# Patient Record
Sex: Female | Born: 1965
Health system: Southern US, Community
[De-identification: ages and names within clinical notes are randomized; demographics above are authoritative.]

## PROBLEM LIST (undated history)

## (undated) DIAGNOSIS — K219 Gastro-esophageal reflux disease without esophagitis: Secondary | ICD-10-CM

## (undated) DIAGNOSIS — G5601 Carpal tunnel syndrome, right upper limb: Secondary | ICD-10-CM

## (undated) DIAGNOSIS — M1990 Unspecified osteoarthritis, unspecified site: Secondary | ICD-10-CM

## (undated) DIAGNOSIS — L409 Psoriasis, unspecified: Secondary | ICD-10-CM

## (undated) DIAGNOSIS — S82851A Displaced trimalleolar fracture of right lower leg, initial encounter for closed fracture: Secondary | ICD-10-CM

## (undated) HISTORY — PX: CHOLECYSTECTOMY: SHX55

## (undated) HISTORY — PX: BREAST SURGERY: SHX581

## (undated) HISTORY — PX: BLADDER REPAIR: SHX76

## (undated) HISTORY — PX: CARPAL TUNNEL RELEASE: SHX101

## (undated) HISTORY — PX: ABDOMINAL HYSTERECTOMY: SHX81

---

## 2011-03-20 ENCOUNTER — Encounter: Payer: Self-pay | Admitting: *Deleted

## 2011-03-20 ENCOUNTER — Emergency Department (HOSPITAL_BASED_OUTPATIENT_CLINIC_OR_DEPARTMENT_OTHER)
Admission: EM | Admit: 2011-03-20 | Discharge: 2011-03-20 | Disposition: A | Discharge status: Home / Self Care | Payer: 59 | Attending: Emergency Medicine | Admitting: Emergency Medicine

## 2011-03-20 DIAGNOSIS — R319 Hematuria, unspecified: Secondary | ICD-10-CM | POA: Insufficient documentation

## 2011-03-20 DIAGNOSIS — M549 Dorsalgia, unspecified: Secondary | ICD-10-CM

## 2011-03-20 LAB — URINALYSIS, ROUTINE W REFLEX MICROSCOPIC
Glucose, UA: NEGATIVE mg/dL
Ketones, ur: NEGATIVE mg/dL
Leukocytes, UA: NEGATIVE
Protein, ur: NEGATIVE mg/dL
pH: 6 (ref 5.0–8.0)

## 2011-03-20 LAB — URINE MICROSCOPIC-ADD ON

## 2011-03-20 MED ORDER — OXYCODONE-ACETAMINOPHEN 5-325 MG PO TABS: 1.0000 | ORAL_TABLET | ORAL | Status: AC | PRN

## 2011-03-20 NOTE — ED Provider Notes (Signed)
History     CSN: [id number] Arrival date & time: 03/20/2011  2:37 PM  Chief Complaint  Patient presents with  . Back Pain   Patient is a 45 y.o. female presenting with back pain. The history is provided by the patient.  Back Pain  This is a chronic problem. Episode onset: 5 months ago. The problem occurs constantly. The problem has been gradually worsening. The pain is associated with no known injury. The pain is present in the lumbar spine. The quality of the pain is described as aching and shooting. The pain radiates to the right thigh. The pain is moderate. The symptoms are aggravated by bending and certain positions. The pain is worse during the night. Stiffness is present in the morning. Pertinent negatives include no fever, no numbness, no leg pain, no paresthesias, no paresis, no tingling and no weakness. She has tried NSAIDs for the symptoms. The treatment provided no relief.   Pt reports she had a recent MRI ordered by a spine surgeon, was told her symptoms were "arthritis" and was recommended to do PT. Pt has not. Reports hematuria noted by her ob gyn. No urinary frequency or urgency. No fever or chills. Now bowel complaints. No numbness No weakness of legs. Able to ambulate.  History reviewed. No pertinent past medical history.  Past Surgical History  Procedure Date  . Abdominal hysterectomy   . Breast surgery   . Bladder repair     History reviewed. No pertinent family history.  History  Substance Use Topics  . Smoking status: Never Smoker   . Smokeless tobacco: Not on file  . Alcohol Use: No    OB History    Grav Para Term Preterm Abortions TAB SAB Ect Mult Living                  Review of Systems  Constitutional: Negative for fever.  Musculoskeletal: Positive for back pain.  Neurological: Negative for tingling, weakness, numbness and paresthesias.  All other systems reviewed and are negative.    Physical Exam  BP 149/89  Pulse 71  Temp(Src) 98.3 F (36.8  C) (Oral)  Resp 16  Wt 246 lb (111.585 kg)  SpO2 98%  Physical Exam  Nursing note and vitals reviewed. Constitutional: She is oriented to person, place, and time. She appears well-developed and well-nourished. No distress.  HENT:  Head: Normocephalic and atraumatic.  Eyes: EOM are normal.  Neck: Normal range of motion.  Cardiovascular: Normal rate, regular rhythm and normal heart sounds.   Pulmonary/Chest: Effort normal and breath sounds normal.  Abdominal: Soft. She exhibits no distension. There is no tenderness.  Musculoskeletal: Normal range of motion.  Neurological: She is alert and oriented to person, place, and time.       Normal strength in bilateral lower extremities with normal patellar and ankle reflexes   Skin: Skin is warm and dry.  Psychiatric: She has a normal mood and affect. Judgment normal.    ED Course  Procedures  MDM Normal lower extremity neurologic exam. No bowel or bladder complaints. No back pain red flags. Likely musculoskeletal back pain vs sciatica vs arthritis. Doubt spinal epidural abscess. Doubt cauda equina. Doubt abdominal aortic aneurysm. Will check urine given some hematuria. Doubt ureteral colic x 5 months. Likely home with spine surgery referral. Also follow up with urology given persistent hematuria   Results for orders placed during the hospital encounter of 03/20/11  URINALYSIS, ROUTINE W REFLEX MICROSCOPIC      Component Value  Range   Color, Urine YELLOW  YELLOW    Appearance CLEAR  CLEAR    Specific Gravity, Urine 1.022  1.005 - 1.030    pH 6.0  5.0 - 8.0    Glucose, UA NEGATIVE  NEGATIVE (mg/dL)   Hgb urine dipstick LARGE (*) NEGATIVE    Bilirubin Urine NEGATIVE  NEGATIVE    Ketones, ur NEGATIVE  NEGATIVE (mg/dL)   Protein, ur NEGATIVE  NEGATIVE (mg/dL)   Urobilinogen, UA 0.2  0.0 - 1.0 (mg/dL)   Nitrite NEGATIVE  NEGATIVE    Leukocytes, UA NEGATIVE  NEGATIVE   URINE MICROSCOPIC-ADD ON      Component Value Range   Squamous  Epithelial / LPF RARE  RARE    RBC / HPF 11-20  <3 (RBC/hpf)   Bacteria, UA RARE  RARE      Lyanne Co, MD 03/20/11 1555

## 2011-03-20 NOTE — ED Notes (Signed)
Pt sts "I keep having reoccurring bladder infections and I have to go all the time". Pt sts she has "gained about 9lbs in a couple of weeks and feels bloated today". Pt sts she had a MRI in April of back and told she has "arthritis". Pt sts she recently had an abdominal ultrasound and sts it was "normal".

## 2011-03-20 NOTE — ED Notes (Signed)
Pt c/o lower back pain x 5 months. MRI dx arthritis. Pt states lower back pain which radiates into right leg.

## 2012-09-17 DIAGNOSIS — Z0000 Encounter for general adult medical examination without abnormal findings: Secondary | ICD-10-CM | POA: Insufficient documentation

## 2016-09-30 ENCOUNTER — Encounter: Payer: Self-pay | Admitting: Gastroenterology

## 2016-10-02 DIAGNOSIS — Z8371 Family history of colonic polyps: Secondary | ICD-10-CM | POA: Insufficient documentation

## 2016-11-04 ENCOUNTER — Ambulatory Visit: Payer: Self-pay | Admitting: Gastroenterology

## 2017-03-26 DIAGNOSIS — K8020 Calculus of gallbladder without cholecystitis without obstruction: Secondary | ICD-10-CM | POA: Insufficient documentation

## 2017-03-31 DIAGNOSIS — Z9049 Acquired absence of other specified parts of digestive tract: Secondary | ICD-10-CM | POA: Insufficient documentation

## 2017-03-31 DIAGNOSIS — R109 Unspecified abdominal pain: Secondary | ICD-10-CM | POA: Insufficient documentation

## 2017-07-23 ENCOUNTER — Other Ambulatory Visit: Payer: Self-pay

## 2017-07-23 ENCOUNTER — Emergency Department (HOSPITAL_BASED_OUTPATIENT_CLINIC_OR_DEPARTMENT_OTHER)
Admission: EM | Admit: 2017-07-23 | Discharge: 2017-07-23 | Disposition: A | Discharge status: Home / Self Care | Payer: Self-pay | Attending: Emergency Medicine | Admitting: Emergency Medicine

## 2017-07-23 ENCOUNTER — Ambulatory Visit: Payer: Self-pay | Admitting: *Deleted

## 2017-07-23 ENCOUNTER — Encounter (HOSPITAL_BASED_OUTPATIENT_CLINIC_OR_DEPARTMENT_OTHER): Payer: Self-pay

## 2017-07-23 DIAGNOSIS — Z91040 Latex allergy status: Secondary | ICD-10-CM | POA: Insufficient documentation

## 2017-07-23 DIAGNOSIS — T7840XA Allergy, unspecified, initial encounter: Secondary | ICD-10-CM

## 2017-07-23 DIAGNOSIS — Z79899 Other long term (current) drug therapy: Secondary | ICD-10-CM | POA: Insufficient documentation

## 2017-07-23 HISTORY — DX: Psoriasis, unspecified: L40.9

## 2017-07-23 HISTORY — DX: Unspecified osteoarthritis, unspecified site: M19.90

## 2017-07-23 MED ORDER — PREDNISONE 20 MG PO TABS: 40.0000 mg | ORAL_TABLET | Freq: Every day | ORAL | 0 refills | Status: AC

## 2017-07-23 MED ORDER — PREDNISONE 10 MG PO TABS
60.0000 mg | ORAL_TABLET | Freq: Once | ORAL | Status: AC
  Administered 2017-07-23: 60 mg via ORAL
  Filled 2017-07-23: qty 1

## 2017-07-23 NOTE — Discharge Instructions (Signed)
Please read and follow all provided instructions.  Your diagnoses today include:  1. Allergic reaction, initial encounter     Tests performed today include: Vital signs. See below for your results today.   Medications prescribed:  Take as prescribed   Home care instructions:  Follow any educational materials contained in this packet.  Follow-up instructions: Please follow-up with your Dermatology provider for further evaluation of symptoms and treatment   Return instructions:  Please return to the Emergency Department if you do not get better, if you get worse, or new symptoms OR  - Fever (temperature greater than 101.4F)  - Bleeding that does not stop with holding pressure to the area    -Severe pain (please note that you may be more sore the day after your accident)  - Chest Pain  - Difficulty breathing  - Severe nausea or vomiting  - Inability to tolerate food and liquids  - Passing out  - Skin becoming red around your wounds  - Change in mental status (confusion or lethargy)  - New numbness or weakness    Please return if you have any other emergent concerns.  Additional Information:  Your vital signs today were: BP (!) 139/100 (BP Location: Right Arm)    Pulse 79    Temp 98.4 F (36.9 C) (Oral)    Resp 16    Ht 5\' 8"  (1.727 m)    Wt 107 kg (236 lb)    SpO2 100%    BMI 35.88 kg/m  If your blood pressure (BP) was elevated above 135/85 this visit, please have this repeated by your doctor within one month. ---------------

## 2017-07-23 NOTE — Telephone Encounter (Signed)
Pt called stating that she an allergic reaction to propofol in past and since then she has had scalp problems and her hair fell out; on 07/22/17 pt colored her hair (roots only) with a ppd free hair color; now she has blisters that ore oozing clear fluid on her scalp; she has been going to [facility] for 6 months but has had no relief; pt states that her "scalp is reddened and covered wit oozing blisters and that her hair looks wet because it is oozing so much"; per the patient it started last night; nurse triage initiated; per protocol pt instructed to see physician within 24 hours; she states that she would like to establish care with [facility]; due to the severity of her reaction, pt instructed to go to ED for medical evaluation; pt verbalizes understanding and will proceed to ED for evaluation; she also states that she will call back to set up appointment to establish care.    Reason for Disposition . [1] Looks infected (spreading redness, pus) AND [2] no fever  Protocols used: RASH OR REDNESS - LOCALIZED-A-AH

## 2017-07-23 NOTE — ED Provider Notes (Signed)
[facility] EMERGENCY DEPARTMENT Provider Note   CSN: [id number] Arrival date & time: 07/23/17  1605     History   Chief Complaint Chief Complaint  Patient presents with  . Allergic Reaction    HPI Dawn Mcguire is a 51 y.o. female.  HPI  51 y.o. female , presents to the Emergency Department today due to allergic reaction. Pt states reaction on scalp x 2 days. Notes hx same. Previously on steroids for help. Sees Dermatologist. States that she tried new shampoo when symptoms began. Notes swelling and oozing around posterior scalp. No redness. No fevers. No N/V. No trouble breathing or swelling to throat. Denies pain. Mainly itching. Benadryl PRN. Sees Dermatologist soon. No other symptoms noted   Past Medical History:  Diagnosis Date  . Arthritis   . Psoriasis     There are no active problems to display for this patient.   Past Surgical History:  Procedure Laterality Date  . ABDOMINAL HYSTERECTOMY    . BLADDER REPAIR    . BREAST SURGERY    . CHOLECYSTECTOMY      OB History    No data available       Home Medications    Prior to Admission medications   Medication Sig Start Date End Date Taking? Authorizing Provider  BIOTIN PO Take by mouth.   Yes [provider]  naproxen sodium (ALEVE) 220 MG tablet Take 220 mg by mouth.   Yes [provider]  Multiple Vitamin (MULTIVITAMIN) tablet Take 1 tablet by mouth daily.      [provider]  omeprazole (PRILOSEC OTC) 20 MG tablet Take 20 mg by mouth daily.      [provider]  vitamin B-12 (CYANOCOBALAMIN) 1000 MCG tablet Take 1,000 mcg by mouth daily.      [provider]    Family History No family history on file.  Social History Social History   Tobacco Use  . Smoking status: Never Smoker  . Smokeless tobacco: Never Used  Substance Use Topics  . Alcohol use: No  . Drug use: No     Allergies   Latex; Nitrofurantoin monohyd macro; Sulfa  antibiotics; Corticosteroids; and Penicillins   Review of Systems Review of Systems ROS reviewed and all are negative for acute change except as noted in the HPI.  Physical Exam Updated Vital Signs BP (!) 161/119 (BP Location: Left Arm)   Pulse 80   Temp 98.4 F (36.9 C) (Oral)   Resp 18   Ht 5\' 8"  (1.727 m)   Wt 107 kg (236 lb)   SpO2 99%   BMI 35.88 kg/m   Physical Exam  Constitutional: She is oriented to person, place, and time. She appears well-developed and well-nourished. No distress.  HENT:  Head: Normocephalic and atraumatic.  Right Ear: Tympanic membrane, external ear and ear canal normal.  Left Ear: Tympanic membrane, external ear and ear canal normal.  Nose: Nose normal.  Mouth/Throat: Uvula is midline, oropharynx is clear and moist and mucous membranes are normal. No trismus in the jaw. No oropharyngeal exudate, posterior oropharyngeal erythema or tonsillar abscesses.  Posterior scalp with mild swelling and urticarial rash. No signs of infection.   Eyes: EOM are normal. Pupils are equal, round, and reactive to light.  Neck: Normal range of motion. Neck supple. No tracheal deviation present.  Cardiovascular: Normal rate, regular rhythm, S1 normal, S2 normal, normal heart sounds, intact distal pulses and normal pulses.  Pulmonary/Chest: Effort normal and breath sounds normal.  No respiratory distress. She has no decreased breath sounds. She has no wheezes. She has no rhonchi. She has no rales.  Abdominal: Normal appearance and bowel sounds are normal. There is no tenderness.  Musculoskeletal: Normal range of motion.  Neurological: She is alert and oriented to person, place, and time.  Skin: Skin is warm and dry.  Psychiatric: She has a normal mood and affect. Her speech is normal and behavior is normal. Thought content normal.  Nursing note and vitals reviewed.    ED Treatments / Results  Labs (all labs ordered are listed, but only abnormal results are  displayed) Labs Reviewed - No data to display  EKG  EKG Interpretation None       Radiology No results found.  Procedures Procedures (including critical care time)  Medications Ordered in ED Medications - No data to display   Initial Impression / Assessment and Plan / ED Course  I have reviewed the triage vital signs and the nursing notes.  Pertinent labs & imaging results that were available during my care of the patient were reviewed by me and considered in my medical decision making (see chart for details).  Final Clinical Impressions(s) / ED Diagnoses     {I have reviewed the relevant previous healthcare records.  {I obtained HPI from historian.   ED Course:  Assessment: Pt is a 51 y.o. female presents to the Emergency Department today due to allergic reaction. Pt states reaction on scalp x 2 days. Notes hx same. Previously on steroids for help. Sees Dermatologist. States that she tried new shampoo when symptoms began. Notes swelling and oozing around posterior scalp. No redness. No fevers. No N/V. No trouble breathing or swelling to throat. Denies pain. Mainly itching. Benadryl PRN. Sees Dermatologist soon. On exam, pt in NAD. Nontoxic/nonseptic appearing. VSS. Afebrile. Lungs CTA. Heart RRR. urticarial rash on scalp. No sign of infection. Likely from new shampoo. Given prednisone in ED. Will trial steroids and follow up to Dermatologist. Plan is to DC home. At time of discharge, Patient is in no acute distress. Vital Signs are stable. Patient is able to ambulate. Patient able to tolerate PO.   Disposition/Plan:  DC Home Additional Verbal discharge instructions given and discussed with patient.  Pt Instructed to f/u with Derm in the next week for evaluation and treatment of symptoms. Return precautions given Pt acknowledges and agrees with plan  Supervising Physician Charlesetta Shanks, MD  Final diagnoses:  Allergic reaction, initial encounter    ED Discharge Orders     None       Shary Decamp, PA-C 07/23/17 1856    Charlesetta Shanks, MD 07/23/17 [phone]

## 2017-07-23 NOTE — ED Notes (Signed)
ED Provider at bedside. 

## 2017-07-23 NOTE — ED Triage Notes (Signed)
Pt sates she has been having allergic reaction to scalp since Feb 2018-outbreak to area x 2 days having coloring hair-NAD-steady gait

## 2017-07-31 DIAGNOSIS — N390 Urinary tract infection, site not specified: Secondary | ICD-10-CM | POA: Insufficient documentation

## 2017-07-31 DIAGNOSIS — R319 Hematuria, unspecified: Secondary | ICD-10-CM | POA: Diagnosis not present

## 2017-07-31 DIAGNOSIS — R300 Dysuria: Secondary | ICD-10-CM | POA: Insufficient documentation

## 2017-08-06 ENCOUNTER — Encounter: Payer: Self-pay | Admitting: Family Medicine

## 2017-08-06 ENCOUNTER — Ambulatory Visit: Payer: BLUE CROSS/BLUE SHIELD | Admitting: Family Medicine

## 2017-08-06 VITALS — BP 128/80 | HR 94 | Temp 99.0°F | Ht 68.0 in | Wt 258.0 lb

## 2017-08-06 DIAGNOSIS — M19042 Primary osteoarthritis, left hand: Secondary | ICD-10-CM

## 2017-08-06 DIAGNOSIS — K432 Incisional hernia without obstruction or gangrene: Secondary | ICD-10-CM | POA: Diagnosis not present

## 2017-08-06 DIAGNOSIS — M722 Plantar fascial fibromatosis: Secondary | ICD-10-CM | POA: Diagnosis not present

## 2017-08-06 DIAGNOSIS — M19041 Primary osteoarthritis, right hand: Secondary | ICD-10-CM

## 2017-08-06 DIAGNOSIS — L219 Seborrheic dermatitis, unspecified: Secondary | ICD-10-CM

## 2017-08-06 DIAGNOSIS — M5136 Other intervertebral disc degeneration, lumbar region: Secondary | ICD-10-CM | POA: Diagnosis not present

## 2017-08-06 DIAGNOSIS — Z0000 Encounter for general adult medical examination without abnormal findings: Secondary | ICD-10-CM

## 2017-08-06 NOTE — Progress Notes (Signed)
Subjective:  Patient ID: Dawn Mcguire, female    DOB: 1965-12-17  Age: 52 y.o. MRN: [id number]  CC: Establish Care   HPI Dawn Mcguire presents for establishment of care and for evaluation of multiple issues.  Dawn Mcguire tells of a scaling itching rash in her scalp since Dawn Mcguire was given propofol for a cholecystectomy 5 months ago.  Dawn Mcguire has tried multiple shampoos and creams for this without much relief.  Dawn Mcguire is allergic to sulfur.  Dawn Mcguire had a laparoscopic cholecystectomy as mentioned above and has had a bulge at the point of the incision sites just above her umbilicus since that surgery.  Dawn Mcguire also has a history of osteoarthritis involving her hands, degenerative disc disease in her lower back.  Dawn Mcguire also has dealt with plantar fasciitis in the past.  And orthopedist injected her plantar fascia in the distant past and this seemed to help a lot.  Dawn Mcguire works in a [employer] and stands all day.  Dawn Mcguire has been taking Aleve with relief of her symptoms.  History Dawn Mcguire has a past medical history of Arthritis and Psoriasis.   Dawn Mcguire has a past surgical history that includes Abdominal hysterectomy; Breast surgery; Bladder repair; and Cholecystectomy.   Her family history includes Arthritis in her brother, brother, brother, and mother; Cancer in her father; Diabetes in her father and son; Heart attack in her mother; High blood pressure in her mother.Dawn Mcguire reports that  has never smoked. Dawn Mcguire has never used smokeless tobacco. Dawn Mcguire reports that Dawn Mcguire does not drink alcohol or use drugs.  Outpatient Medications Prior to Visit  Medication Sig Dispense Refill  . BIOTIN PO Take by mouth.    . Multiple Vitamin (MULTIVITAMIN) tablet Take 1 tablet by mouth daily.      . naproxen sodium (ALEVE) 220 MG tablet Take 220 mg by mouth.    . vitamin B-12 (CYANOCOBALAMIN) 1000 MCG tablet Take 1,000 mcg by mouth daily.      Marland Kitchen omeprazole (PRILOSEC OTC) 20 MG tablet Take 20 mg by mouth daily.       No facility-administered medications  prior to visit.     ROS Review of Systems  Constitutional: Negative.   HENT: Negative.   Eyes: Negative.   Respiratory: Negative.   Cardiovascular: Negative.   Gastrointestinal: Negative.   Endocrine: Negative for polyphagia and polyuria.  Genitourinary: Negative.   Musculoskeletal: Positive for arthralgias, back pain and gait problem. Negative for myalgias.  Skin: Positive for rash. Negative for pallor and wound.  Allergic/Immunologic: Negative for immunocompromised state.  Neurological: Negative for weakness and headaches.  Hematological: Negative.  Does not bruise/bleed easily.  Psychiatric/Behavioral: Negative.     Objective:  BP 128/80 (BP Location: Left Arm, Patient Position: Sitting, Cuff Size: Large)   Pulse 94   Temp 99 F (37.2 C) (Oral)   Ht 5\' 8"  (1.727 m)   Wt 258 lb (117 kg)   SpO2 96%   BMI 39.23 kg/m   Physical Exam  Constitutional: Dawn Mcguire is oriented to person, place, and time. Dawn Mcguire appears well-developed and well-nourished. No distress.  HENT:  Head: Normocephalic and atraumatic.  Right Ear: External ear normal.  Left Ear: External ear normal.  Mouth/Throat: Oropharynx is clear and moist. No oropharyngeal exudate.  Eyes: Conjunctivae are normal. Pupils are equal, round, and reactive to light. Right eye exhibits no discharge. Left eye exhibits no discharge. No scleral icterus.  Neck: Neck supple. No JVD present. No tracheal deviation present. No thyromegaly present.  Cardiovascular: Normal rate, regular rhythm and  normal heart sounds.  Pulmonary/Chest: Effort normal and breath sounds normal. No stridor.  Abdominal: Soft. Bowel sounds are normal.  There are multiple well-healed surgical incisions incisions that are consistent with a past medical history of laparoscopic cholecystectomy.  There is a 2-3 cm incision just above the umbilicus.  I was not certain whether or not that I was actually feeling of bulge around this incision with Valsalva.    Lymphadenopathy:    Dawn Mcguire has no cervical adenopathy.  Neurological: Dawn Mcguire is alert and oriented to person, place, and time.  Skin: Skin is warm and dry. Dawn Mcguire is not diaphoretic.  There is a global fine scaling rash covering the patient's entire scalp.  Psychiatric: Dawn Mcguire has a normal mood and affect.      Assessment & Plan:   Dawn Mcguire was seen today for establish care.  Diagnoses and all orders for this visit:  Seborrheic eczema of scalp -     Ambulatory referral to Dermatology  Incisional hernia, without obstruction or gangrene -     Ambulatory referral to General Surgery  Primary osteoarthritis of both hands  Plantar fasciitis -     Ambulatory referral to [place] maintenance -     CBC; Future -     Comprehensive metabolic panel; Future -     Hepatitis C antibody; Future -     HIV antibody; Future -     Lipid panel; Future -     TSH; Future -     Urinalysis, Routine w reflex microscopic; Future  Degenerative disc disease, lumbar -     Ambulatory referral to Sports Medicine   I have discontinued Dawn Mcguire's omeprazole. I am also having her maintain her multivitamin, vitamin B-12, BIOTIN PO, and naproxen sodium.  No orders of the defined types were placed in this encounter.  I recommended that the patient try [facility] shampoo for her seborrheic dermatitis.  Dawn Mcguire will continue the Aleve as needed.  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  We also had a frank discussion about her weight and its effect on her lower back and feet.  Dawn Mcguire will return for the above order ordered fasting lab work.  Follow-up: No Follow-up on file.  Libby Maw, MD

## 2017-08-07 ENCOUNTER — Encounter: Payer: Self-pay | Admitting: Family Medicine

## 2017-08-13 ENCOUNTER — Ambulatory Visit: Payer: BLUE CROSS/BLUE SHIELD | Admitting: Family Medicine

## 2017-08-13 ENCOUNTER — Other Ambulatory Visit: Payer: BLUE CROSS/BLUE SHIELD

## 2017-08-18 ENCOUNTER — Ambulatory Visit: Payer: 59 | Admitting: Nurse Practitioner

## 2017-08-18 ENCOUNTER — Other Ambulatory Visit: Payer: Self-pay | Admitting: Surgery

## 2017-08-18 DIAGNOSIS — K432 Incisional hernia without obstruction or gangrene: Secondary | ICD-10-CM | POA: Diagnosis not present

## 2017-08-26 ENCOUNTER — Ambulatory Visit
Admission: RE | Admit: 2017-08-26 | Discharge: 2017-08-26 | Disposition: A | Discharge status: Home / Self Care | Payer: BLUE CROSS/BLUE SHIELD | Source: Ambulatory Visit | Attending: Surgery | Admitting: Surgery

## 2017-08-26 ENCOUNTER — Other Ambulatory Visit: Payer: Self-pay | Admitting: Surgery

## 2017-08-26 DIAGNOSIS — K432 Incisional hernia without obstruction or gangrene: Secondary | ICD-10-CM

## 2017-08-26 DIAGNOSIS — K439 Ventral hernia without obstruction or gangrene: Secondary | ICD-10-CM | POA: Diagnosis not present

## 2017-08-26 MED ORDER — IOPAMIDOL (ISOVUE-300) INJECTION 61%
125.0000 mL | Freq: Once | INTRAVENOUS | Status: AC | PRN
  Administered 2017-08-26: 125 mL via INTRAVENOUS

## 2017-09-01 ENCOUNTER — Ambulatory Visit: Payer: Self-pay | Admitting: Surgery

## 2017-09-01 NOTE — H&P (Signed)
History of Present Illness Dawn Mcguire. Dawn Bribiesca MD; 08/18/2017 1:49 PM) The patient is a 52 year old female who presents with an incisional hernia. Referred by Dr. Abelino Mcguire for ventral incisional hernia  This is a 52 year old female who is status post laparoscopic cholecystectomy on 03/26/17. The surgery was performed at [facility] by Dr. Margurite Mcguire. Upon reviewing his operative note, there did not seem to be any unusual findings at the time of surgery. Pathology confirmed chronic calculus cholecystitis with a single gallstone measuring 2.7 cm. The patient was kept overnight and was discharged on postoperative day #1. Apparently, the patient had significant postoperative pain management issues. She was readmitted to the hospital on 03/31/17 which was postoperative day #5 with low-grade fever and leukocytosis. CT scan showed a fluid collection in the gallbladder fossa. This did not seem to be definitive for a bile leak but the patient was admitted to the hospital and placed on IV antibiotics. Her white count decreased. Her abdominal pain improved. She was discharged home on 04/02/17. Her abdominal pain has since resolved. However since surgery, she has developed a palpable bulge in her abdomen above her umbilicus. This has become larger and is causing some discomfort. The patient does not want to go back to see her original surgeon and she felt that they did not address her complaints when she pointed out the palpable bulge above her umbilicus. She had superficial wound infections with several sutures that had to be removed from her laparoscopic incisions. These have all healed. She denies any obstructive complaints. She has not had any imaging of this area. She did have a reaction that she believes was an allergic reaction to propofol.  The patient states that she does not want to have any more of her care in the [facility] regional system.  We obtained a CT scan that showed only  a single periumbilical ventral hernia. CLINICAL DATA:  Pain and tenderness, incisional hernia  EXAM: CT ABDOMEN AND PELVIS WITH CONTRAST  TECHNIQUE: Multidetector CT imaging of the abdomen and pelvis was performed using the standard protocol following bolus administration of intravenous contrast.  CONTRAST:  126mL ISOVUE-300 IOPAMIDOL (ISOVUE-300) INJECTION 61%  COMPARISON:  None.  FINDINGS: Lower chest: The lung bases are clear.  No effusion is seen.  Hepatobiliary: The liver enhances with no focal abnormality and no ductal dilatation is seen. Surgical clips are present from prior cholecystectomy.  Pancreas: Pancreas is normal in size and the pancreatic duct is not dilated.  Spleen: The spleen is unremarkable.  Adrenals/Urinary Tract: The adrenal glands appear normal. The kidneys enhance with no calculus or mass and on delayed images, the pelvocaliceal systems appear normal. The ureters are normal in caliber. The urinary bladder is not well distended but no abnormality is seen.  Stomach/Bowel: The stomach is moderately distended with oral contrast. No abnormality is noted. No small bowel distention is seen. Multiple rectosigmoid colon diverticula are noted. No diverticulitis is seen. Diverticula also are scattered throughout the more proximal colon. The terminal ileum and the appendix are unremarkable.  Vascular/Lymphatic: The abdominal aorta is normal in caliber. Only small retroperitoneal lymph nodes are present.  Reproductive: The uterus has previously been resected. No adnexal lesion is seen. No fluid is noted within the pelvis.  Other: There is a midline ventral hernia just above the umbilicus containing fat and a portion of the mid transverse colon without obstruction.  Musculoskeletal: The lumbar vertebrae are normal alignment with normal intervertebral disc spaces. Mild degenerative changes  noted at L4-5. The SI joints are well  corticated.  IMPRESSION: 1. Midline ventral hernia just above the umbilicus containing fat and a nondistended portion of the mid transverse colon. 2. Colonic diverticula primarily in the rectosigmoid colon.   Electronically Signed   By: Dawn Mcguire M.D.   On: 08/26/2017 13:48    Past Surgical History Dawn Mcguire, [place]; 08/18/2017 9:21 AM) Gallbladder Surgery - Laparoscopic  Gallbladder Surgery - Open  Hemorrhoidectomy  Hysterectomy (not due to cancer) - Partial  Mammoplasty; Reduction  Bilateral.  Diagnostic Studies History Dawn Mcguire, [place]; 08/18/2017 9:21 AM) Colonoscopy  within last year Mammogram  within last year Pap Smear  1-5 years ago  Allergies Dawn Mcguire, RMA; 08/18/2017 9:24 AM) Dawn Mcguire Drugs  Penicillins  OxyCODONE HCl (Abuse Deter) *ANALGESICS - OPIOID*  Propofol *GENERAL ANESTHETICS*  Macrobid *URINARY ANTI-INFECTIVES*  Cortisone *CORTICOSTEROIDS*  Allergies Reconciled   Medication History Dawn Mcguire, RMA; 08/18/2017 9:27 AM) TraMADol HCl (50MG  Tablet, Oral) Active. Cefdinir (300MG  Capsule, Oral) Active. MetroNIDAZOLE (500MG  Tablet, Oral) Active. Biotin (1MG  Capsule, Oral) Active. Vitamin B-12 (1000MCG Tablet, Oral) Active. Multi-Vitamin (Oral) Active. Naproxen (250MG  Tablet, Oral) Active. Medications Reconciled  Social History Dawn Mcguire, [place]; 08/18/2017 9:21 AM) Alcohol use  Occasional alcohol use. Caffeine use  Carbonated beverages, Tea. Illicit drug use  Remotely quit drug use. Tobacco use  Never smoker.  Family History Dawn Mcguire, [place]; 08/18/2017 9:21 AM) Arthritis  Mother. Cerebrovascular Accident  Father. Colon Cancer  Father. Colon Polyps  Brother. Diabetes Mellitus  Son. Rectal Cancer  Father.  Pregnancy / Birth History Dawn Mcguire, [place]; 08/18/2017 9:21 AM) Age at menarche  61 years. Age of menopause  23-50 Gravida  1 Irregular periods  Length (months) of breastfeeding   7-12 Maternal age  88-30 Para  1  Other Problems Dawn Mcguire, [place]; 08/18/2017 9:21 AM) Arthritis  Back Pain  Bladder Problems  Cholelithiasis  Gastroesophageal Reflux Disease  Kidney Stone  Umbilical Hernia Repair     Review of Systems Dawn Mcguire RMA; 08/18/2017 9:21 AM) General Present- Appetite Loss and Weight Gain. Not Present- Chills, Fatigue, Fever, Night Sweats and Weight Loss. Skin Present- Dryness and Rash. Not Present- Change in Wart/Mole, Hives, Jaundice, New Lesions, Non-Healing Wounds and Ulcer. HEENT Present- Seasonal Allergies and Wears glasses/contact lenses. Not Present- Earache, Hearing Loss, Hoarseness, Nose Bleed, Oral Ulcers, Ringing in the Ears, Sinus Pain, Sore Throat, Visual Disturbances and Yellow Eyes. Breast Not Present- Breast Mass, Breast Pain, Nipple Discharge and Skin Changes. Cardiovascular Not Present- Chest Pain, Difficulty Breathing Lying Down, Leg Cramps, Palpitations, Rapid Heart Rate, Shortness of Breath and Swelling of Extremities. Gastrointestinal Present- Abdominal Pain, Bloating, Change in Bowel Habits, Gets full quickly at meals and Hemorrhoids. Not Present- Bloody Stool, Chronic diarrhea, Constipation, Difficulty Swallowing, Excessive gas, Indigestion, Nausea, Rectal Pain and Vomiting. Female Genitourinary Present- Frequency and Urgency. Not Present- Nocturia, Painful Urination and Pelvic Pain. Musculoskeletal Present- Back Pain, Joint Pain and Swelling of Extremities. Not Present- Joint Stiffness, Muscle Pain and Muscle Weakness. Neurological Not Present- Decreased Memory, Fainting, Headaches, Numbness, Seizures, Tingling, Tremor, Trouble walking and Weakness. Psychiatric Not Present- Anxiety, Bipolar, Change in Sleep Pattern, Depression, Fearful and Frequent crying. Endocrine Present- Hair Changes. Not Present- Cold Intolerance, Excessive Hunger, Heat Intolerance, Hot flashes and New Diabetes. Hematology Not Present- Blood  Thinners, Easy Bruising, Excessive bleeding, Gland problems, HIV and Persistent Infections.  Vitals Mardene Celeste King RMA; 08/18/2017 9:23 AM) 08/18/2017 9:22 AM Weight: 259.4 lb Height: 68in Body Surface Area: 2.28 m Body Mass Index:  39.44 kg/m  Temp.: 98.45F  Pulse: 96 (Regular)  BP: 140/78 (Sitting, Left Arm, Standard)       Physical Exam Rodman Key K. Lanis Storlie MD; 08/18/2017 1:49 PM) The physical exam findings are as follows: Note:WDWN in NAD Eyes: Pupils equal, round; sclera anicteric HENT: Oral mucosa moist; good dentition Neck: No masses palpated, no thyromegaly Lungs: CTA bilaterally; normal respiratory effort CV: Regular rate and rhythm; no murmurs; extremities well-perfused with no edema Abd: +bowel sounds, soft, non-tender, no palpable organomegaly; well-healed laparoscopic incisions with no signs of infection Above the umbilical incision, there is a five cm palpable, visible mass; mildly tender; reducible when supine Skin: Warm, dry; no sign of jaundice Psychiatric - alert and oriented x 4; calm mood and affect    Assessment & Plan Rodman Key K. Ople Girgis MD; 08/18/2017 1:50 PM) VENTRAL INCISIONAL HERNIA WITHOUT OBSTRUCTION OR GANGRENE (K43.2) Current Plans  Open ventral hernia repair with mesh.  The surgical procedure has been discussed with the patient.  Potential risks, benefits, alternative treatments, and expected outcomes have been explained.  All of the patient's questions at this time have been answered.  The likelihood of reaching the patient's treatment goal is good.  The patient understand the proposed surgical procedure and wishes to proceed.   Dawn Mcguire. Georgette Dover, MD, [facility]  General/ Trauma Surgery  09/01/2017 11:39 AM

## 2017-09-09 DIAGNOSIS — M2550 Pain in unspecified joint: Secondary | ICD-10-CM | POA: Diagnosis not present

## 2017-09-09 DIAGNOSIS — L409 Psoriasis, unspecified: Secondary | ICD-10-CM | POA: Diagnosis not present

## 2017-09-09 DIAGNOSIS — L659 Nonscarring hair loss, unspecified: Secondary | ICD-10-CM | POA: Diagnosis not present

## 2017-09-09 DIAGNOSIS — D485 Neoplasm of uncertain behavior of skin: Secondary | ICD-10-CM | POA: Diagnosis not present

## 2017-09-09 DIAGNOSIS — L820 Inflamed seborrheic keratosis: Secondary | ICD-10-CM | POA: Diagnosis not present

## 2017-10-03 NOTE — Pre-Procedure Instructions (Signed)
Dawn Mcguire  10/03/2017      [place], [place] - [address] [place] [address] Phone: [phone] Fax: [phone]    Your procedure is scheduled on Thursday March 21.  Report to [facility] Admitting at 5:30 A.M.  Call this number if you have problems the morning of surgery:  [phone]   Remember:  Do not eat food or drink liquids after midnight.  **DRINK Ensure Pre-surgery drink prior to leaving home the morning of surgery.**   Take these medicines the morning of surgery with A SIP OF WATER: omeprazole (prilosec), Claritin (loratadine) if needed  7 days prior to surgery STOP taking any Aspirin(unless otherwise instructed by your surgeon), Aleve, Naproxen, Ibuprofen, Motrin, Advil, Goody's, BC's, all herbal medications, fish oil, and all vitamins    Do not wear jewelry, make-up or nail polish.  Do not wear lotions, powders, or perfumes, or deodorant.  Do not shave 48 hours prior to surgery.  Men may shave face and neck.  Do not bring valuables to the hospital.  [facility] is not responsible for any belongings or valuables.  Contacts, dentures or bridgework may not be worn into surgery.  Leave your suitcase in the car.  After surgery it may be brought to your room.  For patients admitted to the hospital, discharge time will be determined by your treatment team.  Patients discharged the day of surgery will not be allowed to drive home.   Special instructions:    [place]- Preparing For Surgery  Before surgery, you can play an important role. Because skin is not sterile, your skin needs to be as free of germs as possible. You can reduce the number of germs on your skin by washing with CHG (chlorahexidine gluconate) Soap before surgery.  CHG is an antiseptic cleaner which kills germs and bonds with the skin to continue killing germs even after washing.  Please do not use if you have an allergy to CHG or  antibacterial soaps. If your skin becomes reddened/irritated stop using the CHG.  Do not shave (including legs and underarms) for at least 48 hours prior to first CHG shower. It is OK to shave your face.  Please follow these instructions carefully.   1. Shower the NIGHT BEFORE SURGERY and the MORNING OF SURGERY with CHG.   2. If you chose to wash your hair, wash your hair first as usual with your normal shampoo.  3. After you shampoo, rinse your hair and body thoroughly to remove the shampoo.  4. Use CHG as you would any other liquid soap. You can apply CHG directly to the skin and wash gently with a scrungie or a clean washcloth.   5. Apply the CHG Soap to your body ONLY FROM THE NECK DOWN.  Do not use on open wounds or open sores. Avoid contact with your eyes, ears, mouth and genitals (private parts). Wash Face and genitals (private parts)  with your normal soap.  6. Wash thoroughly, paying special attention to the area where your surgery will be performed.  7. Thoroughly rinse your body with warm water from the neck down.  8. DO NOT shower/wash with your normal soap after using and rinsing off the CHG Soap.  9. Pat yourself dry with a CLEAN TOWEL.  10. Wear CLEAN PAJAMAS to bed the night before surgery, wear comfortable clothes the morning of surgery  11. Place CLEAN SHEETS on your bed the  night of your first shower and DO NOT SLEEP WITH PETS.    Day of Surgery: Do not apply any deodorants/lotions. Please wear clean clothes to the hospital/surgery center.      Please read over the following fact sheets that you were given. Coughing and Deep Breathing and Surgical Site Infection Prevention

## 2017-10-06 ENCOUNTER — Other Ambulatory Visit: Payer: Self-pay

## 2017-10-06 ENCOUNTER — Encounter (HOSPITAL_COMMUNITY)
Admission: RE | Admit: 2017-10-06 | Discharge: 2017-10-06 | Disposition: A | Discharge status: Home / Self Care | Payer: BLUE CROSS/BLUE SHIELD | Source: Ambulatory Visit | Attending: Surgery | Admitting: Surgery

## 2017-10-06 ENCOUNTER — Encounter (HOSPITAL_COMMUNITY): Payer: Self-pay

## 2017-10-06 DIAGNOSIS — K432 Incisional hernia without obstruction or gangrene: Secondary | ICD-10-CM | POA: Insufficient documentation

## 2017-10-06 DIAGNOSIS — Z01818 Encounter for other preprocedural examination: Secondary | ICD-10-CM | POA: Insufficient documentation

## 2017-10-06 HISTORY — DX: Gastro-esophageal reflux disease without esophagitis: K21.9

## 2017-10-06 LAB — BASIC METABOLIC PANEL
Anion gap: 10 (ref 5–15)
BUN: 9 mg/dL (ref 6–20)
CALCIUM: 8.8 mg/dL — AB (ref 8.9–10.3)
CO2: 23 mmol/L (ref 22–32)
CREATININE: 0.72 mg/dL (ref 0.44–1.00)
Chloride: 107 mmol/L (ref 101–111)
Glucose, Bld: 93 mg/dL (ref 65–99)
Potassium: 3.9 mmol/L (ref 3.5–5.1)
SODIUM: 140 mmol/L (ref 135–145)

## 2017-10-06 LAB — CBC
HCT: 45 % (ref 36.0–46.0)
Hemoglobin: 14.5 g/dL (ref 12.0–15.0)
MCH: 29.7 pg (ref 26.0–34.0)
MCHC: 32.2 g/dL (ref 30.0–36.0)
MCV: 92.2 fL (ref 78.0–100.0)
PLATELETS: 248 10*3/uL (ref 150–400)
RBC: 4.88 MIL/uL (ref 3.87–5.11)
RDW: 13.1 % (ref 11.5–15.5)
WBC: 8.8 10*3/uL (ref 4.0–10.5)

## 2017-10-06 NOTE — Pre-Procedure Instructions (Signed)
Dawn Mcguire  10/06/2017      [place], [place] - [address] [place] [address] Phone: [phone] Fax: [phone]    Your procedure is scheduled on Thursday March 21.   Report to [facility] Admitting at 5:30 A.M.             (posted surgery time 7:30a - 9a)   Call this number if you have problems the morning of surgery:  [phone]   Remember:   Do not eat food or drink liquids after midnight, Wednesday.  **DRINK Ensure Pre-surgery drink prior to leaving home the morning of surgery.**   Take these medicines the morning of surgery with A SIP OF WATER: omeprazole (prilosec), Claritin (loratadine) if needed  7 days prior to surgery STOP taking any Aspirin(unless otherwise instructed by your surgeon), Aleve, Naproxen, Ibuprofen, Motrin, Advil, Goody's, BC's, all herbal medications, fish oil, and all vitamins    Do not wear jewelry, make-up or nail polish.  Do not wear lotions, powders, or perfumes, or deodorant.  Do not shave 48 hours prior to surgery.  Do not bring valuables to the hospital.  [facility] is not responsible for any belongings or valuables.  Contacts, dentures or bridgework may not be worn into surgery.  Leave your suitcase in the car.  After surgery it may be brought to your room.  For patients admitted to the hospital, discharge time will be determined by your treatment team.  Patients discharged the day of surgery will not be allowed to drive home.   Special instructions:    [place]- Preparing For Surgery  Before surgery, you can play an important role. Because skin is not sterile, your skin needs to be as free of germs as possible. You can reduce the number of germs on your skin by washing with CHG (chlorahexidine gluconate) Soap before surgery.  CHG is an antiseptic cleaner which kills germs and bonds with the skin to continue killing germs even after washing.  Please do not  use if you have an allergy to CHG or antibacterial soaps. If your skin becomes reddened/irritated stop using the CHG.  Do not shave (including legs and underarms) for at least 48 hours prior to first CHG shower. It is OK to shave your face.  Please follow these instructions carefully.   1. Shower the NIGHT BEFORE SURGERY and the MORNING OF SURGERY with CHG.   2. If you chose to wash your hair, wash your hair first as usual with your normal shampoo.  3. After you shampoo, rinse your hair and body thoroughly to remove the shampoo.  4. Use CHG as you would any other liquid soap. You can apply CHG directly to the skin and wash gently with a scrungie or a clean washcloth.   5. Apply the CHG Soap to your body ONLY FROM THE NECK DOWN.  Do not use on open wounds or open sores. Avoid contact with your eyes, ears, mouth and genitals (private parts). Wash Face and genitals (private parts)  with your normal soap.  6. Wash thoroughly, paying special attention to the area where your surgery will be performed.  7. Thoroughly rinse your body with warm water from the neck down.  8. DO NOT shower/wash with your normal soap after using and rinsing off the CHG Soap.  9. Pat yourself dry with a CLEAN TOWEL.  10. Wear CLEAN PAJAMAS to bed the night before surgery, wear  comfortable clothes the morning of surgery  11. Place CLEAN SHEETS on your bed the night of your first shower and DO NOT SLEEP WITH PETS.    Day of Surgery: Do not apply any deodorants/lotions. Please wear clean clothes to the hospital/surgery center.      Please read over the following fact sheets that you were given. Coughing and Deep Breathing and Surgical Site Infection Prevention

## 2017-10-06 NOTE — Progress Notes (Addendum)
PCP is Dr. Ethelene Hal  [place] 07/2017 She states that prior to her gallbladder surgery, she was told she had "slight" murmur.  No further w/u or studies.  Denies CP, SOB, Palpitations. No studies have been done.  Has not been sent to a cardio.

## 2017-10-15 ENCOUNTER — Encounter (HOSPITAL_COMMUNITY): Payer: Self-pay | Admitting: Anesthesiology

## 2017-10-15 NOTE — Anesthesia Preprocedure Evaluation (Addendum)
Anesthesia Evaluation  Patient identified by MRN, date of birth, ID band Patient awake    Reviewed: Allergy & Precautions, NPO status , Patient's Chart, lab work & pertinent test results  Airway Mallampati: II       Dental no notable dental hx. (+) Teeth Intact   Pulmonary neg pulmonary ROS,    Pulmonary exam normal breath sounds clear to auscultation       Cardiovascular negative cardio ROS Normal cardiovascular exam Rhythm:Regular Rate:Normal     Neuro/Psych negative psych ROS   GI/Hepatic GERD  Medicated,  Endo/Other  Morbid obesity  Renal/GU      Musculoskeletal   Abdominal (+) + obese,   Peds  Hematology negative hematology ROS (+)   Anesthesia Other Findings   Reproductive/Obstetrics                            Anesthesia Physical Anesthesia Plan  ASA: III  Anesthesia Plan: General   Post-op Pain Management:    Induction: Intravenous  PONV Risk Score and Plan: 4 or greater and Ondansetron, Dexamethasone, Scopolamine patch - Pre-op and Midazolam  Airway Management Planned: Oral ETT  Additional Equipment:   Intra-op Plan:   Post-operative Plan: Extubation in OR  Informed Consent: I have reviewed the patients History and Physical, chart, labs and discussed the procedure including the risks, benefits and alternatives for the proposed anesthesia with the patient or authorized representative who has indicated his/her understanding and acceptance.   Dental advisory given  Plan Discussed with: CRNA and Surgeon  Anesthesia Plan Comments:        Anesthesia Quick Evaluation

## 2017-10-16 ENCOUNTER — Ambulatory Visit (HOSPITAL_COMMUNITY): Payer: BLUE CROSS/BLUE SHIELD | Admitting: Certified Registered Nurse Anesthetist

## 2017-10-16 ENCOUNTER — Encounter (HOSPITAL_COMMUNITY): Payer: Self-pay | Admitting: *Deleted

## 2017-10-16 ENCOUNTER — Encounter (HOSPITAL_COMMUNITY)
Admission: RE | Disposition: A | Discharge status: Home / Self Care | Payer: Self-pay | Source: Ambulatory Visit | Attending: Surgery

## 2017-10-16 ENCOUNTER — Ambulatory Visit (HOSPITAL_COMMUNITY)
Admission: RE | Admit: 2017-10-16 | Discharge: 2017-10-16 | Disposition: A | Discharge status: Home / Self Care | Payer: BLUE CROSS/BLUE SHIELD | Source: Ambulatory Visit | Attending: Surgery | Admitting: Surgery

## 2017-10-16 DIAGNOSIS — Z79899 Other long term (current) drug therapy: Secondary | ICD-10-CM | POA: Diagnosis not present

## 2017-10-16 DIAGNOSIS — Z881 Allergy status to other antibiotic agents status: Secondary | ICD-10-CM | POA: Insufficient documentation

## 2017-10-16 DIAGNOSIS — Z882 Allergy status to sulfonamides status: Secondary | ICD-10-CM | POA: Diagnosis not present

## 2017-10-16 DIAGNOSIS — Z91040 Latex allergy status: Secondary | ICD-10-CM | POA: Insufficient documentation

## 2017-10-16 DIAGNOSIS — K5730 Diverticulosis of large intestine without perforation or abscess without bleeding: Secondary | ICD-10-CM | POA: Insufficient documentation

## 2017-10-16 DIAGNOSIS — Z886 Allergy status to analgesic agent status: Secondary | ICD-10-CM | POA: Insufficient documentation

## 2017-10-16 DIAGNOSIS — Z6838 Body mass index (BMI) 38.0-38.9, adult: Secondary | ICD-10-CM | POA: Insufficient documentation

## 2017-10-16 DIAGNOSIS — Z880 Allergy status to penicillin: Secondary | ICD-10-CM | POA: Insufficient documentation

## 2017-10-16 DIAGNOSIS — K432 Incisional hernia without obstruction or gangrene: Secondary | ICD-10-CM | POA: Diagnosis not present

## 2017-10-16 DIAGNOSIS — Z885 Allergy status to narcotic agent status: Secondary | ICD-10-CM | POA: Insufficient documentation

## 2017-10-16 DIAGNOSIS — K219 Gastro-esophageal reflux disease without esophagitis: Secondary | ICD-10-CM | POA: Insufficient documentation

## 2017-10-16 DIAGNOSIS — K439 Ventral hernia without obstruction or gangrene: Secondary | ICD-10-CM | POA: Diagnosis not present

## 2017-10-16 HISTORY — PX: INSERTION OF MESH: SHX5868

## 2017-10-16 HISTORY — PX: VENTRAL HERNIA REPAIR: SHX424

## 2017-10-16 SURGERY — REPAIR, HERNIA, VENTRAL
Anesthesia: General | Site: Abdomen

## 2017-10-16 MED ORDER — HYDROMORPHONE HCL 1 MG/ML IJ SOLN
INTRAMUSCULAR | Status: AC
  Filled 2017-10-16: qty 1

## 2017-10-16 MED ORDER — SCOPOLAMINE 1 MG/3DAYS TD PT72
MEDICATED_PATCH | TRANSDERMAL | Status: DC | PRN
  Administered 2017-10-16: 1 via TRANSDERMAL

## 2017-10-16 MED ORDER — ROCURONIUM BROMIDE 100 MG/10ML IV SOLN
INTRAVENOUS | Status: DC | PRN
  Administered 2017-10-16: 40 mg via INTRAVENOUS
  Administered 2017-10-16: 10 mg via INTRAVENOUS

## 2017-10-16 MED ORDER — CHLORHEXIDINE GLUCONATE CLOTH 2 % EX PADS: 6.0000 | MEDICATED_PAD | Freq: Once | CUTANEOUS | Status: DC

## 2017-10-16 MED ORDER — OXYCODONE HCL 5 MG PO TABS
5.0000 mg | ORAL_TABLET | Freq: Once | ORAL | Status: AC
  Administered 2017-10-16: 5 mg via ORAL

## 2017-10-16 MED ORDER — ACETAMINOPHEN 10 MG/ML IV SOLN
INTRAVENOUS | Status: AC
  Filled 2017-10-16: qty 100

## 2017-10-16 MED ORDER — PROPOFOL 10 MG/ML IV BOLUS
INTRAVENOUS | Status: DC | PRN
  Administered 2017-10-16: 100 mg via INTRAVENOUS

## 2017-10-16 MED ORDER — OXYCODONE HCL 5 MG PO TABS
ORAL_TABLET | ORAL | Status: AC
  Filled 2017-10-16: qty 1

## 2017-10-16 MED ORDER — OXYCODONE HCL 5 MG PO TABS: 5.0000 mg | ORAL_TABLET | Freq: Four times a day (QID) | ORAL | 0 refills | Status: DC | PRN

## 2017-10-16 MED ORDER — CEFAZOLIN SODIUM-DEXTROSE 2-4 GM/100ML-% IV SOLN
2.0000 g | INTRAVENOUS | Status: AC
  Administered 2017-10-16: 2 g via INTRAVENOUS
  Filled 2017-10-16: qty 100

## 2017-10-16 MED ORDER — LIDOCAINE HCL (CARDIAC) 20 MG/ML IV SOLN
INTRAVENOUS | Status: DC | PRN
  Administered 2017-10-16: 100 mg via INTRAVENOUS

## 2017-10-16 MED ORDER — DEXAMETHASONE SODIUM PHOSPHATE 10 MG/ML IJ SOLN
INTRAMUSCULAR | Status: DC | PRN
  Administered 2017-10-16: 5 mg via INTRAVENOUS

## 2017-10-16 MED ORDER — PROPOFOL 10 MG/ML IV BOLUS
INTRAVENOUS | Status: AC
Start: 2017-10-16 — End: ?
  Filled 2017-10-16: qty 20

## 2017-10-16 MED ORDER — SUGAMMADEX SODIUM 200 MG/2ML IV SOLN
INTRAVENOUS | Status: DC | PRN
  Administered 2017-10-16: 200 mg via INTRAVENOUS

## 2017-10-16 MED ORDER — MEPERIDINE HCL 50 MG/ML IJ SOLN: 6.2500 mg | INTRAMUSCULAR | Status: DC | PRN

## 2017-10-16 MED ORDER — 0.9 % SODIUM CHLORIDE (POUR BTL) OPTIME
TOPICAL | Status: DC | PRN
  Administered 2017-10-16: 1000 mL

## 2017-10-16 MED ORDER — ACETAMINOPHEN 10 MG/ML IV SOLN
1000.0000 mg | Freq: Once | INTRAVENOUS | Status: DC | PRN
  Administered 2017-10-16: 1000 mg via INTRAVENOUS

## 2017-10-16 MED ORDER — FENTANYL CITRATE (PF) 250 MCG/5ML IJ SOLN
INTRAMUSCULAR | Status: AC
  Filled 2017-10-16: qty 5

## 2017-10-16 MED ORDER — BUPIVACAINE-EPINEPHRINE (PF) 0.25% -1:200000 IJ SOLN
INTRAMUSCULAR | Status: AC
  Filled 2017-10-16: qty 120

## 2017-10-16 MED ORDER — FENTANYL CITRATE (PF) 250 MCG/5ML IJ SOLN
INTRAMUSCULAR | Status: DC | PRN
  Administered 2017-10-16: 50 ug via INTRAVENOUS
  Administered 2017-10-16: 150 ug via INTRAVENOUS
  Administered 2017-10-16 (×4): 50 ug via INTRAVENOUS

## 2017-10-16 MED ORDER — HYDROMORPHONE HCL 1 MG/ML IJ SOLN
0.2500 mg | INTRAMUSCULAR | Status: DC | PRN
  Administered 2017-10-16 (×2): 0.5 mg via INTRAVENOUS
  Administered 2017-10-16: 1 mg via INTRAVENOUS

## 2017-10-16 MED ORDER — MIDAZOLAM HCL 5 MG/5ML IJ SOLN
INTRAMUSCULAR | Status: DC | PRN
  Administered 2017-10-16: 2 mg via INTRAVENOUS

## 2017-10-16 MED ORDER — PROMETHAZINE HCL 12.5 MG PO TABS: 12.5000 mg | ORAL_TABLET | Freq: Four times a day (QID) | ORAL | 0 refills | Status: DC | PRN

## 2017-10-16 MED ORDER — ETOMIDATE 2 MG/ML IV SOLN
INTRAVENOUS | Status: DC | PRN
  Administered 2017-10-16: 10 mg via INTRAVENOUS

## 2017-10-16 MED ORDER — LACTATED RINGERS IV SOLN
INTRAVENOUS | Status: DC | PRN
  Administered 2017-10-16: 07:00:00 via INTRAVENOUS

## 2017-10-16 MED ORDER — MIDAZOLAM HCL 2 MG/2ML IJ SOLN
INTRAMUSCULAR | Status: AC
  Filled 2017-10-16: qty 2

## 2017-10-16 MED ORDER — BUPIVACAINE-EPINEPHRINE 0.25% -1:200000 IJ SOLN
INTRAMUSCULAR | Status: DC | PRN
  Administered 2017-10-16: 20 mL

## 2017-10-16 MED ORDER — PROMETHAZINE HCL 25 MG/ML IJ SOLN: 6.2500 mg | INTRAMUSCULAR | Status: DC | PRN

## 2017-10-16 MED ORDER — GABAPENTIN 300 MG PO CAPS
300.0000 mg | ORAL_CAPSULE | ORAL | Status: AC
  Administered 2017-10-16: 300 mg via ORAL
  Filled 2017-10-16: qty 1

## 2017-10-16 MED ORDER — ONDANSETRON HCL 4 MG/2ML IJ SOLN
INTRAMUSCULAR | Status: DC | PRN
  Administered 2017-10-16: 4 mg via INTRAVENOUS

## 2017-10-16 MED ORDER — ACETAMINOPHEN 500 MG PO TABS
1000.0000 mg | ORAL_TABLET | ORAL | Status: AC
  Administered 2017-10-16: 1000 mg via ORAL
  Filled 2017-10-16: qty 2

## 2017-10-16 SURGICAL SUPPLY — 35 items
BENZOIN TINCTURE PRP APPL 2/3 (GAUZE/BANDAGES/DRESSINGS) ×2 IMPLANT
BLADE CLIPPER SURG (BLADE) IMPLANT
CANISTER SUCT 3000ML PPV (MISCELLANEOUS) ×2 IMPLANT
CHLORAPREP W/TINT 26ML (MISCELLANEOUS) IMPLANT
COVER SURGICAL LIGHT HANDLE (MISCELLANEOUS) ×2 IMPLANT
DRAPE LAPAROSCOPIC ABDOMINAL (DRAPES) ×2 IMPLANT
DRSG TEGADERM 4X4.75 (GAUZE/BANDAGES/DRESSINGS) ×2 IMPLANT
ELECT BLADE 4.0 EZ CLEAN MEGAD (MISCELLANEOUS) ×2
ELECT CAUTERY BLADE 6.4 (BLADE) ×2 IMPLANT
ELECT REM PT RETURN 9FT ADLT (ELECTROSURGICAL) ×2
ELECTRODE BLDE 4.0 EZ CLN MEGD (MISCELLANEOUS) ×1 IMPLANT
ELECTRODE REM PT RTRN 9FT ADLT (ELECTROSURGICAL) ×1 IMPLANT
GAUZE SPONGE 4X4 12PLY STRL (GAUZE/BANDAGES/DRESSINGS) ×2 IMPLANT
GLOVE BIO SURGEON STRL SZ7 (GLOVE) ×2 IMPLANT
GLOVE BIOGEL PI IND STRL 7.5 (GLOVE) ×1 IMPLANT
GLOVE BIOGEL PI INDICATOR 7.5 (GLOVE) ×1
GOWN STRL REUS W/ TWL LRG LVL3 (GOWN DISPOSABLE) ×2 IMPLANT
GOWN STRL REUS W/TWL LRG LVL3 (GOWN DISPOSABLE) ×2
KIT BASIN OR (CUSTOM PROCEDURE TRAY) ×2 IMPLANT
KIT ROOM TURNOVER OR (KITS) ×2 IMPLANT
MESH VENTRALEX ST 8CM LRG (Mesh General) ×2 IMPLANT
NEEDLE HYPO 25GX1X1/2 BEV (NEEDLE) ×2 IMPLANT
NS IRRIG 1000ML POUR BTL (IV SOLUTION) ×2 IMPLANT
PACK GENERAL/GYN (CUSTOM PROCEDURE TRAY) ×2 IMPLANT
PAD ARMBOARD 7.5X6 YLW CONV (MISCELLANEOUS) ×4 IMPLANT
STRIP CLOSURE SKIN 1/2X4 (GAUZE/BANDAGES/DRESSINGS) ×2 IMPLANT
SUT MNCRL AB 4-0 PS2 18 (SUTURE) ×2 IMPLANT
SUT NOVA NAB GS-21 0 18 T12 DT (SUTURE) ×4 IMPLANT
SUT NOVA NAB GS-21 1 T12 (SUTURE) ×2 IMPLANT
SUT VIC AB 3-0 SH 27 (SUTURE) ×1
SUT VIC AB 3-0 SH 27XBRD (SUTURE) ×1 IMPLANT
SYR CONTROL 10ML LL (SYRINGE) ×2 IMPLANT
TOWEL OR 17X24 6PK STRL BLUE (TOWEL DISPOSABLE) ×2 IMPLANT
TOWEL OR 17X26 10 PK STRL BLUE (TOWEL DISPOSABLE) ×2 IMPLANT
TRAY FOLEY W/METER SILVER 14FR (SET/KITS/TRAYS/PACK) IMPLANT

## 2017-10-16 NOTE — Anesthesia Postprocedure Evaluation (Signed)
Anesthesia Post Note  Patient: Dawn Mcguire  Procedure(s) Performed: OPEN VENTRAL HERNIA REPAIR WITH MESH (N/A Abdomen) INSERTION OF MESH (N/A Abdomen)     Patient location during evaluation: PACU Anesthesia Type: General Level of consciousness: awake Pain management: pain level controlled Vital Signs Assessment: post-procedure vital signs reviewed and stable Respiratory status: spontaneous breathing Cardiovascular status: stable Postop Assessment: no apparent nausea or vomiting Anesthetic complications: no    Last Vitals:  Vitals:   10/16/17 0958 10/16/17 1015  BP:  122/74  Pulse: (!) 50 (!) 50  Resp: (!) 9 (!) 8  Temp:    SpO2: 98% 99%    Last Pain:  Vitals:   10/16/17 1015  TempSrc:   PainSc: Asleep   Pain Goal:                 Ceara Wrightson JR,JOHN Avory Mimbs

## 2017-10-16 NOTE — Anesthesia Procedure Notes (Signed)
Procedure Name: Intubation Date/Time: 10/16/2017 8:00 AM Performed by: White, Amedeo Plenty, CRNA Pre-anesthesia Checklist: Patient identified, Emergency Drugs available, Suction available and Patient being monitored Patient Re-evaluated:Patient Re-evaluated prior to induction Oxygen Delivery Method: Circle System Utilized Preoxygenation: Pre-oxygenation with 100% oxygen Induction Type: IV induction Ventilation: Mask ventilation without difficulty Laryngoscope Size: Mac and 4 Grade View: Grade I Tube type: Oral Tube size: 7.0 mm Number of attempts: 1 Airway Equipment and Method: Stylet and Oral airway Placement Confirmation: ETT inserted through vocal cords under direct vision,  positive ETCO2 and breath sounds checked- equal and bilateral Secured at: 22 cm Tube secured with: Tape Dental Injury: Teeth and Oropharynx as per pre-operative assessment

## 2017-10-16 NOTE — Op Note (Signed)
Ventral Hernia Repair Procedure Note  Indications: Symptomatic ventral hernia above umbilicus from previous laparoscopic surgery.  Pre-operative Diagnosis: Ventral hernia  Post-operative Diagnosis: Ventral hernia - 5 cm defect  Surgeon: Maia Petties   Assistants: none  Anesthesia: General endotracheal anesthesia  ASA Class: 2  Procedure Details  The patient was seen in the Holding Room. The risks, benefits, complications, treatment options, and expected outcomes were discussed with the patient. The possibilities of reaction to medication, pulmonary aspiration, perforation of viscus, bleeding, recurrent infection, the need for additional procedures, failure to diagnose a condition, and creating a complication requiring transfusion or operation were discussed with the patient. The patient concurred with the proposed plan, giving informed consent.  The site of surgery properly noted/marked. The patient was taken to the operating room, identified as Dawn Mcguire and the procedure verified as ventral hernia repair. A Time Out was held and the above information confirmed.  The patient was placed supine.  After establishing general anesthesia, the abdomen was prepped with Chloraprep and draped in sterile fashion.  We made a vertical incision over the palpable hernia above the umbilicus. Dissection was carried down to the hernia sac located above the fascia and mobilized from surrounding structures.  Intact fascia was identified circumferentially around the defect.  The large hernia sac was excised.  We used an 8 cm Ventralex mesh and secured this to the fascia with interrupted 0 Novofil sutures.  The fascial defect was reapproximated with interrupted figure-of-8 1 Novofil sutures.  The subcutaneous tissues were irrigated.  Hemostasis was confirmed.  The skin incision was closed in layers with a 3-0 Vicryl subcutaneous suture and 4-0 monocryl subcuticular closure.  Steri-Strips were applied at the  end of the operation.    Instrument, sponge, and needle counts were correct prior to closure and at the conclusion of the case.   Findings: 4.5 cm defect  Estimated Blood Loss:  Minimal         Drains: none                      Complications:  None; patient tolerated the procedure well.         Disposition: PACU - hemodynamically stable.         Condition: stable  Imogene Burn. Georgette Dover, MD, [facility] Surgery  General/ Trauma Surgery  10/16/2017 9:17 AM

## 2017-10-16 NOTE — Transfer of Care (Signed)
Immediate Anesthesia Transfer of Care Note  Patient: Dawn Mcguire  Procedure(s) Performed: OPEN VENTRAL HERNIA REPAIR WITH MESH (N/A Abdomen) INSERTION OF MESH (N/A Abdomen)  Patient Location: PACU  Anesthesia Type:General  Level of Consciousness: awake, alert , oriented and patient cooperative  Airway & Oxygen Therapy: Patient Spontanous Breathing and Patient connected to nasal cannula oxygen  Post-op Assessment: Report given to RN and Post -op Vital signs reviewed and stable  Post vital signs: Reviewed and stable  Last Vitals:  Vitals Value Taken Time  BP 145/88 10/16/2017  9:21 AM  Temp    Pulse 57 10/16/2017  9:23 AM  Resp 6 10/16/2017  9:23 AM  SpO2 100 % 10/16/2017  9:23 AM  Vitals shown include unvalidated device data.  Last Pain:  Vitals:   10/16/17 0622  TempSrc: Oral  PainSc:          Complications: No apparent anesthesia complications

## 2017-10-16 NOTE — Discharge Instructions (Signed)
[facility], [place] [phone]   POST OP INSTRUCTIONS  Always review your discharge instruction sheet given to you by the facility where your surgery was performed.  IF YOU HAVE DISABILITY OR FAMILY LEAVE FORMS, YOU MUST BRING THEM TO THE OFFICE FOR PROCESSING.  PLEASE DO NOT GIVE THEM TO YOUR DOCTOR.  1. A prescription for pain medication may be given to you upon discharge.  Take your pain medication as prescribed, if needed.  If narcotic pain medicine is not needed, then you may take acetaminophen (Tylenol) or ibuprofen (Advil) as needed. 2. Take your usually prescribed medications unless otherwise directed. 3. If you need a refill on your pain medication, please contact your pharmacy. They will contact our office to request authorization.  Prescriptions will not be filled after 5pm or on week-ends. 4. You should follow a light diet the first few days after arrival home, such as soup and crackers, pudding, etc.unless your doctor has advised otherwise. A high-fiber, low fat diet can be resumed as tolerated.   Be sure to include lots of fluids daily. Most patients will experience some swelling and bruising on the chest and neck area.  Ice packs will help.  Swelling and bruising can take several days to resolve 5. Most patients will experience some swelling and bruising in the area of the incision. Ice pack will help. Swelling and bruising can take several days to resolve..  6. It is common to experience some constipation if taking pain medication after surgery.  Increasing fluid intake and taking a stool softener will usually help or prevent this problem from occurring.  A mild laxative (Milk of Magnesia or Miralax) should be taken according to package directions if there are no bowel movements after 48 hours. 7.  Remove the outer dressing in 48 hours.  You may have steri-strips (small skin tapes) in place directly over the incision.  These strips should be left on the skin for  7-10 days.  You may get these wet in the shower 8. ACTIVITIES:  You may resume regular (light) daily activities beginning the next day--such as daily self-care, walking, climbing stairs--gradually increasing activities as tolerated.  You may have sexual intercourse when it is comfortable.  Refrain from any heavy lifting or straining until approved by your doctor. a. You may drive when you no longer are taking prescription pain medication, you can comfortably wear a seatbelt, and you can safely maneuver your car and apply brakes b. Return to Work: ___________________________________ 48. You should see your doctor in the office for a follow-up appointment approximately two weeks after your surgery.  Make sure that you call for this appointment within a day or two after you arrive home to insure a convenient appointment time. OTHER INSTRUCTIONS:  _____________________________________________________________ _____________________________________________________________  WHEN TO CALL YOUR DOCTOR: 1. Fever over 101.0 2. Inability to urinate 3. Nausea and/or vomiting 4. Extreme swelling or bruising 5. Continued bleeding from incision. 6. Increased pain, redness, or drainage from the incision. 7. Difficulty swallowing or breathing 8. Muscle cramping or spasms. 9. Numbness or tingling in hands or feet or around lips.  The clinic staff is available to answer your questions during regular business hours.  Please dont hesitate to call and ask to speak to one of the nurses if you have concerns.  For further questions, please visit [email or web address]

## 2017-10-16 NOTE — H&P (Signed)
History of Present Illness  The patient is a 52 year old female who presents with an incisional hernia.  Referred by Dr. Abelino Derrick for ventral incisional hernia  This is a 52 year old female who is status post laparoscopic cholecystectomy on 03/26/17. The surgery was performed at [facility] by Dr. Margurite Auerbach. Upon reviewing his operative note, there did not seem to be any unusual findings at the time of surgery. Pathology confirmed chronic calculus cholecystitis with a single gallstone measuring 2.7 cm. The patient was kept overnight and was discharged on postoperative day #1. Apparently, the patient had significant postoperative pain management issues. She was readmitted to the hospital on 03/31/17 which was postoperative day #5 with low-grade fever and leukocytosis. CT scan showed a fluid collection in the gallbladder fossa. This did not seem to be definitive for a bile leak but the patient was admitted to the hospital and placed on IV antibiotics. Her white count decreased. Her abdominal pain improved. She was discharged home on 04/02/17. Her abdominal pain has since resolved. However since surgery, she has developed a palpable bulge in her abdomen above her umbilicus. This has become larger and is causing some discomfort. The patient does not want to go back to see her original surgeon and she felt that they did not address her complaints when she pointed out the palpable bulge above her umbilicus. She had superficial wound infections with several sutures that had to be removed from her laparoscopic incisions. These have all healed. She denies any obstructive complaints. She has not had any imaging of this area. She did have a reaction that she believes was an allergic reaction to propofol.  The patient states that she does not want to have any more of her care in the [facility] regional system.  We obtained a CT scan that showed only a single periumbilical ventral  hernia. CLINICAL DATA: Pain and tenderness, incisional hernia  EXAM: CT ABDOMEN AND PELVIS WITH CONTRAST  TECHNIQUE: Multidetector CT imaging of the abdomen and pelvis was performed using the standard protocol following bolus administration of intravenous contrast.  CONTRAST: 183mL ISOVUE-300 IOPAMIDOL (ISOVUE-300) INJECTION 61%  COMPARISON: None.  FINDINGS: Lower chest: The lung bases are clear. No effusion is seen.  Hepatobiliary: The liver enhances with no focal abnormality and no ductal dilatation is seen. Surgical clips are present from prior cholecystectomy.  Pancreas: Pancreas is normal in size and the pancreatic duct is not dilated.  Spleen: The spleen is unremarkable.  Adrenals/Urinary Tract: The adrenal glands appear normal. The kidneys enhance with no calculus or mass and on delayed images, the pelvocaliceal systems appear normal. The ureters are normal in caliber. The urinary bladder is not well distended but no abnormality is seen.  Stomach/Bowel: The stomach is moderately distended with oral contrast. No abnormality is noted. No small bowel distention is seen. Multiple rectosigmoid colon diverticula are noted. No diverticulitis is seen. Diverticula also are scattered throughout the more proximal colon. The terminal ileum and the appendix are unremarkable.  Vascular/Lymphatic: The abdominal aorta is normal in caliber. Only small retroperitoneal lymph nodes are present.  Reproductive: The uterus has previously been resected. No adnexal lesion is seen. No fluid is noted within the pelvis.  Other: There is a midline ventral hernia just above the umbilicus containing fat and a portion of the mid transverse colon without obstruction.  Musculoskeletal: The lumbar vertebrae are normal alignment with normal intervertebral disc spaces. Mild degenerative changes noted at L4-5. The SI joints are well corticated.  IMPRESSION: 1. Midline  ventral hernia just above the umbilicus containing fat and a nondistended portion of the mid transverse colon. 2. Colonic diverticula primarily in the rectosigmoid colon.   Electronically Signed By: Ivar Drape M.D. On: 08/26/2017 13:48    Past Surgical History  Gallbladder Surgery - Laparoscopic  Gallbladder Surgery - Open  Hemorrhoidectomy  Hysterectomy (not due to cancer) - Partial  Mammoplasty; Reduction  Bilateral.  Diagnostic Studies History  Colonoscopy  within last year Mammogram  within last year Pap Smear  1-5 years ago  Allergies  Sulfa Drugs  Penicillins  OxyCODONE HCl (Abuse Deter) *ANALGESICS - OPIOID*  Propofol *GENERAL ANESTHETICS*  Macrobid *URINARY ANTI-INFECTIVES*  Cortisone *CORTICOSTEROIDS*  Allergies Reconciled   Medication History  TraMADol HCl (50MG  Tablet, Oral) Active. Cefdinir (300MG  Capsule, Oral) Active. MetroNIDAZOLE (500MG  Tablet, Oral) Active. Biotin (1MG  Capsule, Oral) Active. Vitamin B-12 (1000MCG Tablet, Oral) Active. Multi-Vitamin (Oral) Active. Naproxen (250MG  Tablet, Oral) Active. Medications Reconciled  Social History  Alcohol use  Occasional alcohol use. Caffeine use  Carbonated beverages, Tea. Illicit drug use  Remotely quit drug use. Tobacco use  Never smoker.  Family History  Arthritis  Mother. Cerebrovascular Accident  Father. Colon Cancer  Father. Colon Polyps  Brother. Diabetes Mellitus  Son. Rectal Cancer  Father.  Pregnancy / Birth History  Age at menarche  29 years. Age of menopause  36-50 Gravida  1 Irregular periods  Length (months) of breastfeeding  7-12 Maternal age  74-30 Para  1  Other Problems Arthritis  Back Pain  Bladder Problems  Cholelithiasis  Gastroesophageal Reflux Disease  Kidney Stone  Umbilical Hernia Repair     Review of Systems  General Present- Appetite Loss and Weight Gain. Not Present- Chills, Fatigue,  Fever, Night Sweats and Weight Loss. Skin Present- Dryness and Rash. Not Present- Change in Wart/Mole, Hives, Jaundice, New Lesions, Non-Healing Wounds and Ulcer. HEENT Present- Seasonal Allergies and Wears glasses/contact lenses. Not Present- Earache, Hearing Loss, Hoarseness, Nose Bleed, Oral Ulcers, Ringing in the Ears, Sinus Pain, Sore Throat, Visual Disturbances and Yellow Eyes. Breast Not Present- Breast Mass, Breast Pain, Nipple Discharge and Skin Changes. Cardiovascular Not Present- Chest Pain, Difficulty Breathing Lying Down, Leg Cramps, Palpitations, Rapid Heart Rate, Shortness of Breath and Swelling of Extremities. Gastrointestinal Present- Abdominal Pain, Bloating, Change in Bowel Habits, Gets full quickly at meals and Hemorrhoids. Not Present- Bloody Stool, Chronic diarrhea, Constipation, Difficulty Swallowing, Excessive gas, Indigestion, Nausea, Rectal Pain and Vomiting. Female Genitourinary Present- Frequency and Urgency. Not Present- Nocturia, Painful Urination and Pelvic Pain. Musculoskeletal Present- Back Pain, Joint Pain and Swelling of Extremities. Not Present- Joint Stiffness, Muscle Pain and Muscle Weakness. Neurological Not Present- Decreased Memory, Fainting, Headaches, Numbness, Seizures, Tingling, Tremor, Trouble walking and Weakness. Psychiatric Not Present- Anxiety, Bipolar, Change in Sleep Pattern, Depression, Fearful and Frequent crying. Endocrine Present- Hair Changes. Not Present- Cold Intolerance, Excessive Hunger, Heat Intolerance, Hot flashes and New Diabetes. Hematology Not Present- Blood Thinners, Easy Bruising, Excessive bleeding, Gland problems, HIV and Persistent Infections.  Vitals  Weight: 259.4 lb Height: 68in Body Surface Area: 2.28 m Body Mass Index: 39.44 kg/m  Temp.: 98.75F  Pulse: 96 (Regular)  BP: 140/78 (Sitting, Left Arm, Standard)       Physical Exam  The physical exam findings are as follows: Note:WDWN in NAD Eyes:  Pupils equal, round; sclera anicteric HENT: Oral mucosa moist; good dentition Neck: No masses palpated, no thyromegaly Lungs: CTA bilaterally; normal respiratory effort CV: Regular rate and rhythm; no murmurs; extremities well-perfused with  no edema Abd: +bowel sounds, soft, non-tender, no palpable organomegaly; well-healed laparoscopic incisions with no signs of infection Above the umbilical incision, there is a five cm palpable, visible mass; mildly tender; reducible when supine Skin: Warm, dry; no sign of jaundice Psychiatric - alert and oriented x 4; calm mood and affect    Assessment & Plan  VENTRAL INCISIONAL HERNIA WITHOUT OBSTRUCTION OR GANGRENE (K43.2) Current Plans  Open ventral hernia repair with mesh.  The surgical procedure has been discussed with the patient.  Potential risks, benefits, alternative treatments, and expected outcomes have been explained.  All of the patient's questions at this time have been answered.  The likelihood of reaching the patient's treatment goal is good.  The patient understand the proposed surgical procedure and wishes to proceed.   Imogene Burn. Georgette Dover, MD, [facility]  General/ Trauma Surgery  10/16/2017 7:13 AM

## 2017-10-17 ENCOUNTER — Encounter (HOSPITAL_COMMUNITY): Payer: Self-pay | Admitting: Surgery

## 2017-11-23 ENCOUNTER — Telehealth: Payer: Self-pay | Admitting: Surgery

## 2017-11-23 NOTE — Telephone Encounter (Signed)
Dawn Mcguire had a ventral hernia repair by Dr. Georgette Dover on 10/16/2017.  She was doing well until she developed increasing redness around her incision.  She is not running a fever.  The wound had been doing well until the last few days.  I told her to put warm soaks on the wound and check with our office tomorrow AM.  Alphonsa Overall, MD, [facility] Pager: [phone] Office phone:  [phone]

## 2017-11-24 DIAGNOSIS — L409 Psoriasis, unspecified: Secondary | ICD-10-CM | POA: Diagnosis not present

## 2017-11-24 DIAGNOSIS — L659 Nonscarring hair loss, unspecified: Secondary | ICD-10-CM | POA: Diagnosis not present

## 2017-11-25 ENCOUNTER — Other Ambulatory Visit: Payer: Self-pay | Admitting: Student

## 2017-11-25 ENCOUNTER — Ambulatory Visit
Admission: RE | Admit: 2017-11-25 | Discharge: 2017-11-25 | Disposition: A | Discharge status: Home / Self Care | Payer: BLUE CROSS/BLUE SHIELD | Source: Ambulatory Visit | Attending: Student | Admitting: Student

## 2017-11-25 DIAGNOSIS — K432 Incisional hernia without obstruction or gangrene: Secondary | ICD-10-CM

## 2017-11-25 DIAGNOSIS — N200 Calculus of kidney: Secondary | ICD-10-CM | POA: Diagnosis not present

## 2017-11-25 MED ORDER — IOPAMIDOL (ISOVUE-300) INJECTION 61%
100.0000 mL | Freq: Once | INTRAVENOUS | Status: AC | PRN
  Administered 2017-11-25: 100 mL via INTRAVENOUS

## 2017-11-26 ENCOUNTER — Other Ambulatory Visit (HOSPITAL_COMMUNITY): Payer: Self-pay | Admitting: Surgery

## 2017-11-26 DIAGNOSIS — K432 Incisional hernia without obstruction or gangrene: Secondary | ICD-10-CM

## 2017-11-27 ENCOUNTER — Other Ambulatory Visit: Payer: Self-pay | Admitting: Radiology

## 2017-11-28 ENCOUNTER — Encounter (HOSPITAL_COMMUNITY): Payer: Self-pay

## 2017-11-28 ENCOUNTER — Ambulatory Visit (HOSPITAL_COMMUNITY)
Admission: RE | Admit: 2017-11-28 | Discharge: 2017-11-28 | Disposition: A | Discharge status: Home / Self Care | Payer: BLUE CROSS/BLUE SHIELD | Source: Ambulatory Visit | Attending: Surgery | Admitting: Surgery

## 2017-11-28 ENCOUNTER — Other Ambulatory Visit (HOSPITAL_COMMUNITY): Payer: Self-pay | Admitting: Surgery

## 2017-11-28 DIAGNOSIS — Z881 Allergy status to other antibiotic agents status: Secondary | ICD-10-CM | POA: Diagnosis not present

## 2017-11-28 DIAGNOSIS — Y836 Removal of other organ (partial) (total) as the cause of abnormal reaction of the patient, or of later complication, without mention of misadventure at the time of the procedure: Secondary | ICD-10-CM | POA: Insufficient documentation

## 2017-11-28 DIAGNOSIS — Z8261 Family history of arthritis: Secondary | ICD-10-CM | POA: Insufficient documentation

## 2017-11-28 DIAGNOSIS — Z9049 Acquired absence of other specified parts of digestive tract: Secondary | ICD-10-CM | POA: Diagnosis not present

## 2017-11-28 DIAGNOSIS — Z882 Allergy status to sulfonamides status: Secondary | ICD-10-CM | POA: Insufficient documentation

## 2017-11-28 DIAGNOSIS — Z79899 Other long term (current) drug therapy: Secondary | ICD-10-CM | POA: Diagnosis not present

## 2017-11-28 DIAGNOSIS — Z833 Family history of diabetes mellitus: Secondary | ICD-10-CM | POA: Diagnosis not present

## 2017-11-28 DIAGNOSIS — R110 Nausea: Secondary | ICD-10-CM | POA: Insufficient documentation

## 2017-11-28 DIAGNOSIS — T8141XA Infection following a procedure, superficial incisional surgical site, initial encounter: Secondary | ICD-10-CM | POA: Diagnosis not present

## 2017-11-28 DIAGNOSIS — Z885 Allergy status to narcotic agent status: Secondary | ICD-10-CM | POA: Insufficient documentation

## 2017-11-28 DIAGNOSIS — Z888 Allergy status to other drugs, medicaments and biological substances status: Secondary | ICD-10-CM | POA: Diagnosis not present

## 2017-11-28 DIAGNOSIS — Z90710 Acquired absence of both cervix and uterus: Secondary | ICD-10-CM | POA: Diagnosis not present

## 2017-11-28 DIAGNOSIS — Z880 Allergy status to penicillin: Secondary | ICD-10-CM | POA: Diagnosis not present

## 2017-11-28 DIAGNOSIS — Z8249 Family history of ischemic heart disease and other diseases of the circulatory system: Secondary | ICD-10-CM | POA: Insufficient documentation

## 2017-11-28 DIAGNOSIS — K219 Gastro-esophageal reflux disease without esophagitis: Secondary | ICD-10-CM | POA: Insufficient documentation

## 2017-11-28 DIAGNOSIS — L7682 Other postprocedural complications of skin and subcutaneous tissue: Secondary | ICD-10-CM | POA: Diagnosis not present

## 2017-11-28 DIAGNOSIS — L409 Psoriasis, unspecified: Secondary | ICD-10-CM | POA: Insufficient documentation

## 2017-11-28 DIAGNOSIS — K432 Incisional hernia without obstruction or gangrene: Secondary | ICD-10-CM

## 2017-11-28 LAB — BASIC METABOLIC PANEL
Anion gap: 11 (ref 5–15)
BUN: 14 mg/dL (ref 6–20)
CHLORIDE: 106 mmol/L (ref 101–111)
CO2: 23 mmol/L (ref 22–32)
CREATININE: 0.72 mg/dL (ref 0.44–1.00)
Calcium: 9.2 mg/dL (ref 8.9–10.3)
GFR calc non Af Amer: >60 mL/min (ref >60)
Glucose, Bld: 90 mg/dL (ref 65–99)
POTASSIUM: 3.8 mmol/L (ref 3.5–5.1)
Sodium: 140 mmol/L (ref 135–145)

## 2017-11-28 LAB — CBC WITH DIFFERENTIAL/PLATELET
Basophils Absolute: 0 K/uL (ref 0.0–0.1)
Basophils Relative: 0 %
Eosinophils Absolute: 0.1 K/uL (ref 0.0–0.7)
Eosinophils Relative: 1 %
HCT: 44.3 % (ref 36.0–46.0)
Hemoglobin: 14.6 g/dL (ref 12.0–15.0)
Lymphocytes Relative: 27 %
Lymphs Abs: 2.4 K/uL (ref 0.7–4.0)
MCH: 30 pg (ref 26.0–34.0)
MCHC: 33 g/dL (ref 30.0–36.0)
MCV: 91.2 fL (ref 78.0–100.0)
Monocytes Absolute: 0.5 K/uL (ref 0.1–1.0)
Monocytes Relative: 6 %
Neutro Abs: 5.8 K/uL (ref 1.7–7.7)
Neutrophils Relative %: 66 %
Platelets: 315 K/uL (ref 150–400)
RBC: 4.86 MIL/uL (ref 3.87–5.11)
RDW: 12.8 % (ref 11.5–15.5)
WBC: 8.8 K/uL (ref 4.0–10.5)

## 2017-11-28 LAB — PROTIME-INR
INR: 0.88
Prothrombin Time: 11.8 s (ref 11.4–15.2)

## 2017-11-28 MED ORDER — LIDOCAINE-EPINEPHRINE (PF) 2 %-1:200000 IJ SOLN
INTRAMUSCULAR | Status: AC
  Filled 2017-11-28: qty 20

## 2017-11-28 MED ORDER — MIDAZOLAM HCL 2 MG/2ML IJ SOLN
INTRAMUSCULAR | Status: AC | PRN
  Administered 2017-11-28: 0.5 mg via INTRAVENOUS
  Administered 2017-11-28: 1 mg via INTRAVENOUS
  Administered 2017-11-28: 0.5 mg via INTRAVENOUS

## 2017-11-28 MED ORDER — SODIUM CHLORIDE 0.9 % IV SOLN
INTRAVENOUS | Status: DC
  Administered 2017-11-28: 11:00:00 via INTRAVENOUS

## 2017-11-28 MED ORDER — FENTANYL CITRATE (PF) 100 MCG/2ML IJ SOLN
INTRAMUSCULAR | Status: AC | PRN
  Administered 2017-11-28: 25 ug via INTRAVENOUS
  Administered 2017-11-28: 50 ug via INTRAVENOUS
  Administered 2017-11-28: 25 ug via INTRAVENOUS

## 2017-11-28 MED ORDER — MIDAZOLAM HCL 2 MG/2ML IJ SOLN
INTRAMUSCULAR | Status: AC
  Filled 2017-11-28: qty 4

## 2017-11-28 MED ORDER — FENTANYL CITRATE (PF) 100 MCG/2ML IJ SOLN
INTRAMUSCULAR | Status: AC
  Filled 2017-11-28: qty 4

## 2017-11-28 NOTE — Consult Note (Signed)
Chief Complaint: Patient was seen in consultation today for image guided aspiration/possible drainage of abdominal wall collection  Referring Physician(s): Tsuei,Matthew  Supervising Physician: Sandi Mariscal  Patient Status: [facility] - Out-pt  History of Present Illness: Dawn Mcguire is a 52 y.o. female with history of laparoscopic cholecystectomy in 2018 as well as open ventral hernia repair with mesh on 10/16/2017.  She presents now with several day history of mid abdominal fullness/pressure as well as nausea.  Recent CT scan reveals subcutaneous postop fluid collection at site of prior hernia repair with thin marginal enhancement.  Request now received for image guided aspiration and possible drainage of the fluid collection.  Past Medical History:  Diagnosis Date  . Arthritis   . Carpal tunnel syndrome of left wrist   . GERD (gastroesophageal reflux disease)   . Psoriasis     Past Surgical History:  Procedure Laterality Date  . ABDOMINAL HYSTERECTOMY    . BLADDER REPAIR    . BREAST SURGERY    . CARPAL TUNNEL RELEASE     on the right wrist  . CHOLECYSTECTOMY    . INSERTION OF MESH N/A 10/16/2017   Procedure: INSERTION OF MESH;  Surgeon: Donnie Mesa, MD;  Location: [facility];  Service: General;  Laterality: N/A;  . VENTRAL HERNIA REPAIR N/A 10/16/2017   Procedure: OPEN VENTRAL HERNIA REPAIR WITH MESH;  Surgeon: Donnie Mesa, MD;  Location: [facility];  Service: General;  Laterality: N/A;    Allergies: Latex; Sulfa antibiotics; Sulfasalazine; Corticosteroids; Hydrocodone; Nitrofurantoin monohyd macro; Penicillins; Prednisone; Propofol; and Tramadol  Medications: Prior to Admission medications   Medication Sig Start Date End Date Taking? Authorizing Provider  naproxen sodium (ALEVE) 220 MG tablet Take 440 mg by mouth 2 (two) times daily.    Yes [provider]  omeprazole (PRILOSEC) 20 MG capsule Take 20 mg by mouth daily.   Yes [provider]  OVER THE  COUNTER MEDICATION Take 1 tablet by mouth daily. Viviscal Hair Growth Tablets (VITAMIN C/NIACIN/BIOTIN/CALCIUM/IRON/ZINC/AMINOMAR MARINE COMPLEX/SHARK CARTILAGE/OYSTER EXTRACT/HORSETAIL EXTRACT/MILLET SEED EXTRACT)   Yes [provider]  oxyCODONE (OXY IR/ROXICODONE) 5 MG immediate release tablet Take 1 tablet (5 mg total) by mouth every 6 (six) hours as needed for severe pain. 10/16/17  Yes Donnie Mesa, MD  Ascorbic Acid (VITAMIN C) 1000 MG tablet Take 1,000 mg by mouth daily.    [provider]  Cholecalciferol (VITAMIN D) 2000 units CAPS Take 2,000 Units by mouth daily.    [provider]  Cyanocobalamin (VITAMIN B-12) 2500 MCG SUBL Take 2,500 mcg by mouth daily.    [provider]  loratadine (CLARITIN) 10 MG tablet Take 10 mg by mouth daily as needed for allergies.    [provider]  promethazine (PHENERGAN) 12.5 MG tablet Take 1 tablet (12.5 mg total) by mouth every 6 (six) hours as needed for nausea or vomiting. 10/16/17   Donnie Mesa, MD     Family History  Problem Relation Age of Onset  . Arthritis Mother   . Heart attack Mother   . High blood pressure Mother   . Cancer Father   . Diabetes Father   . Arthritis Brother   . Arthritis Brother   . Arthritis Brother   . Diabetes Son     Social History   Socioeconomic History  . Marital status: Married    Spouse name: Not on file  . Number of children: Not on file  . Years of education: Not on file  . Highest education  level: Not on file  Occupational History  . Not on file  Social Needs  . Financial resource strain: Not on file  . Food insecurity:    Worry: Not on file    Inability: Not on file  . Transportation needs:    Medical: Not on file    Non-medical: Not on file  Tobacco Use  . Smoking status: Never Smoker  . Smokeless tobacco: Never Used  Substance and Sexual Activity  . Alcohol use: No  . Drug use: No  . Sexual activity: Not on file  Lifestyle  . Physical  activity:    Days per week: Not on file    Minutes per session: Not on file  . Stress: Not on file  Relationships  . Social connections:    Talks on phone: Not on file    Gets together: Not on file    Attends religious service: Not on file    Active member of club or organization: Not on file    Attends meetings of clubs or organizations: Not on file    Relationship status: Not on file  Other Topics Concern  . Not on file  Social History Narrative  . Not on file      Review of Systems see above; denies fever, headache, chest pain, dyspnea, cough, back pain, vomiting or abnormal bleeding.  Vital Signs: BP (!) 138/92 (BP Location: Right Arm)   Pulse 82   Temp 97.6 F (36.4 C) (Oral)   Resp 16   SpO2 100%   Physical Exam awake, alert.  Chest clear to auscultation bilaterally.  Heart with regular rate and rhythm.  Abdomen obese, soft, positive bowel sounds, periumbilical wound site with some mild erythema and tenderness to palpation.  No significant lower extremity edema. Imaging: Ct Abdomen Pelvis W Contrast  Result Date: 11/26/2017 CLINICAL DATA:  Swelling and fluid for 1 week. Prior ventral hernia repair, postoperative for 5 weeks. EXAM: CT ABDOMEN AND PELVIS WITH CONTRAST TECHNIQUE: Multidetector CT imaging of the abdomen and pelvis was performed using the standard protocol following bolus administration of intravenous contrast. CONTRAST:  174mL ISOVUE-300 IOPAMIDOL (ISOVUE-300) INJECTION 61% COMPARISON:  08/26/2017 FINDINGS: Lower chest: Unremarkable Hepatobiliary: Stable 7 mm hypodense lesion in the lateral segment left hepatic lobe on image 27/2. Cholecystectomy. Pancreas: Unremarkable Spleen: Unremarkable Adrenals/Urinary Tract: Adrenal glands normal. 1-2 mm left kidney lower pole nonobstructive renal calculus on image 41/2. Otherwise unremarkable. Stomach/Bowel: 1.9 cm periampullary duodenal diverticulum, without inflammatory findings. Sigmoid diverticulosis without active  diverticulitis. Vascular/Lymphatic: Unremarkable Reproductive: Stable 8 mm hyperdense lesion along the left side of the vaginal cuff, possibly a proteinaceous cyst. Adnexa unremarkable. Other: No supplemental non-categorized findings. Musculoskeletal: At the site of the prior hernia there is a 6.7 by 2.5 by 5.5 cm (volume = 48 cm^3) fluid collection with internal density 12 Hounsfield units, and faint and relatively thin enhancement along its margins. Underlying hernia mesh noted along the underlying omentum, without appreciable herniation around the mesh. Small bilateral groin hernias are probably direct inguinal hernias but are not entirely specific for direct versus indirect etiology. Degenerative disc disease at L3-4, L4-5, and L5-S1 with loss of disc height and potentially mild levels of foraminal impingement at L3-4 and L4-5. IMPRESSION: 1. Subcutaneous postoperative fluid collection at the site of the prior hernia repair, measuring about 48 cubic cm in volume, with thin marginal enhancement. This may simply represent a postoperative seroma or resolving postoperative hematoma. No internal gas or thick margination to further suggest abscess. The hernia  mesh is visualized in the expected location without obvious herniation of abdominal contents in the postoperative region. 2. Small bilateral groin hernias containing adipose tissue. 3. Nonobstructive 1-2 mm left kidney lower pole calculus. 4. There is potentially mild foraminal impingement at L3-4 and L4-5. 5. Sigmoid colon diverticulosis. Electronically Signed   By: Van Clines M.D.   On: 11/26/2017 09:10    Labs:  CBC: Recent Labs    10/06/17 0835 11/28/17 1121  WBC 8.8 8.8  HGB 14.5 14.6  HCT 45.0 44.3  PLT 248 315    COAGS: No results for input(s): INR, APTT in the last 8760 hours.  BMP: Recent Labs    10/06/17 0835  NA 140  K 3.9  CL 107  CO2 23  GLUCOSE 93  BUN 9  CALCIUM 8.8*  CREATININE 0.72  GFRNONAA >60  GFRAA >60      LIVER FUNCTION TESTS: No results for input(s): BILITOT, AST, ALT, ALKPHOS, PROT, ALBUMIN in the last 8760 hours.  TUMOR MARKERS: No results for input(s): AFPTM, CEA, CA199, CHROMGRNA in the last 8760 hours.  Assessment and Plan: 52 y.o. female with history of laparoscopic cholecystectomy in 2018 as well as open ventral hernia repair with mesh on 10/16/2017.  She presents now with several day history of mid abdominal fullness/pressure as well as nausea.  Recent CT scan reveals subcutaneous postop fluid collection at site of prior hernia repair with thin marginal enhancement.  Request now received for image guided aspiration and possible drainage of the fluid collection.  Details/risks of procedure, including but not limited to, internal bleeding, infection, injury to adjacent structures, discussed with patient with her understanding and consent.   Thank you for this interesting consult.  I greatly enjoyed meeting Stefannie Defeo and look forward to participating in their care.  A copy of this report was sent to the requesting provider on this date.  Electronically Signed: D. Rowe Robert, PA-C 11/28/2017, 11:38 AM   I spent a total of 20 minutes    in face to face in clinical consultation, greater than 50% of which was counseling/coordinating care for image guided aspiration and possible drainage of abdominal wall fluid collection

## 2017-11-28 NOTE — Procedures (Signed)
Pre Procedure Dx: Post-op abdominal wall fluid collection Post Procedural Dx: Same  Technically successful US guided aspiration of approximately 35 cc of serous, non-foul smelling fluid. Fluid sent to lab for analysis.   EBL: None  No immediate complications.   Ronny Bacon, MD Pager #: [phone]

## 2017-11-28 NOTE — Discharge Instructions (Signed)
Moderate Conscious Sedation, Adult, Care After These instructions provide you with information about caring for yourself after your procedure. Your health care provider may also give you more specific instructions. Your treatment has been planned according to current medical practices, but problems sometimes occur. Call your health care provider if you have any problems or questions after your procedure. What can I expect after the procedure? After your procedure, it is common:  To feel sleepy for several hours.  To feel clumsy and have poor balance for several hours.  To have poor judgment for several hours.  To vomit if you eat too soon.  Follow these instructions at home: For at least 24 hours after the procedure:   Do not: ? Participate in activities where you could fall or become injured. ? Drive. ? Use heavy machinery. ? Drink alcohol. ? Take sleeping pills or medicines that cause drowsiness. ? Make important decisions or sign legal documents. ? Take care of children on your own.  Rest. Eating and drinking  Follow the diet recommended by your health care provider.  If you vomit: ? Drink water, juice, or soup when you can drink without vomiting. ? Make sure you have little or no nausea before eating solid foods. General instructions  Have a responsible adult stay with you until you are awake and alert.  Take over-the-counter and prescription medicines only as told by your health care provider.  If you smoke, do not smoke without supervision.  Keep all follow-up visits as told by your health care provider. This is important. Contact a health care provider if:  You keep feeling nauseous or you keep vomiting.  You feel light-headed.  You develop a rash.  You have a fever. Get help right away if:  You have trouble breathing. This information is not intended to replace advice given to you by your health care provider. Make sure you discuss any questions you have  with your health care provider. Document Released: 05/05/2013 Document Revised: 12/18/2015 Document Reviewed: 11/04/2015 Elsevier Interactive Patient Education  2018 [employer].   Needle Biopsy, Care After These instructions give you information about caring for yourself after your procedure. Your doctor may also give you more specific instructions. Call your doctor if you have any problems or questions after your procedure. Follow these instructions at home:  Rest as told by your doctor.  Take medicines only as told by your doctor.  There are many different ways to close and cover the biopsy site, including stitches (sutures), skin glue, and adhesive strips. Follow instructions from your doctor about: ? How to take care of your biopsy site.  You may shower tomorrow. ? When and how you should change your bandage (dressing). ? When you should remove your dressing.  You may remove your dressing tomorrow. ? Removing whatever was used to close your biopsy site.  Check your biopsy site every day for signs of infection. Watch for: ? Redness, swelling, or pain. ? Fluid, blood, or pus. Contact a doctor if:  You have a fever.  You have redness, swelling, or pain at the biopsy site, and it lasts longer than a few days.  You have fluid, blood, or pus coming from the biopsy site.  You feel sick to your stomach (nauseous).  You throw up (vomit). Get help right away if:  You are short of breath.  You have trouble breathing.  Your chest hurts.  You feel dizzy or you pass out (faint).  You have bleeding that does  not stop with pressure or a bandage.  You cough up blood.  Your belly (abdomen) hurts. This information is not intended to replace advice given to you by your health care provider. Make sure you discuss any questions you have with your health care provider. Document Released: 06/27/2008 Document Revised: 12/21/2015 Document Reviewed: 07/11/2014 Elsevier Interactive  Patient Education  [employer].

## 2017-12-03 LAB — AEROBIC/ANAEROBIC CULTURE (SURGICAL/DEEP WOUND): CULTURE: NO GROWTH

## 2017-12-03 LAB — AEROBIC/ANAEROBIC CULTURE W GRAM STAIN (SURGICAL/DEEP WOUND): Special Requests: NORMAL

## 2017-12-26 ENCOUNTER — Other Ambulatory Visit (HOSPITAL_COMMUNITY): Payer: Self-pay | Admitting: Student

## 2017-12-26 DIAGNOSIS — K432 Incisional hernia without obstruction or gangrene: Secondary | ICD-10-CM

## 2017-12-29 ENCOUNTER — Other Ambulatory Visit: Payer: Self-pay | Admitting: Student

## 2017-12-29 ENCOUNTER — Other Ambulatory Visit (HOSPITAL_COMMUNITY): Payer: Self-pay | Admitting: Student

## 2017-12-29 DIAGNOSIS — K432 Incisional hernia without obstruction or gangrene: Secondary | ICD-10-CM

## 2017-12-30 ENCOUNTER — Other Ambulatory Visit: Payer: Self-pay | Admitting: Student

## 2017-12-31 ENCOUNTER — Encounter (HOSPITAL_COMMUNITY): Payer: Self-pay

## 2017-12-31 ENCOUNTER — Other Ambulatory Visit (HOSPITAL_COMMUNITY): Payer: Self-pay | Admitting: Student

## 2017-12-31 ENCOUNTER — Ambulatory Visit (HOSPITAL_COMMUNITY)
Admission: RE | Admit: 2017-12-31 | Discharge: 2017-12-31 | Disposition: A | Discharge status: Home / Self Care | Payer: BLUE CROSS/BLUE SHIELD | Source: Ambulatory Visit | Attending: Student | Admitting: Student

## 2017-12-31 DIAGNOSIS — L409 Psoriasis, unspecified: Secondary | ICD-10-CM | POA: Insufficient documentation

## 2017-12-31 DIAGNOSIS — X58XXXS Exposure to other specified factors, sequela: Secondary | ICD-10-CM | POA: Diagnosis not present

## 2017-12-31 DIAGNOSIS — K432 Incisional hernia without obstruction or gangrene: Secondary | ICD-10-CM

## 2017-12-31 DIAGNOSIS — Z8261 Family history of arthritis: Secondary | ICD-10-CM | POA: Insufficient documentation

## 2017-12-31 DIAGNOSIS — Z888 Allergy status to other drugs, medicaments and biological substances status: Secondary | ICD-10-CM | POA: Insufficient documentation

## 2017-12-31 DIAGNOSIS — Z79899 Other long term (current) drug therapy: Secondary | ICD-10-CM | POA: Diagnosis not present

## 2017-12-31 DIAGNOSIS — Z880 Allergy status to penicillin: Secondary | ICD-10-CM | POA: Insufficient documentation

## 2017-12-31 DIAGNOSIS — Z9049 Acquired absence of other specified parts of digestive tract: Secondary | ICD-10-CM | POA: Insufficient documentation

## 2017-12-31 DIAGNOSIS — Z882 Allergy status to sulfonamides status: Secondary | ICD-10-CM | POA: Insufficient documentation

## 2017-12-31 DIAGNOSIS — G5602 Carpal tunnel syndrome, left upper limb: Secondary | ICD-10-CM | POA: Diagnosis not present

## 2017-12-31 DIAGNOSIS — L7634 Postprocedural seroma of skin and subcutaneous tissue following other procedure: Secondary | ICD-10-CM | POA: Diagnosis not present

## 2017-12-31 DIAGNOSIS — S301XXS Contusion of abdominal wall, sequela: Secondary | ICD-10-CM | POA: Diagnosis not present

## 2017-12-31 DIAGNOSIS — Z885 Allergy status to narcotic agent status: Secondary | ICD-10-CM | POA: Insufficient documentation

## 2017-12-31 DIAGNOSIS — K219 Gastro-esophageal reflux disease without esophagitis: Secondary | ICD-10-CM | POA: Insufficient documentation

## 2017-12-31 DIAGNOSIS — M1990 Unspecified osteoarthritis, unspecified site: Secondary | ICD-10-CM | POA: Diagnosis not present

## 2017-12-31 DIAGNOSIS — Z9889 Other specified postprocedural states: Secondary | ICD-10-CM | POA: Insufficient documentation

## 2017-12-31 DIAGNOSIS — Z90710 Acquired absence of both cervix and uterus: Secondary | ICD-10-CM | POA: Insufficient documentation

## 2017-12-31 DIAGNOSIS — Z881 Allergy status to other antibiotic agents status: Secondary | ICD-10-CM | POA: Insufficient documentation

## 2017-12-31 HISTORY — PX: IR US GUIDE BX ASP/DRAIN: IMG2392

## 2017-12-31 LAB — CBC
HCT: 42.5 % (ref 36.0–46.0)
Hemoglobin: 14 g/dL (ref 12.0–15.0)
MCH: 30 pg (ref 26.0–34.0)
MCHC: 32.9 g/dL (ref 30.0–36.0)
MCV: 91 fL (ref 78.0–100.0)
Platelets: 272 10*3/uL (ref 150–400)
RBC: 4.67 MIL/uL (ref 3.87–5.11)
RDW: 12.7 % (ref 11.5–15.5)
WBC: 7.9 10*3/uL (ref 4.0–10.5)

## 2017-12-31 LAB — APTT: aPTT: 31 seconds (ref 24–36)

## 2017-12-31 LAB — PROTIME-INR
INR: 0.88
PROTHROMBIN TIME: 11.9 s (ref 11.4–15.2)

## 2017-12-31 MED ORDER — MIDAZOLAM HCL 2 MG/2ML IJ SOLN
INTRAMUSCULAR | Status: AC
  Filled 2017-12-31: qty 2

## 2017-12-31 MED ORDER — SODIUM CHLORIDE 0.9 % IV SOLN: INTRAVENOUS | Status: DC

## 2017-12-31 MED ORDER — FENTANYL CITRATE (PF) 100 MCG/2ML IJ SOLN
INTRAMUSCULAR | Status: AC
  Filled 2017-12-31: qty 2

## 2017-12-31 MED ORDER — IOPAMIDOL (ISOVUE-300) INJECTION 61%
INTRAVENOUS | Status: AC
  Filled 2017-12-31: qty 50

## 2017-12-31 MED ORDER — LIDOCAINE HCL 1 % IJ SOLN
INTRAMUSCULAR | Status: AC
  Filled 2017-12-31: qty 20

## 2017-12-31 MED ORDER — LIDOCAINE HCL (PF) 1 % IJ SOLN
INTRAMUSCULAR | Status: AC | PRN
  Administered 2017-12-31: 5 mL

## 2017-12-31 NOTE — Progress Notes (Signed)
Patient discharged home after aspiration procedure. Mid abdominal dressing site (tegaderm and gauze) clean, dry, and intact. Patient denies any pain or discomfort at site. IV d/c'd, copy of discharge instructions given to patient. No further questions or concerns by patient or husband.

## 2017-12-31 NOTE — H&P (Signed)
Chief Complaint: Patient was seen in consultation today for abdominal wall seroma  Referring Physician(s): Dr. Johney Maine  Supervising Physician: Dr. Jacqulynn Cadet  Patient Status: [facility]- outpt  History of Present Illness: Dawn Mcguire is a 52 y.o. female with history of laparoscopic cholecystectomy in 2018 as well as open ventral hernia repair with mesh on 10/16/2017.  She subsequently developed a subcutaneous postop fluid collection at site of prior hernia repair with thin marginal enhancement. The fluid collection was aspirated on 11/28/17 and revealed no infectious component.  Patient states she achieved a few weeks relief before the collection and abdominal pressure returned.   She presents today for reassessment of the seroma with possible aspiration at the request of Dr. Johney Maine.  She has been NPO.  She does not take blood thinners.       Past Medical History:  Diagnosis Date  . Arthritis   . Carpal tunnel syndrome of left wrist   . GERD (gastroesophageal reflux disease)   . Psoriasis          Past Surgical History:  Procedure Laterality Date  . ABDOMINAL HYSTERECTOMY    . BLADDER REPAIR    . BREAST SURGERY    . CARPAL TUNNEL RELEASE     on the right wrist  . CHOLECYSTECTOMY    . INSERTION OF MESH N/A 10/16/2017   Procedure: INSERTION OF MESH;  Surgeon: Donnie Mesa, MD;  Location: [place];  Service: General;  Laterality: N/A;  . VENTRAL HERNIA REPAIR N/A 10/16/2017   Procedure: OPEN VENTRAL HERNIA REPAIR WITH MESH;  Surgeon: Donnie Mesa, MD;  Location: [place];  Service: General;  Laterality: N/A;    Allergies: Latex; Sulfa antibiotics; Sulfasalazine; Corticosteroids; Hydrocodone; Nitrofurantoin monohyd macro; Penicillins; Prednisone; Propofol; and Tramadol  Medications:        Prior to Admission medications   Medication Sig Start Date End Date Taking? Authorizing Provider  naproxen sodium (ALEVE) 220 MG tablet Take 440 mg by mouth  2 (two) times daily.    Yes [provider]  omeprazole (PRILOSEC) 20 MG capsule Take 20 mg by mouth daily.   Yes [provider]  OVER THE COUNTER MEDICATION Take 1 tablet by mouth daily. Viviscal Hair Growth Tablets (VITAMIN C/NIACIN/BIOTIN/CALCIUM/IRON/ZINC/AMINOMAR MARINE COMPLEX/SHARK CARTILAGE/OYSTER EXTRACT/HORSETAIL EXTRACT/MILLET SEED EXTRACT)   Yes [provider]  oxyCODONE (OXY IR/ROXICODONE) 5 MG immediate release tablet Take 1 tablet (5 mg total) by mouth every 6 (six) hours as needed for severe pain. 10/16/17  Yes Donnie Mesa, MD  Ascorbic Acid (VITAMIN C) 1000 MG tablet Take 1,000 mg by mouth daily.    [provider]  Cholecalciferol (VITAMIN D) 2000 units CAPS Take 2,000 Units by mouth daily.    [provider]  Cyanocobalamin (VITAMIN B-12) 2500 MCG SUBL Take 2,500 mcg by mouth daily.    [provider]  loratadine (CLARITIN) 10 MG tablet Take 10 mg by mouth daily as needed for allergies.    [provider]  promethazine (PHENERGAN) 12.5 MG tablet Take 1 tablet (12.5 mg total) by mouth every 6 (six) hours as needed for nausea or vomiting. 10/16/17   Donnie Mesa, MD          Family History  Problem Relation Age of Onset  . Arthritis Mother   . Heart attack Mother   . High blood pressure Mother   . Cancer Father   . Diabetes Father   . Arthritis Brother   . Arthritis Brother   . Arthritis Brother   .  Diabetes Son     Social History        Socioeconomic History  . Marital status: Married    Spouse name: Not on file  . Number of children: Not on file  . Years of education: Not on file  . Highest education level: Not on file  Occupational History  . Not on file  Social Needs  . Financial resource strain: Not on file  . Food insecurity:    Worry: Not on file    Inability: Not on file  . Transportation needs:    Medical: Not on file    Non-medical: Not  on file  Tobacco Use  . Smoking status: Never Smoker  . Smokeless tobacco: Never Used  Substance and Sexual Activity  . Alcohol use: No  . Drug use: No  . Sexual activity: Not on file  Lifestyle  . Physical activity:    Days per week: Not on file    Minutes per session: Not on file  . Stress: Not on file  Relationships  . Social connections:    Talks on phone: Not on file    Gets together: Not on file    Attends religious service: Not on file    Active member of club or organization: Not on file    Attends meetings of clubs or organizations: Not on file    Relationship status: Not on file  Other Topics Concern  . Not on file  Social History Narrative  . Not on file      Review of Systems see above; denies fever, headache, chest pain, dyspnea, cough, back pain, vomiting or abnormal bleeding.  Vital Signs: BP (!) 138/92 (BP Location: Right Arm)   Pulse 82   Temp 97.6 F (36.4 C) (Oral)   Resp 16   SpO2 100%   Physical Exam awake, alert.  Chest clear to auscultation bilaterally.  Heart with regular rate and rhythm.  Abdomen obese, soft, positive bowel sounds, periumbilical wound site with some mild erythema and tenderness to palpation.  No significant lower extremity edema. Imaging:  ImagingResults  Ct Abdomen Pelvis W Contrast  Result Date: 11/26/2017 CLINICAL DATA:  Swelling and fluid for 1 week. Prior ventral hernia repair, postoperative for 5 weeks. EXAM: CT ABDOMEN AND PELVIS WITH CONTRAST TECHNIQUE: Multidetector CT imaging of the abdomen and pelvis was performed using the standard protocol following bolus administration of intravenous contrast. CONTRAST:  182mL ISOVUE-300 IOPAMIDOL (ISOVUE-300) INJECTION 61% COMPARISON:  08/26/2017 FINDINGS: Lower chest: Unremarkable Hepatobiliary: Stable 7 mm hypodense lesion in the lateral segment left hepatic lobe on image 27/2. Cholecystectomy. Pancreas: Unremarkable Spleen: Unremarkable Adrenals/Urinary  Tract: Adrenal glands normal. 1-2 mm left kidney lower pole nonobstructive renal calculus on image 41/2. Otherwise unremarkable. Stomach/Bowel: 1.9 cm periampullary duodenal diverticulum, without inflammatory findings. Sigmoid diverticulosis without active diverticulitis. Vascular/Lymphatic: Unremarkable Reproductive: Stable 8 mm hyperdense lesion along the left side of the vaginal cuff, possibly a proteinaceous cyst. Adnexa unremarkable. Other: No supplemental non-categorized findings. Musculoskeletal: At the site of the prior hernia there is a 6.7 by 2.5 by 5.5 cm (volume = 48 cm^3) fluid collection with internal density 12 Hounsfield units, and faint and relatively thin enhancement along its margins. Underlying hernia mesh noted along the underlying omentum, without appreciable herniation around the mesh. Small bilateral groin hernias are probably direct inguinal hernias but are not entirely specific for direct versus indirect etiology. Degenerative disc disease at L3-4, L4-5, and L5-S1 with loss of disc height and potentially mild levels of foraminal  impingement at L3-4 and L4-5. IMPRESSION: 1. Subcutaneous postoperative fluid collection at the site of the prior hernia repair, measuring about 48 cubic cm in volume, with thin marginal enhancement. This may simply represent a postoperative seroma or resolving postoperative hematoma. No internal gas or thick margination to further suggest abscess. The hernia mesh is visualized in the expected location without obvious herniation of abdominal contents in the postoperative region. 2. Small bilateral groin hernias containing adipose tissue. 3. Nonobstructive 1-2 mm left kidney lower pole calculus. 4. There is potentially mild foraminal impingement at L3-4 and L4-5. 5. Sigmoid colon diverticulosis. Electronically Signed   By: Van Clines M.D.   On: 11/26/2017 09:10     Labs: CBC    Component Value Date/Time   WBC 7.9 12/31/2017 0700   RBC 4.67  12/31/2017 0700   HGB 14.0 12/31/2017 0700   HCT 42.5 12/31/2017 0700   PLT 272 12/31/2017 0700   MCV 91.0 12/31/2017 0700   MCH 30.0 12/31/2017 0700   MCHC 32.9 12/31/2017 0700   RDW 12.7 12/31/2017 0700   LYMPHSABS 2.4 11/28/2017 1121   MONOABS 0.5 11/28/2017 1121   EOSABS 0.1 11/28/2017 1121   BASOSABS 0.0 11/28/2017 1121    Ref Range & Units 07:00 10mo ago  Prothrombin Time 11.4 - 15.2 seconds 11.9  11.8   INR  0.88  0.88 CM     Assessment and Plan: Patient with past medical history of psoriasis, GERD presents with complaint of abdominal wall seroma.  IR consulted for aspiration at the request of Dr. Johney Maine. Case reviewed by Dr. Laurence Ferrari who approves patient for procedure.  Patient presents today in their usual state of health.  She has been NPO and is not currently on blood thinners.   Risks and benefits discussed with the patient including bleeding, infection, damage to adjacent structures, and sepsis.  All of the patient's questions were answered, patient is agreeable to proceed. Consent signed and in chart.   Thank you for this interesting consult.  I greatly enjoyed meeting Dawn Mcguire and look forward to participating in their care.  A copy of this report was sent to the requesting provider on this date.  Electronically Signed: Brynda Greathouse, MS RD PA-C 8:22 AM   I spent a total of 20 minutes    in face to face in clinical consultation, greater than 50% of which was counseling/coordinating care for image guided aspiration and possible drainage of abdominal wall fluid collection

## 2017-12-31 NOTE — Discharge Instructions (Signed)
Incision and Drainage Incision and drainage is a surgical procedure to open and drain a fluid-filled sac. The sac may be filled with pus, mucus, or blood. Examples of fluid-filled sacs that may need surgical drainage include cysts, skin infections (abscesses), and red lumps that develop from a ruptured cyst or a small abscess (boils). You may need this procedure if the affected area is large, painful, infected, or not healing well. Tell a health care provider about:  Any allergies you have.  All medicines you are taking, including vitamins, herbs, eye drops, creams, and over-the-counter medicines.  Any problems you or family members have had with anesthetic medicines.  Any blood disorders you have.  Any surgeries you have had.  Any medical conditions you have.  Whether you are pregnant or may be pregnant. What are the risks? Generally, this is a safe procedure. However, problems may occur, including:  Infection.  Bleeding.  Allergic reactions to medicines.  Scarring.  What happens before the procedure?  You may need an ultrasound or other imaging tests to see how large or deep the fluid-filled sac is.  You may have blood tests to check for infection.  You may get a tetanus shot.  You may be given antibiotic medicine to help prevent infection.  Follow instructions from your health care provider about eating or drinking restrictions.  Ask your health care provider about: ? Changing or stopping your regular medicines. This is especially important if you are taking diabetes medicines or blood thinners. ? Taking medicines such as aspirin and ibuprofen. These medicines can thin your blood. Do not take these medicines before your procedure if your health care provider instructs you not to.  Plan to have someone take you home after the procedure.  If you will be going home right after the procedure, plan to have someone stay with you for 24 hours. What happens during the  procedure?  To reduce your risk of infection: ? Your health care team will wash or sanitize their hands. ? Your skin will be washed with soap.  You will be given one or more of the following: ? A medicine to help you relax (sedative). ? A medicine to numb the area (local anesthetic). ? A medicine to make you fall asleep (general anesthetic).  An incision will be made in the top of the fluid-filled sac.  The contents of the sac may be squeezed out, or a syringe or tube (catheter)may be used to empty the sac.  The catheter may be left in place for several weeks to drain any fluid. Or, your health care provider may stitch open the edges of the incision to make a long-term opening for drainage (marsupialization).  The inside of the sac may be washed out (irrigated) with a sterile solution and packed with gauze before it is covered with a bandage (dressing). The procedure may vary among health care providers and hospitals. What happens after the procedure?  Your blood pressure, heart rate, breathing rate, and blood oxygen level will be monitored often until the medicines you were given have worn off.  Do not drive for 24 hours if you received a sedative. This information is not intended to replace advice given to you by your health care provider. Make sure you discuss any questions you have with your health care provider. Document Released: 01/08/2001 Document Revised: 12/21/2015 Document Reviewed: 05/05/2015 Elsevier Interactive Patient Education  2018 [employer]  

## 2017-12-31 NOTE — Procedures (Signed)
Interventional Radiology Procedure Note  Procedure: US guided aspiration small abdominal wall seroma yielding 2-3 mL serosanguinous fluid.   Complications: None  Estimated Blood Loss: None  Recommendations: - DC home   Signed,  Criselda Peaches, MD

## 2018-01-19 ENCOUNTER — Ambulatory Visit: Payer: Self-pay

## 2018-01-19 NOTE — Telephone Encounter (Signed)
Rec'd phone call from pt. with c/o exposure to [facility].  Stated she feels certain this was transferred from her granddog's paw.  C/o an area on right right rib cage approx. 4" x 2" area, and 2" x 2" area on right arm in 2 patches.  Reported using Caladryl lotion, and oral Benadryl.  Stated the itching has improved, but the  areas do not appear to be improving/ drying up.  Denied any blistering.  Stated there is a dark red rash on rib cage, and a lighter red rash on arms.  Denied any signs of rash on face/ neck.  Asking about an Rx for steroid cream.  Appt. given for 6/25 with PCP.  Care advice given per protocol.  Pt. verb. understanding, and agrees with plan.    Reason for Disposition . SEVERE itching (e.g., interferes with sleep or normal activities)    C/o moderate itching and has 2-3 patches that have spread since Friday  Answer Assessment - Initial Assessment Questions 1. APPEARANCE of RASH: "Describe the rash."      Dark Red bumps on rib cage, less red on arm, but rashy 2. LOCATION: "Where is the rash located?"      Right rib cage and right arm  3. SIZE: "How large is the rash?"      4 " x 2" area on rib cage, and 2" x 2" area on arm   4. ONSET: "When did the rash begin?"      Friday morning 5. ITCHING: "Does the rash itch?" If so, ask: "How bad is it?"   - MILD - doesn't interfere with normal activities   - MODERATE-SEVERE: interferes with work, school, sleep, or other activities     Moderate  6. PREGNANCY: "Is there any chance you are pregnant?" "When was your last menstrual period?"     No; had hysterectomy  Protocols used: [facility] - [facility] - [facility]AC-A-AH

## 2018-01-20 ENCOUNTER — Ambulatory Visit: Payer: BLUE CROSS/BLUE SHIELD | Admitting: Family Medicine

## 2018-01-20 ENCOUNTER — Encounter: Payer: Self-pay | Admitting: Family Medicine

## 2018-01-20 VITALS — BP 130/80 | HR 76 | Ht 68.0 in

## 2018-01-20 DIAGNOSIS — L237 Allergic contact dermatitis due to plants, except food: Secondary | ICD-10-CM

## 2018-01-20 DIAGNOSIS — L255 Unspecified contact dermatitis due to plants, except food: Secondary | ICD-10-CM | POA: Insufficient documentation

## 2018-01-20 MED ORDER — METHYLPREDNISOLONE ACETATE 80 MG/ML IJ SUSP
80.0000 mg | Freq: Once | INTRAMUSCULAR | Status: AC
  Administered 2018-01-20: 80 mg via INTRAMUSCULAR

## 2018-01-20 NOTE — Progress Notes (Signed)
Subjective:  Patient ID: Jenni Thew, female    DOB: Jul 09, 1966  Age: 52 y.o. MRN: [id number]  CC: poison oak breakout   HPI Vicy Medico presents for evaluation of a 5-day history of a pruritic rash on her neck abdomen and extremities she believes that she got it from her son's puppy after he had run through the woods.  The rash seems to be spreading.  She has tried no medicines for it.  She has taken prednisone in the past but developed swelling from it.  She asks about over-the-counter products to help her lose weight.  She has had some success with weight loss since the first of the year.  Outpatient Medications Prior to Visit  Medication Sig Dispense Refill  . Ascorbic Acid (VITAMIN C) 1000 MG tablet Take 1,000 mg by mouth daily.    . Cholecalciferol (VITAMIN D) 2000 units CAPS Take 2,000 Units by mouth daily.    . Cyanocobalamin (VITAMIN B-12) 2500 MCG SUBL Take 2,500 mcg by mouth daily.    Marland Kitchen loratadine (CLARITIN) 10 MG tablet Take 10 mg by mouth daily as needed for allergies.    . naproxen sodium (ALEVE) 220 MG tablet Take 440 mg by mouth 2 (two) times daily.     Marland Kitchen omeprazole (PRILOSEC) 20 MG capsule Take 20 mg by mouth daily.    Marland Kitchen OVER THE COUNTER MEDICATION Take 1 tablet by mouth daily. Viviscal Hair Growth Tablets (VITAMIN C/NIACIN/BIOTIN/CALCIUM/IRON/ZINC/AMINOMAR MARINE COMPLEX/SHARK CARTILAGE/OYSTER EXTRACT/HORSETAIL EXTRACT/MILLET SEED EXTRACT)    . oxyCODONE (OXY IR/ROXICODONE) 5 MG immediate release tablet Take 1 tablet (5 mg total) by mouth every 6 (six) hours as needed for severe pain. 20 tablet 0  . promethazine (PHENERGAN) 12.5 MG tablet Take 1 tablet (12.5 mg total) by mouth every 6 (six) hours as needed for nausea or vomiting. 30 tablet 0   No facility-administered medications prior to visit.     ROS Review of Systems  Constitutional: Negative.  Negative for chills, fatigue, fever and unexpected weight change.  Respiratory: Negative.   Cardiovascular:  Negative.   Gastrointestinal: Negative.   Musculoskeletal: Negative for arthralgias and myalgias.  Skin: Positive for color change and rash. Negative for pallor and wound.  Hematological: Does not bruise/bleed easily.  Psychiatric/Behavioral: Negative.     Objective:  BP 130/80   Pulse 76   Ht 5\' 8"  (1.727 m)   SpO2 98%   BMI 36.64 kg/m   BP Readings from Last 3 Encounters:  01/20/18 130/80  12/31/17 (!) 136/95  11/28/17 116/76    Wt Readings from Last 3 Encounters:  12/31/17 241 lb (109.3 kg)  10/16/17 254 lb (115.2 kg)  10/06/17 254 lb 6.4 oz (115.4 kg)    Physical Exam  Constitutional: She is oriented to person, place, and time. She appears well-developed and well-nourished. No distress.  HENT:  Head: Normocephalic and atraumatic.  Right Ear: External ear normal.  Left Ear: External ear normal.  Eyes: Right eye exhibits no discharge. Left eye exhibits no discharge. No scleral icterus.  Neck: No tracheal deviation present. No thyromegaly present.  Pulmonary/Chest: Effort normal.  Neurological: She is alert and oriented to person, place, and time.  Skin: Skin is warm and dry. Rash noted. She is not diaphoretic. There is erythema.     Psychiatric: She has a normal mood and affect. Her behavior is normal.    Lab Results  Component Value Date   WBC 7.9 12/31/2017   HGB 14.0 12/31/2017   HCT 42.5 12/31/2017  PLT 272 12/31/2017   GLUCOSE 90 11/28/2017   NA 140 11/28/2017   K 3.8 11/28/2017   CL 106 11/28/2017   CREATININE 0.72 11/28/2017   BUN 14 11/28/2017   CO2 23 11/28/2017   INR 0.88 12/31/2017    Ir US Guide Bx Asp/drain  Result Date: 12/31/2017 INDICATION: 52 year old female with a history of ventral incisional hernia status post repair complicated by recurrent superficial seroma. She presents for ultrasound evaluation and possible aspiration versus drain placement depending on the volume of recurrent fluid. EXAM: Ultrasound-guided aspiration  MEDICATIONS: None ANESTHESIA/SEDATION: None COMPLICATIONS: None immediate. PROCEDURE: Informed written consent was obtained from the patient after a thorough discussion of the procedural risks, benefits and alternatives. All questions were addressed. A timeout was performed prior to the initiation of the procedure. The anterior abdominal wall was interrogated with ultrasound in the region of the clinical concern. There is a small anechoic fluid collection subjacent to the incision measuring approximately 2.7 x 0.5 x 1.4 cm (volume = 1 cm^3). A suitable skin entry site was selected. Following sterile prep and drape in the standard fashion with chlorhexidine skin prep, local anesthesia was attained by infiltration with 1% lidocaine. A small Mulder metopic me was made. Under real-time ultrasound guidance, an 18 gauge trocar needle was carefully advanced into the fluid collection. Aspiration was performed yielding approximately 2 mL of serosanguineous fluid. There was no evidence of purulence or turbidity to suggest the presence of infection. The patient tolerated the procedure well. IMPRESSION: Successful ultrasound-guided aspiration of very small recurrent superficial subcutaneous seroma yielding approximately 2 mL of serosanguineous fluid. Electronically Signed   By: Jacqulynn Cadet M.D.   On: 12/31/2017 09:56    Assessment & Plan:   Pualani was seen today for poison oak breakout.  Diagnoses and all orders for this visit:  Poison oak -     methylPREDNISolone acetate (DEPO-MEDROL) injection 80 mg   I have discontinued Anae Hundertmark's oxyCODONE and promethazine. I am also having her maintain her naproxen sodium, loratadine, Vitamin D, Vitamin B-12, vitamin C, omeprazole, and OVER THE COUNTER MEDICATION. We administered methylPREDNISolone acetate.  Meds ordered this encounter  Medications  . methylPREDNISolone acetate (DEPO-MEDROL) injection 80 mg   Discussed options for treating her dermatitis  and we elected to go with Depo-Medrol.  She is aware that it may take the injection 2 to 3 days to become effective for her.  She will try alli or weight loss.  Reminded her that she has fasting blood work pending that is ordered and she just needs to come in and have that drawn.  Follow-up: Return in about 5 days (around 01/25/2018), or if symptoms worsen or fail to improve.  Libby Maw, MD

## 2018-06-08 ENCOUNTER — Encounter: Payer: Self-pay | Admitting: Family Medicine

## 2018-06-08 ENCOUNTER — Ambulatory Visit (INDEPENDENT_AMBULATORY_CARE_PROVIDER_SITE_OTHER): Payer: BLUE CROSS/BLUE SHIELD | Admitting: Family Medicine

## 2018-06-08 VITALS — BP 128/80 | HR 61 | Temp 97.4°F | Ht 68.0 in | Wt 259.0 lb

## 2018-06-08 DIAGNOSIS — R102 Pelvic and perineal pain: Secondary | ICD-10-CM | POA: Diagnosis not present

## 2018-06-08 DIAGNOSIS — R8290 Unspecified abnormal findings in urine: Secondary | ICD-10-CM | POA: Diagnosis not present

## 2018-06-08 DIAGNOSIS — H6983 Other specified disorders of Eustachian tube, bilateral: Secondary | ICD-10-CM

## 2018-06-08 DIAGNOSIS — J0110 Acute frontal sinusitis, unspecified: Secondary | ICD-10-CM | POA: Diagnosis not present

## 2018-06-08 DIAGNOSIS — H6981 Other specified disorders of Eustachian tube, right ear: Secondary | ICD-10-CM | POA: Insufficient documentation

## 2018-06-08 DIAGNOSIS — Z889 Allergy status to unspecified drugs, medicaments and biological substances status: Secondary | ICD-10-CM

## 2018-06-08 LAB — POCT URINALYSIS DIPSTICK
Bilirubin, UA: NEGATIVE
Glucose, UA: NEGATIVE
KETONES UA: NEGATIVE
Leukocytes, UA: NEGATIVE
NITRITE UA: NEGATIVE
PH UA: 6.5 (ref 5.0–8.0)
PROTEIN UA: NEGATIVE
SPEC GRAV UA: 1.02 (ref 1.010–1.025)
UROBILINOGEN UA: 0.2 U/dL

## 2018-06-08 MED ORDER — CLARITHROMYCIN ER 500 MG PO TB24: 1000.0000 mg | ORAL_TABLET | Freq: Every day | ORAL | 0 refills | Status: AC

## 2018-06-08 NOTE — Progress Notes (Signed)
Subjective:  Patient ID: Dawn Mcguire, female    DOB: Mar 07, 1966  Age: 52 y.o. MRN: [id number]  CC: Urinary Tract Infection and Sinus Problem   HPI Kaye Luoma presents for evaluation of a 3-day history of suprapubic pressure with lower back pain status post somewhat painful intercourse on night prior.  Patient is married stable relationship with an active sex life.  Patient denies dysuria frequency or urgency.  There is been no nausea or vomiting fever or chills.  She has had over a weeklong history of nasal congestion postnasal drip and now has frontal sinus pressure with some discolored rhinorrhea.  Her ears have been congested as well.  She has been taking nasal decongestants.  She has increased her fluid intake.  Denies any type of back injury or heavy lifting.  Outpatient Medications Prior to Visit  Medication Sig Dispense Refill  . Ascorbic Acid (VITAMIN C) 1000 MG tablet Take 1,000 mg by mouth daily.    . Cholecalciferol (VITAMIN D) 2000 units CAPS Take 2,000 Units by mouth daily.    . Cyanocobalamin (VITAMIN B-12) 2500 MCG SUBL Take 2,500 mcg by mouth daily.    Marland Kitchen loratadine (CLARITIN) 10 MG tablet Take 10 mg by mouth daily as needed for allergies.    . naproxen sodium (ALEVE) 220 MG tablet Take 440 mg by mouth 2 (two) times daily.     Marland Kitchen omeprazole (PRILOSEC) 20 MG capsule Take 20 mg by mouth daily.    Marland Kitchen OVER THE COUNTER MEDICATION Take 1 tablet by mouth daily. Viviscal Hair Growth Tablets (VITAMIN C/NIACIN/BIOTIN/CALCIUM/IRON/ZINC/AMINOMAR MARINE COMPLEX/SHARK CARTILAGE/OYSTER EXTRACT/HORSETAIL EXTRACT/MILLET SEED EXTRACT)     No facility-administered medications prior to visit.     ROS Review of Systems  Constitutional: Positive for fatigue. Negative for chills, fever and unexpected weight change.  HENT: Positive for congestion, hearing loss, postnasal drip, rhinorrhea, sinus pressure and sinus pain. Negative for ear discharge and ear pain.   Eyes: Negative for  photophobia and visual disturbance.  Respiratory: Negative for cough, shortness of breath and wheezing.   Cardiovascular: Negative.   Gastrointestinal: Negative.   Endocrine: Negative for polyphagia and polyuria.  Genitourinary: Positive for dyspareunia and vaginal pain. Negative for difficulty urinating, dysuria, flank pain, frequency, hematuria, pelvic pain, urgency, vaginal bleeding and vaginal discharge.  Musculoskeletal: Positive for back pain. Negative for gait problem and joint swelling.  Skin: Negative.   Allergic/Immunologic: Negative for immunocompromised state.  Neurological: Negative for light-headedness and numbness.  Hematological: Does not bruise/bleed easily.  Psychiatric/Behavioral: Negative.     Objective:  BP 128/80 (BP Location: Right Arm, Patient Position: Sitting, Cuff Size: Normal)   Pulse 61   Temp (!) 97.4 F (36.3 C) (Oral)   Ht 5\' 8"  (1.727 m)   Wt 259 lb (117.5 kg)   SpO2 97%   BMI 39.38 kg/m   BP Readings from Last 3 Encounters:  06/08/18 128/80  01/20/18 130/80  12/31/17 (!) 136/95    Wt Readings from Last 3 Encounters:  06/08/18 259 lb (117.5 kg)  12/31/17 241 lb (109.3 kg)  10/16/17 254 lb (115.2 kg)    Physical Exam  Constitutional: She is oriented to person, place, and time. She appears well-developed and well-nourished. No distress.  HENT:  Head: Normocephalic and atraumatic.  Right Ear: External ear normal. Tympanic membrane is retracted. Tympanic membrane is not injected, not perforated and not erythematous.  Left Ear: External ear normal. Tympanic membrane is retracted. Tympanic membrane is not injected, not perforated and not erythematous.  Mouth/Throat:  Oropharynx is clear and moist. No oropharyngeal exudate.  Eyes: Pupils are equal, round, and reactive to light. Conjunctivae and EOM are normal. Right eye exhibits no discharge. Left eye exhibits no discharge. No scleral icterus.  Neck: No JVD present. No tracheal deviation present.  No thyromegaly present.  Cardiovascular: Normal rate, regular rhythm and normal heart sounds.  Pulmonary/Chest: Effort normal and breath sounds normal.  Musculoskeletal:       Lumbar back: She exhibits decreased range of motion and tenderness. She exhibits no bony tenderness.  Lymphadenopathy:    She has no cervical adenopathy.  Neurological: She is alert and oriented to person, place, and time.  Skin: Skin is warm and dry. She is not diaphoretic.  Psychiatric: She has a normal mood and affect. Her behavior is normal.    Lab Results  Component Value Date   WBC 7.9 12/31/2017   HGB 14.0 12/31/2017   HCT 42.5 12/31/2017   PLT 272 12/31/2017   GLUCOSE 90 11/28/2017   NA 140 11/28/2017   K 3.8 11/28/2017   CL 106 11/28/2017   CREATININE 0.72 11/28/2017   BUN 14 11/28/2017   CO2 23 11/28/2017   INR 0.88 12/31/2017    Ir US Guide Bx Asp/drain  Result Date: 12/31/2017 INDICATION: 52 year old female with a history of ventral incisional hernia status post repair complicated by recurrent superficial seroma. She presents for ultrasound evaluation and possible aspiration versus drain placement depending on the volume of recurrent fluid. EXAM: Ultrasound-guided aspiration MEDICATIONS: None ANESTHESIA/SEDATION: None COMPLICATIONS: None immediate. PROCEDURE: Informed written consent was obtained from the patient after a thorough discussion of the procedural risks, benefits and alternatives. All questions were addressed. A timeout was performed prior to the initiation of the procedure. The anterior abdominal wall was interrogated with ultrasound in the region of the clinical concern. There is a small anechoic fluid collection subjacent to the incision measuring approximately 2.7 x 0.5 x 1.4 cm (volume = 1 cm^3). A suitable skin entry site was selected. Following sterile prep and drape in the standard fashion with chlorhexidine skin prep, local anesthesia was attained by infiltration with 1% lidocaine. A  small Mulder metopic me was made. Under real-time ultrasound guidance, an 18 gauge trocar needle was carefully advanced into the fluid collection. Aspiration was performed yielding approximately 2 mL of serosanguineous fluid. There was no evidence of purulence or turbidity to suggest the presence of infection. The patient tolerated the procedure well. IMPRESSION: Successful ultrasound-guided aspiration of very small recurrent superficial subcutaneous seroma yielding approximately 2 mL of serosanguineous fluid. Electronically Signed   By: Jacqulynn Cadet M.D.   On: 12/31/2017 09:56    Assessment & Plan:   Hayzel was seen today for urinary tract infection and sinus problem.  Diagnoses and all orders for this visit:  Bad odor of urine -     POCT urinalysis dipstick  Acute non-recurrent frontal sinusitis -     clarithromycin (BIAXIN XL) 500 MG 24 hr tablet; Take 2 tablets (1,000 mg total) by mouth daily for 7 days.  Suprapubic pressure -     Urine Culture  Drug allergy, multiple -     Ambulatory referral to Allergy  Dysfunction of both eustachian tubes   I am having Laiya Bahena start on clarithromycin. I am also having her maintain her naproxen sodium, loratadine, Vitamin D, Vitamin B-12, vitamin C, omeprazole, and OVER THE COUNTER MEDICATION.  Meds ordered this encounter  Medications  . clarithromycin (BIAXIN XL) 500 MG 24 hr tablet  Sig: Take 2 tablets (1,000 mg total) by mouth daily for 7 days.    Dispense:  14 tablet    Refill:  0   We will treat patient's sinuses with 7 days of Biaxin.  She will continue to take nasal decongestants for stuffy ears.  We will send for allergist referral to assess for multiple drug allergies.  She will return if she does not improve with the Biaxin therapy.  Urine culture has been  Follow-up: Return if symptoms worsen or fail to improve.  Libby Maw, MD

## 2018-06-08 NOTE — Patient Instructions (Signed)
Pelvic Pain, Female Pelvic pain is pain in your lower abdomen, below your belly button and between your hips. The pain may start suddenly (acute), keep coming back (recurring), or last a long time (chronic). Pelvic pain that lasts longer than six months is considered chronic. Pelvic pain may affect your:  Reproductive organs.  Urinary system.  Digestive tract.  Musculoskeletal system.  There are many potential causes of pelvic pain. Sometimes, the pain can be a result of digestive or urinary conditions, strained muscles or ligaments, or even reproductive conditions. Sometimes the cause of pelvic pain is not known. Follow these instructions at home:  Take over-the-counter and prescription medicines only as told by your health care provider.  Rest as told by your health care provider.  Do not have sex it if hurts.  Keep a journal of your pelvic pain. Write down: ? When the pain started. ? Where the pain is located. ? What seems to make the pain better or worse, such as food or your menstrual cycle. ? Any symptoms you have along with the pain.  Keep all follow-up visits as told by your health care provider. This is important. Contact a health care provider if:  Medicine does not help your pain.  Your pain comes back.  You have new symptoms.  You have abnormal vaginal discharge or bleeding, including bleeding after menopause.  You have a fever or chills.  You are constipated.  You have blood in your urine or stool.  You have foul-smelling urine.  You feel weak or lightheaded. Get help right away if:  You have sudden severe pain.  Your pain gets steadily worse.  You have severe pain along with fever, nausea, vomiting, or excessive sweating.  You lose consciousness. This information is not intended to replace advice given to you by your health care provider. Make sure you discuss any questions you have with your health care provider. Document Released: 06/11/2004  Document Revised: 08/09/2015 Document Reviewed: 05/05/2015 Elsevier Interactive Patient Education  2018 [employer]  

## 2018-06-10 LAB — URINE CULTURE
MICRO NUMBER:: 91355182
SPECIMEN QUALITY: ADEQUATE

## 2018-07-03 ENCOUNTER — Ambulatory Visit: Payer: Self-pay | Admitting: Allergy

## 2018-11-16 ENCOUNTER — Telehealth: Payer: Self-pay

## 2018-11-16 ENCOUNTER — Ambulatory Visit (INDEPENDENT_AMBULATORY_CARE_PROVIDER_SITE_OTHER): Payer: Self-pay | Admitting: Family Medicine

## 2018-11-16 ENCOUNTER — Encounter: Payer: Self-pay | Admitting: Family Medicine

## 2018-11-16 VITALS — Ht 68.0 in

## 2018-11-16 DIAGNOSIS — H6983 Other specified disorders of Eustachian tube, bilateral: Secondary | ICD-10-CM

## 2018-11-16 DIAGNOSIS — H6993 Unspecified Eustachian tube disorder, bilateral: Secondary | ICD-10-CM

## 2018-11-16 DIAGNOSIS — B349 Viral infection, unspecified: Secondary | ICD-10-CM

## 2018-11-16 NOTE — Telephone Encounter (Signed)
Returned call and scheduled appointment.   Copied from [place] [phone]. Topic: Appointment Scheduling - Scheduling Inquiry for Clinic >> Nov 16, 2018 10:04 AM Virl Axe D wrote: Reason for CRM: Pt stated she needs to schedule a virtual follow up appointment with Dr. Ethelene Hal for Friday. Agent tried scheduling line 3x with no answer. Please reach out to pt. She is okay with being scheduled and having someone call her with the time. Please advise.

## 2018-11-16 NOTE — Progress Notes (Signed)
Established Patient Office Visit  Subjective:  Patient ID: Dawn Mcguire, female    DOB: 1966/05/13  Age: 52 y.o. MRN: [id number]  CC:  Chief Complaint  Patient presents with  . Sinus Problem    no fever, congestion, ear pain    HPI Dawn Mcguire presents for evaluation and treatment of a 2-day history of nasal congestion, yellow rhinorrhea postnasal drip, ear congestion and discomfort, myalgias in the back of her neck.  At this point there is been little cough.  There is been no fever or chills.  Denies wheezing or reactive airway disease.  Has tried Claritin and Mucinex DM.  Past Medical History:  Diagnosis Date  . Arthritis   . Carpal tunnel syndrome of left wrist   . GERD (gastroesophageal reflux disease)   . Psoriasis     Past Surgical History:  Procedure Laterality Date  . ABDOMINAL HYSTERECTOMY    . BLADDER REPAIR    . BREAST SURGERY    . CARPAL TUNNEL RELEASE     on the right wrist  . CHOLECYSTECTOMY    . INSERTION OF MESH N/A 10/16/2017   Procedure: INSERTION OF MESH;  Surgeon: Donnie Mesa, MD;  Location: [place];  Service: General;  Laterality: N/A;  . IR US GUIDE BX ASP/DRAIN  12/31/2017  . VENTRAL HERNIA REPAIR N/A 10/16/2017   Procedure: OPEN VENTRAL HERNIA REPAIR WITH MESH;  Surgeon: Donnie Mesa, MD;  Location: [place];  Service: General;  Laterality: N/A;    Family History  Problem Relation Age of Onset  . Arthritis Mother   . Heart attack Mother   . High blood pressure Mother   . Cancer Father   . Diabetes Father   . Arthritis Brother   . Arthritis Brother   . Arthritis Brother   . Diabetes Son     Social History   Socioeconomic History  . Marital status: Married    Spouse name: Not on file  . Number of children: Not on file  . Years of education: Not on file  . Highest education level: Not on file  Occupational History  . Not on file  Social Needs  . Financial resource strain: Not on file  . Food insecurity:    Worry: Not on file     Inability: Not on file  . Transportation needs:    Medical: Not on file    Non-medical: Not on file  Tobacco Use  . Smoking status: Never Smoker  . Smokeless tobacco: Never Used  Substance and Sexual Activity  . Alcohol use: No  . Drug use: No  . Sexual activity: Yes    Birth control/protection: Surgical  Lifestyle  . Physical activity:    Days per week: Not on file    Minutes per session: Not on file  . Stress: Not on file  Relationships  . Social connections:    Talks on phone: Not on file    Gets together: Not on file    Attends religious service: Not on file    Active member of club or organization: Not on file    Attends meetings of clubs or organizations: Not on file    Relationship status: Not on file  . Intimate partner violence:    Fear of current or ex partner: Not on file    Emotionally abused: Not on file    Physically abused: Not on file    Forced sexual activity: Not on file  Other Topics Concern  . Not on  file  Social History Narrative  . Not on file    Outpatient Medications Prior to Visit  Medication Sig Dispense Refill  . Ascorbic Acid (VITAMIN C) 1000 MG tablet Take 1,000 mg by mouth daily.    . naproxen sodium (ALEVE) 220 MG tablet Take 440 mg by mouth 2 (two) times daily.     Marland Kitchen omeprazole (PRILOSEC) 20 MG capsule Take 20 mg by mouth daily.    Marland Kitchen OVER THE COUNTER MEDICATION Take 1 tablet by mouth daily. Viviscal Hair Growth Tablets (VITAMIN C/NIACIN/BIOTIN/CALCIUM/IRON/ZINC/AMINOMAR MARINE COMPLEX/SHARK CARTILAGE/OYSTER EXTRACT/HORSETAIL EXTRACT/MILLET SEED EXTRACT)    . Cholecalciferol (VITAMIN D) 2000 units CAPS Take 2,000 Units by mouth daily.    . Cyanocobalamin (VITAMIN B-12) 2500 MCG SUBL Take 2,500 mcg by mouth daily.    Marland Kitchen loratadine (CLARITIN) 10 MG tablet Take 10 mg by mouth daily as needed for allergies.     No facility-administered medications prior to visit.     Allergies  Allergen Reactions  . Latex     Tears skin and break out   . Sulfa Antibiotics Itching and Nausea And Vomiting    Skin becomes red and feels like it is burning  . Sulfasalazine Hives, Itching and Nausea And Vomiting    Skin becomes red and feels like it is burning Skin becomes red and feels like it is burning  . Corticosteroids Hives, Itching and Rash    fever  . Hydrocodone Nausea And Vomiting  . Nitrofurantoin Monohyd Macro Nausea And Vomiting  . Penicillins Hives, Itching and Rash    Has patient had a PCN reaction causing immediate rash, facial/tongue/throat swelling, SOB or lightheadedness with hypotension: No Has patient had a PCN reaction causing severe rash involving mucus membranes or skin necrosis: No Has patient had a PCN reaction that required hospitalization: No Has patient had a PCN reaction occurring within the last 10 years: No If all of the above answers are "NO", then may proceed with Cephalosporin use.   . Prednisone Hives, Itching and Other (See Comments)    fever  . Propofol Hives, Rash and Other (See Comments)    Fever and hair loss  . Tramadol Nausea Only    ROS Review of Systems  Constitutional: Negative for chills, diaphoresis, fatigue, fever and unexpected weight change.  HENT: Positive for congestion, ear pain, postnasal drip and rhinorrhea. Negative for dental problem, sinus pressure, trouble swallowing and voice change.   Respiratory: Negative for cough, shortness of breath and wheezing.   Cardiovascular: Negative.   Gastrointestinal: Negative.   Genitourinary: Negative.   Musculoskeletal: Positive for myalgias and neck pain. Negative for arthralgias and neck stiffness.  Skin: Negative for rash.  Allergic/Immunologic: Negative for immunocompromised state.  Neurological: Negative for headaches.  Hematological: Does not bruise/bleed easily.  Psychiatric/Behavioral: Negative.       Objective:    Physical Exam  Constitutional: She is oriented to person, place, and time. She appears well-developed and  well-nourished. No distress.  HENT:  Head: Normocephalic and atraumatic.  Right Ear: External ear normal.  Left Ear: External ear normal.  Eyes: Right eye exhibits no discharge. Left eye exhibits no discharge. No scleral icterus.  Neck: No JVD present. No tracheal deviation present.  Pulmonary/Chest: Effort normal. No stridor.  Neurological: She is alert and oriented to person, place, and time.  Skin: She is not diaphoretic.  Psychiatric: She has a normal mood and affect. Her behavior is normal.    Ht 5\' 8"  (1.727 m)   BMI 39.38 kg/m  Wt Readings from Last 3 Encounters:  06/08/18 259 lb (117.5 kg)  12/31/17 241 lb (109.3 kg)  10/16/17 254 lb (115.2 kg)     Health Maintenance Due  Topic Date Due  . Hepatitis C Screening  1966-02-28  . HIV Screening  09/05/1978  . TETANUS/TDAP  09/05/1982  . PAP SMEAR-Modifier  09/05/1984  . MAMMOGRAM  09/05/2013  . COLONOSCOPY  09/05/2013    There are no preventive care reminders to display for this patient.  No results found for: TSH Lab Results  Component Value Date   WBC 7.9 12/31/2017   HGB 14.0 12/31/2017   HCT 42.5 12/31/2017   MCV 91.0 12/31/2017   PLT 272 12/31/2017   Lab Results  Component Value Date   NA 140 11/28/2017   K 3.8 11/28/2017   CO2 23 11/28/2017   GLUCOSE 90 11/28/2017   BUN 14 11/28/2017   CREATININE 0.72 11/28/2017   CALCIUM 9.2 11/28/2017   ANIONGAP 11 11/28/2017   No results found for: CHOL No results found for: HDL No results found for: LDLCALC No results found for: TRIG No results found for: CHOLHDL No results found for: HGBA1C    Assessment & Plan:   Problem List Items Addressed This Visit      Nervous and Auditory   Dysfunction of both eustachian tubes     Other   Nonspecific syndrome suggestive of viral illness - Primary      No orders of the defined types were placed in this encounter.   Follow-up: Return in about 4 days (around 11/20/2018).    Libby Maw,  MDVirtual Visit via Video Note  I connected with Dawn Mcguire on 11/16/18 at  9:00 AM EDT by a video enabled telemedicine application and verified that I am speaking with the correct person using two identifiers.   I discussed the limitations of evaluation and management by telemedicine and the availability of in person appointments. The patient expressed understanding and agreed to proceed.  History of Present Illness:    Observations/Objective:   Assessment and Plan:   Follow Up Instructions:    I discussed the assessment and treatment plan with the patient. The patient was provided an opportunity to ask questions and all were answered. The patient agreed with the plan and demonstrated an understanding of the instructions.   The patient was advised to call back or seek an in-person evaluation if the symptoms worsen or if the condition fails to improve as anticipated.  I provided 15 minutes of non-face-to-face time during this encounter.  Patient will discontinue Claritin and Mucinex DM.  She will start Mucinex D along with Flonase.  She will make a follow-up appointment with me on Friday for reevaluation.

## 2018-11-19 ENCOUNTER — Emergency Department (HOSPITAL_COMMUNITY): Payer: Self-pay

## 2018-11-19 ENCOUNTER — Telehealth: Payer: Self-pay | Admitting: Family Medicine

## 2018-11-19 ENCOUNTER — Encounter (HOSPITAL_BASED_OUTPATIENT_CLINIC_OR_DEPARTMENT_OTHER): Payer: Self-pay | Admitting: *Deleted

## 2018-11-19 ENCOUNTER — Other Ambulatory Visit: Payer: Self-pay

## 2018-11-19 ENCOUNTER — Emergency Department (HOSPITAL_COMMUNITY)
Admission: EM | Admit: 2018-11-19 | Discharge: 2018-11-19 | Disposition: A | Discharge status: Home / Self Care | Payer: Self-pay | Attending: Emergency Medicine | Admitting: Emergency Medicine

## 2018-11-19 ENCOUNTER — Encounter (HOSPITAL_COMMUNITY): Payer: Self-pay | Admitting: *Deleted

## 2018-11-19 DIAGNOSIS — X501XXA Overexertion from prolonged static or awkward postures, initial encounter: Secondary | ICD-10-CM | POA: Insufficient documentation

## 2018-11-19 DIAGNOSIS — S82891A Other fracture of right lower leg, initial encounter for closed fracture: Secondary | ICD-10-CM

## 2018-11-19 DIAGNOSIS — Y998 Other external cause status: Secondary | ICD-10-CM | POA: Insufficient documentation

## 2018-11-19 DIAGNOSIS — Z91040 Latex allergy status: Secondary | ICD-10-CM | POA: Insufficient documentation

## 2018-11-19 DIAGNOSIS — S82851A Displaced trimalleolar fracture of right lower leg, initial encounter for closed fracture: Secondary | ICD-10-CM | POA: Insufficient documentation

## 2018-11-19 DIAGNOSIS — Z79899 Other long term (current) drug therapy: Secondary | ICD-10-CM | POA: Insufficient documentation

## 2018-11-19 DIAGNOSIS — Y92015 Private garage of single-family (private) house as the place of occurrence of the external cause: Secondary | ICD-10-CM | POA: Insufficient documentation

## 2018-11-19 DIAGNOSIS — Y9389 Activity, other specified: Secondary | ICD-10-CM | POA: Insufficient documentation

## 2018-11-19 DIAGNOSIS — Z9049 Acquired absence of other specified parts of digestive tract: Secondary | ICD-10-CM | POA: Insufficient documentation

## 2018-11-19 MED ORDER — HYDROMORPHONE HCL 1 MG/ML IJ SOLN: 0.5000 mg | Freq: Once | INTRAMUSCULAR | Status: DC

## 2018-11-19 MED ORDER — ETOMIDATE 2 MG/ML IV SOLN
0.1500 mg/kg | Freq: Once | INTRAVENOUS | Status: AC
  Administered 2018-11-19: 16 mg via INTRAVENOUS
  Filled 2018-11-19: qty 10

## 2018-11-19 MED ORDER — OXYCODONE HCL 5 MG PO TABS: 5.0000 mg | ORAL_TABLET | ORAL | 0 refills | Status: DC | PRN

## 2018-11-19 MED ORDER — SENNA-DOCUSATE SODIUM 8.6-50 MG PO TABS: 2.0000 | ORAL_TABLET | Freq: Every day | ORAL | 1 refills | Status: DC

## 2018-11-19 MED ORDER — HYDROMORPHONE HCL 1 MG/ML IJ SOLN
0.5000 mg | Freq: Once | INTRAMUSCULAR | Status: AC
  Administered 2018-11-19: 0.5 mg via INTRAVENOUS
  Filled 2018-11-19: qty 1

## 2018-11-19 NOTE — H&P (View-Only) (Signed)
Reason for Consult:Right ankle fx Referring Physician: [name] is an 53 y.o. female.  HPI: Dawn Mcguire was walking down her garage stairs when her foot got stuck on the step and it rolled under her. She heard it pop and had immediate pain and couldn't bear weight on it. She came to the ED where x-rays showed a fx/dislocation and orthopedic surgery was consulted. She c/o localized pain at the ankle.  Pain rated as severe, worse with movement, better with morphine.  She has a number of allergies.  Past Medical History:  Diagnosis Date  . Arthritis   . Carpal tunnel syndrome of left wrist   . GERD (gastroesophageal reflux disease)   . Psoriasis     Past Surgical History:  Procedure Laterality Date  . ABDOMINAL HYSTERECTOMY    . BLADDER REPAIR    . BREAST SURGERY    . CARPAL TUNNEL RELEASE     on the right wrist  . CHOLECYSTECTOMY    . INSERTION OF MESH N/A 10/16/2017   Procedure: INSERTION OF MESH;  Surgeon: Donnie Mesa, MD;  Location: [place];  Service: General;  Laterality: N/A;  . IR US GUIDE BX ASP/DRAIN  12/31/2017  . VENTRAL HERNIA REPAIR N/A 10/16/2017   Procedure: OPEN VENTRAL HERNIA REPAIR WITH MESH;  Surgeon: Donnie Mesa, MD;  Location: [place];  Service: General;  Laterality: N/A;    Family History  Problem Relation Age of Onset  . Arthritis Mother   . Heart attack Mother   . High blood pressure Mother   . Cancer Father   . Diabetes Father   . Arthritis Brother   . Arthritis Brother   . Arthritis Brother   . Diabetes Son     Social History:  reports that she has never smoked. She has never used smokeless tobacco. She reports that she does not drink alcohol or use drugs.  Allergies:  Allergies  Allergen Reactions  . Latex     Tears skin and break out  . Sulfa Antibiotics Itching and Nausea And Vomiting    Skin becomes red and feels like it is burning  . Sulfasalazine Hives, Itching and Nausea And Vomiting    Skin becomes red and feels like it is  burning Skin becomes red and feels like it is burning  . Zofran [Ondansetron Hcl] Nausea And Vomiting  . Corticosteroids Hives, Itching and Rash    fever  . Hydrocodone Nausea And Vomiting  . Nitrofurantoin Monohyd Macro Nausea And Vomiting  . Penicillins Hives, Itching and Rash    Has patient had a PCN reaction causing immediate rash, facial/tongue/throat swelling, SOB or lightheadedness with hypotension: No Has patient had a PCN reaction causing severe rash involving mucus membranes or skin necrosis: No Has patient had a PCN reaction that required hospitalization: No Has patient had a PCN reaction occurring within the last 10 years: No If all of the above answers are "NO", then may proceed with Cephalosporin use.   . Prednisone Hives, Itching and Other (See Comments)    fever  . Propofol Hives, Rash and Other (See Comments)    Fever and hair loss  . Tramadol Nausea Only    Medications: I have reviewed the patient's current medications.  No results found for this or any previous visit (from the past 48 hour(s)).  Dg Ankle Complete Right  Result Date: 11/19/2018 CLINICAL DATA:  Twisting injury right ankle today going up stairs. Initial encounter. EXAM: RIGHT ANKLE - COMPLETE 3+ VIEW  COMPARISON:  None. FINDINGS: The patient has an acute fracture of the distal fibula originating 8.5 cm above the tip of the lateral malleolus. Distal fracture fragment demonstrates nearly 1 shaft width lateral displacement and dorsal angulation of approximately 30 degrees. The patient also has an acute medial malleolar fracture. Marked lateral and posterior subluxation of the talus out of the tibiotalar joint is identified. No other bony abnormality is seen. Soft tissues about the ankle are swollen. IMPRESSION: Distal fibular and medial malleolar fractures with lateral and posterior subluxation of the talus out of the tibiotalar joint consistent with a Weber C injury. Electronically Signed   By: Inge Rise M.D.   On: 11/19/2018 15:41   Dg Foot Complete Right  Result Date: 11/19/2018 CLINICAL DATA:  Twisting injury right ankle today on stairs. Pain. Initial encounter. EXAM: RIGHT FOOT COMPLETE - 3+ VIEW COMPARISON:  None. FINDINGS: Distal fibular and tibial fractures are noted as seen on dedicated plain films. No other acute bony or joint abnormality. IMPRESSION: Ankle fractures as seen on dedicated plain films of the right ankle today. No other acute finding. Electronically Signed   By: Inge Rise M.D.   On: 11/19/2018 15:42    Review of Systems  Constitutional: Negative for weight loss.  HENT: Negative for ear discharge, ear pain, hearing loss and tinnitus.   Eyes: Negative for blurred vision, double vision, photophobia and pain.  Respiratory: Negative for cough, sputum production and shortness of breath.   Cardiovascular: Negative for chest pain.  Gastrointestinal: Negative for abdominal pain, nausea and vomiting.  Genitourinary: Negative for dysuria, flank pain, frequency and urgency.  Musculoskeletal: Positive for joint pain (Right ankle). Negative for back pain, falls, myalgias and neck pain.  Neurological: Negative for dizziness, tingling, sensory change, focal weakness, loss of consciousness and headaches.  Endo/Heme/Allergies: Does not bruise/bleed easily.  Psychiatric/Behavioral: Negative for depression, memory loss and substance abuse. The patient is not nervous/anxious.    Blood pressure (!) 156/120, pulse 80, temperature 99.2 F (37.3 C), temperature source Oral, resp. rate 16, height 5\' 8"  (1.727 m), weight 112.9 kg, SpO2 99 %. Physical Exam  Constitutional: She appears well-developed and well-nourished. No distress.  HENT:  Head: Normocephalic and atraumatic.  Eyes: Conjunctivae are normal. Right eye exhibits no discharge. Left eye exhibits no discharge. No scleral icterus.  Neck: Normal range of motion.  Cardiovascular: Normal rate and regular rhythm.   Respiratory: Effort normal. No respiratory distress.  Musculoskeletal:     Comments: RLE No traumatic wounds or rash  Ankle severe TTP, mild-mod edema, deformity  No knee effusion  Knee stable to varus/ valgus and anterior/posterior stress  Sens DPN, SPN, TN paresthetic  Motor EHL, ext, flex, evers grossly intact  DP 2+, PT 2+, No significant pedal edema  Neurological: She is alert.  Skin: Skin is warm and dry. She is not diaphoretic.  Psychiatric: She has a normal mood and affect. Her behavior is normal.    Assessment/Plan: Right trimalleolar ankle fracture dislocation, closed-- Will reduce and plan to ORIF tomorrow at [facility] with Dr. Mardelle Matte. NPO after MN.  The risks benefits and alternatives were discussed with the patient including but not limited to the risks of nonoperative treatment, versus surgical intervention including infection, bleeding, nerve injury, malunion, nonunion, the need for revision surgery, hardware prominence, hardware failure, the need for hardware removal, blood clots, cardiopulmonary complications, morbidity, mortality, among others, and they were willing to proceed.       Lisette Abu, PA-C Orthopedic  Surgery [phone] 11/19/2018, 3:49 PM   Patient seen and examined and agree with above.  Preprocedure diagnosis: Right ankle fracture dislocation  Postprocedure diagnosis: Same  Procedure: Right ankle closed reduction under conscious sedation performed by the emergency room  First assistant: Angelena Sole, OPA-C  Procedure details: After informed consent was obtained, conscious sedation was administered by Dr. Daleen Bo, and once satisfactory sedation achieved the right ankle was reduced and had appropriate reduction and restoration of soft tissue tension and alignment.  A well-padded posterior plaster splint was applied with a stirrup.  She tolerated this well and there were no complications.  Postreduction x-rays and CAT scan are pending,  the plan will be for definitive surgical fixation tomorrow afternoon.

## 2018-11-19 NOTE — Progress Notes (Signed)
Orthopedic Tech Progress Note Patient Details:  Dawn Mcguire [birth year] [id number]  Ortho Devices Type of Ortho Device: Post (short leg) splint, Stirrup splint Ortho Device/Splint Location: rle. assisted dr with splint application post reduction. Ortho Device/Splint Interventions: Ordered, Application, Adjustment   Post Interventions Patient Tolerated: Well Instructions Provided: Care of device, Adjustment of device   Karolee Stamps 11/19/2018, 5:22 PM

## 2018-11-19 NOTE — Telephone Encounter (Signed)
Copied from [place] [id number]. Topic: Appointment Scheduling - Scheduling Inquiry for Clinic >> Nov 19, 2018  4:20 PM Blase Mess A wrote: CRM for notification. See Telephone encounter for: 11/19/18.  Patient is calling because she is feeling better for her sinus congestion. She broke her foot -cracked it.  And patient is wanting to cancel her appt for the morning. She is feeling better. CB- [phone]

## 2018-11-19 NOTE — ED Provider Notes (Signed)
[facility] EMERGENCY DEPARTMENT Provider Note   CSN: [id number] Arrival date & time: 11/19/18  1439    History   Chief Complaint Chief Complaint  Patient presents with  . Ankle Pain    HPI Dawn Mcguire is a 53 y.o. female.     HPI   53 year old female presents today via EMS with ankle injury.  Patient notes she caught her foot in a step coming down causing a forced plantar flexion injury.  She notes obvious deformity at the ankle with inability to ambulate.  She denies any loss of sensation in the foot, she is able to wiggle her toes.  She denies any injury to the knee or the remainder of the lower extremity.  Past Medical History:  Diagnosis Date  . Arthritis   . Carpal tunnel syndrome of right wrist   . Closed displaced trimalleolar fracture of right ankle 11/20/2018  . GERD (gastroesophageal reflux disease)   . Psoriasis     Patient Active Problem List   Diagnosis Date Noted  . Closed displaced trimalleolar fracture of right ankle 11/20/2018  . Nonspecific syndrome suggestive of viral illness 11/16/2018  . Bad odor of urine 06/08/2018  . Acute non-recurrent frontal sinusitis 06/08/2018  . Suprapubic pressure 06/08/2018  . Dysfunction of both eustachian tubes 06/08/2018  . Drug allergy, multiple 06/08/2018  . Poison [facility] 01/20/2018    Past Surgical History:  Procedure Laterality Date  . ABDOMINAL HYSTERECTOMY    . BLADDER REPAIR    . BREAST SURGERY    . CARPAL TUNNEL RELEASE     on the right wrist  . CHOLECYSTECTOMY    . INSERTION OF MESH N/A 10/16/2017   Procedure: INSERTION OF MESH;  Surgeon: Donnie Mesa, MD;  Location: [place];  Service: General;  Laterality: N/A;  . IR US GUIDE BX ASP/DRAIN  12/31/2017  . ORIF ANKLE FRACTURE Right 11/20/2018   Procedure: OPEN REDUCTION INTERNAL FIXATION (ORIF) ANKLE FRACTURE AND SYNDESMOSIS;  Surgeon: Marchia Bond, MD;  Location: [place];  Service: Orthopedics;  Laterality: Right;   . VENTRAL HERNIA REPAIR N/A 10/16/2017   Procedure: OPEN VENTRAL HERNIA REPAIR WITH MESH;  Surgeon: Donnie Mesa, MD;  Location: [place];  Service: General;  Laterality: N/A;     OB History   No obstetric history on file.      Home Medications    Prior to Admission medications   Medication Sig Start Date End Date Taking? Authorizing Provider  Cyanocobalamin (VITAMIN B-12 PO) Take 1 tablet by mouth daily.   Yes [provider]  Multiple Vitamins-Minerals (MULTI ADULT GUMMIES PO) Take 1 tablet by mouth daily.   Yes [provider]  omeprazole (PRILOSEC) 20 MG capsule Take 20 mg by mouth daily.   Yes [provider]  VITAMIN D PO Take 1 tablet by mouth daily.   Yes [provider]  oxyCODONE (ROXICODONE) 5 MG immediate release tablet Take 1 tablet (5 mg total) by mouth every 4 (four) hours as needed for severe pain. 11/19/18   Marchia Bond, MD  sennosides-docusate sodium (SENOKOT-S) 8.6-50 MG tablet Take 2 tablets by mouth daily. 11/19/18   Marchia Bond, MD    Family History Family History  Problem Relation Age of Onset  . Arthritis Mother   . Heart attack Mother   . High blood pressure Mother   . Cancer Father   . Diabetes Father   . Arthritis Brother   . Arthritis Brother   . Arthritis Brother   .  Diabetes Son     Social History Social History   Tobacco Use  . Smoking status: Never Smoker  . Smokeless tobacco: Never Used  Substance Use Topics  . Alcohol use: No  . Drug use: No     Allergies   Latex; Sulfa antibiotics; Sulfasalazine; Corticosteroids; Hydrocodone; Nitrofurantoin monohyd macro; Penicillins; Prednisone; Propofol; and Tramadol   Review of Systems Review of Systems  All other systems reviewed and are negative.    Physical Exam Updated Vital Signs BP (!) 161/84 (BP Location: Right Arm)   Pulse 77   Temp 98.3 F (36.8 C) (Oral)   Resp 14   Ht 5\' 8"  (1.727 m)   Wt 112.9 kg   SpO2 98%   BMI 37.86 kg/m    Physical Exam Vitals signs and nursing note reviewed.  Constitutional:      Appearance: She is well-developed.  HENT:     Head: Normocephalic and atraumatic.  Eyes:     General: No scleral icterus.       Right eye: No discharge.        Left eye: No discharge.     Conjunctiva/sclera: Conjunctivae normal.     Pupils: Pupils are equal, round, and reactive to light.  Neck:     Musculoskeletal: Normal range of motion.     Vascular: No JVD.     Trachea: No tracheal deviation.  Pulmonary:     Effort: Pulmonary effort is normal.     Breath sounds: No stridor.  Musculoskeletal:     Comments: Obvious deformity at the right ankle versus foot displaced towards the fibula, medial malleolus nontender lateral malleolus with exquisite tenderness, tenderness with palpation of the dorsum of the foot, pedal pulse 2+ sensation intact to the foot cap refill less than 3 seconds patient is able to move her toes-no significant pain in the remainder of the lower extremity including the knee and proximal fibula.-No open wounds noted  Neurological:     Mental Status: She is alert and oriented to person, place, and time.     Coordination: Coordination normal.  Psychiatric:        Behavior: Behavior normal.        Thought Content: Thought content normal.        Judgment: Judgment normal.      ED Treatments / Results  Labs (all labs ordered are listed, but only abnormal results are displayed) Labs Reviewed - No data to display  EKG None  Radiology No results found.  Procedures Procedures (including critical care time)  Medications Ordered in ED Medications  HYDROmorphone (DILAUDID) injection 0.5 mg (0.5 mg Intravenous Given 11/19/18 1456)  HYDROmorphone (DILAUDID) injection 0.5 mg (0.5 mg Intravenous Given 11/19/18 1613)  etomidate (AMIDATE) injection 16.94 mg (16 mg Intravenous Given 11/19/18 1702)     Initial Impression / Assessment and Plan / ED Course  I have reviewed the triage vital  signs and the nursing notes.  Pertinent labs & imaging results that were available during my care of the patient were reviewed by me and considered in my medical decision making (see chart for details).  Clinical Course as of Nov 23 1131  Thu Nov 19, 2018  1836 Fracture dislocation right ankle, images reviewed by me  DG Ankle Complete Right [EW]  1836 Partial reduction, without complication, images reviewed by me  DG Ankle 2 Views Right [EW]  4008 Images confirm cardiologist, no additional injury images reviewed by me  CT ANKLE RIGHT WO CONTRAST [EW]  Clinical Course User Index [EW] Daleen Bo, MD       Labs:   Imaging: DG ankle right, DG ankle foot  Consults:  Therapeutics: Dilaudid  Discharge Meds:   Assessment/Plan: She here with fracture dislocation of the ankle.  Orthopedics consulted who will be evaluating patient at bedside.  Care soon by attending physician pending orthopedic recommendation.  Patient's foot is warm and well perfused with no signs of vascular or neurologic damage.   Final Clinical Impressions(s) / ED Diagnoses   Final diagnoses:  Closed fracture dislocation of right ankle, initial encounter    ED Discharge Orders         Ordered    oxyCODONE (ROXICODONE) 5 MG immediate release tablet  Every 4 hours PRN     11/19/18 1725    sennosides-docusate sodium (SENOKOT-S) 8.6-50 MG tablet  Daily     11/19/18 1725           Okey Regal, PA-C 11/23/18 1133    Daleen Bo, MD 11/26/18 1020

## 2018-11-19 NOTE — ED Provider Notes (Signed)
Face-to-face evaluation   History: She is here for evaluation of isolated injury to her right ankle when she "rolled it."  She was going down some steps when the injury occurred.  Denies head injury, neck pain or back pain.  Physical exam: Obese, alert and cooperative.  Right ankle is deformed.  There is no skin break.  Neurovascular intact distally in the right toes.  Orthopedics here to reduce ankle fracture dislocation, asked me to sedate the patient.  .Sedation Date/Time: 11/19/2018 5:00 PM Performed by: Daleen Bo, MD Authorized by: Daleen Bo, MD   Consent:    Consent obtained:  Written   Consent given by:  Patient   Risks discussed:  Inadequate sedation   Alternatives discussed:  Analgesia without sedation Universal protocol:    Immediately prior to procedure a time out was called: yes   Indications:    Procedure performed:  Dislocation reduction   Procedure necessitating sedation performed by:  Different physician Pre-sedation assessment:    Time since last food or drink:  8 hours   ASA classification: class 2 - patient with mild systemic disease     Neck mobility: normal     Mouth opening:  3 or more finger widths   Mallampati score:  I - soft palate, uvula, fauces, pillars visible   Pre-sedation assessments completed and reviewed: airway patency, cardiovascular function, hydration status, mental status and respiratory function     Pre-sedation assessments completed and reviewed: nausea/vomiting not reviewed and pain level not reviewed   Immediate pre-procedure details:    Reassessment: Patient reassessed immediately prior to procedure     Reviewed: vital signs and NPO status     Verified: bag valve mask available, emergency equipment available, intubation equipment available, IV patency confirmed, oxygen available and suction available   Procedure details (see MAR for exact dosages):    Preoxygenation:  Nasal cannula   Sedation:  Etomidate   Analgesia:   Hydromorphone   Intra-procedure monitoring:  Blood pressure monitoring, cardiac monitor, continuous capnometry, continuous pulse oximetry, frequent LOC assessments and frequent vital sign checks   Total Provider sedation time (minutes):  15 Post-procedure details:    Post-sedation assessment completed:  11/19/2018 5:15 PM   Attendance: Constant attendance by certified staff until patient recovered     Recovery: Patient returned to pre-procedure baseline     Post-sedation assessments completed and reviewed: airway patency, cardiovascular function, hydration status, mental status and respiratory function     Post-sedation assessments completed and reviewed: nausea/vomiting not reviewed and pain level not reviewed     Patient is stable for discharge or admission: yes     Patient tolerance:  Tolerated well, no immediate complications      Patient Vitals for the past 24 hrs:  BP Temp Temp src Pulse Resp SpO2 Height Weight  11/19/18 1710 (!) 153/89 - - 74 17 100 % - -  11/19/18 1708 - 98.3 F (36.8 C) Oral - - - - -  11/19/18 1706 (!) 167/76 - - 82 15 100 % - -  11/19/18 1704 (!) 173/80 - - 81 (!) 24 100 % - -  11/19/18 1700 (!) 170/107 - - (!) 105 19 100 % - -  11/19/18 1654 (!) 162/99 98.6 F (37 C) Oral 85 18 98 % - -  11/19/18 1448 (!) 156/120 99.2 F (37.3 C) Oral 80 16 99 % 5\' 8"  (1.727 m) 112.9 kg   Clinical Course as of Nov 18 1836  Thu Nov 19, 2018  1836 Fracture dislocation right ankle, images reviewed by me  DG Ankle Complete Right [EW]  1836 Partial reduction, without complication, images reviewed by me  DG Ankle 2 Views Right [EW]  9166 Images confirm cardiologist, no additional injury images reviewed by me  CT ANKLE RIGHT WO CONTRAST [EW]    Clinical Course User Index [EW] Daleen Bo, MD    6:34 PM Reevaluation with update and discussion. After initial assessment and treatment, an updated evaluation reveals she is comfortable now and has no further complaints.   She states she has crutches at home, and oxycodone to use at home.  All questions answered. Daleen Bo   Medical Decision Making: Injury, right ankle, isolated.  Fracture dislocation reduced by orthopedics, with reasonable reduction.  Postreduction imaging shows appropriate reduction and CT confirms no additional unsuspected injuries.  No indication for further ED treatment or intervention at this time.  CRITICAL CARE-no Performed by: Daleen Bo  Nursing Notes Reviewed/ Care Coordinated Applicable Imaging Reviewed Interpretation of Laboratory Data incorporated into ED treatment  The patient appears reasonably screened and/or stabilized for discharge and I doubt any other medical condition or other [facility] requiring further screening, evaluation, or treatment in the ED at this time prior to discharge.  Plan: Home Medications-continue usual; Home Treatments-elevate right leg; return here if the recommended treatment, does not improve the symptoms; Recommended follow up-follow-up with surgery, tomorrow as directed by orthopedics.   Medical screening examination/treatment/procedure(s) were conducted as a shared visit with non-physician practitioner(s) and myself.  I personally evaluated the patient during the encounter    Daleen Bo, MD 11/19/18 Bosie Helper

## 2018-11-19 NOTE — Consult Note (Addendum)
Reason for Consult:Right ankle fx Referring Physician: E Dystany Duffy is an 53 y.o. female.  HPI: Kamia was walking down her garage stairs when her foot got stuck on the step and it rolled under her. She heard it pop and had immediate pain and couldn't bear weight on it. She came to the ED where x-rays showed a fx/dislocation and orthopedic surgery was consulted. She c/o localized pain at the ankle.  Pain rated as severe, worse with movement, better with morphine.  She has a number of allergies.  Past Medical History:  Diagnosis Date  . Arthritis   . Carpal tunnel syndrome of left wrist   . GERD (gastroesophageal reflux disease)   . Psoriasis     Past Surgical History:  Procedure Laterality Date  . ABDOMINAL HYSTERECTOMY    . BLADDER REPAIR    . BREAST SURGERY    . CARPAL TUNNEL RELEASE     on the right wrist  . CHOLECYSTECTOMY    . INSERTION OF MESH N/A 10/16/2017   Procedure: INSERTION OF MESH;  Surgeon: Donnie Mesa, MD;  Location: [place];  Service: General;  Laterality: N/A;  . IR US GUIDE BX ASP/DRAIN  12/31/2017  . VENTRAL HERNIA REPAIR N/A 10/16/2017   Procedure: OPEN VENTRAL HERNIA REPAIR WITH MESH;  Surgeon: Donnie Mesa, MD;  Location: [place];  Service: General;  Laterality: N/A;    Family History  Problem Relation Age of Onset  . Arthritis Mother   . Heart attack Mother   . High blood pressure Mother   . Cancer Father   . Diabetes Father   . Arthritis Brother   . Arthritis Brother   . Arthritis Brother   . Diabetes Son     Social History:  reports that she has never smoked. She has never used smokeless tobacco. She reports that she does not drink alcohol or use drugs.  Allergies:  Allergies  Allergen Reactions  . Latex     Tears skin and break out  . Sulfa Antibiotics Itching and Nausea And Vomiting    Skin becomes red and feels like it is burning  . Sulfasalazine Hives, Itching and Nausea And Vomiting    Skin becomes red and feels like it is  burning Skin becomes red and feels like it is burning  . Zofran [Ondansetron Hcl] Nausea And Vomiting  . Corticosteroids Hives, Itching and Rash    fever  . Hydrocodone Nausea And Vomiting  . Nitrofurantoin Monohyd Macro Nausea And Vomiting  . Penicillins Hives, Itching and Rash    Has patient had a PCN reaction causing immediate rash, facial/tongue/throat swelling, SOB or lightheadedness with hypotension: No Has patient had a PCN reaction causing severe rash involving mucus membranes or skin necrosis: No Has patient had a PCN reaction that required hospitalization: No Has patient had a PCN reaction occurring within the last 10 years: No If all of the above answers are "NO", then may proceed with Cephalosporin use.   . Prednisone Hives, Itching and Other (See Comments)    fever  . Propofol Hives, Rash and Other (See Comments)    Fever and hair loss  . Tramadol Nausea Only    Medications: I have reviewed the patient's current medications.  No results found for this or any previous visit (from the past 48 hour(s)).  Dg Ankle Complete Right  Result Date: 11/19/2018 CLINICAL DATA:  Twisting injury right ankle today going up stairs. Initial encounter. EXAM: RIGHT ANKLE - COMPLETE 3+ VIEW  COMPARISON:  None. FINDINGS: The patient has an acute fracture of the distal fibula originating 8.5 cm above the tip of the lateral malleolus. Distal fracture fragment demonstrates nearly 1 shaft width lateral displacement and dorsal angulation of approximately 30 degrees. The patient also has an acute medial malleolar fracture. Marked lateral and posterior subluxation of the talus out of the tibiotalar joint is identified. No other bony abnormality is seen. Soft tissues about the ankle are swollen. IMPRESSION: Distal fibular and medial malleolar fractures with lateral and posterior subluxation of the talus out of the tibiotalar joint consistent with a Weber C injury. Electronically Signed   By: Inge Rise M.D.   On: 11/19/2018 15:41   Dg Foot Complete Right  Result Date: 11/19/2018 CLINICAL DATA:  Twisting injury right ankle today on stairs. Pain. Initial encounter. EXAM: RIGHT FOOT COMPLETE - 3+ VIEW COMPARISON:  None. FINDINGS: Distal fibular and tibial fractures are noted as seen on dedicated plain films. No other acute bony or joint abnormality. IMPRESSION: Ankle fractures as seen on dedicated plain films of the right ankle today. No other acute finding. Electronically Signed   By: Inge Rise M.D.   On: 11/19/2018 15:42    Review of Systems  Constitutional: Negative for weight loss.  HENT: Negative for ear discharge, ear pain, hearing loss and tinnitus.   Eyes: Negative for blurred vision, double vision, photophobia and pain.  Respiratory: Negative for cough, sputum production and shortness of breath.   Cardiovascular: Negative for chest pain.  Gastrointestinal: Negative for abdominal pain, nausea and vomiting.  Genitourinary: Negative for dysuria, flank pain, frequency and urgency.  Musculoskeletal: Positive for joint pain (Right ankle). Negative for back pain, falls, myalgias and neck pain.  Neurological: Negative for dizziness, tingling, sensory change, focal weakness, loss of consciousness and headaches.  Endo/Heme/Allergies: Does not bruise/bleed easily.  Psychiatric/Behavioral: Negative for depression, memory loss and substance abuse. The patient is not nervous/anxious.    Blood pressure (!) 156/120, pulse 80, temperature 99.2 F (37.3 C), temperature source Oral, resp. rate 16, height 5\' 8"  (1.727 m), weight 112.9 kg, SpO2 99 %. Physical Exam  Constitutional: She appears well-developed and well-nourished. No distress.  HENT:  Head: Normocephalic and atraumatic.  Eyes: Conjunctivae are normal. Right eye exhibits no discharge. Left eye exhibits no discharge. No scleral icterus.  Neck: Normal range of motion.  Cardiovascular: Normal rate and regular rhythm.   Respiratory: Effort normal. No respiratory distress.  Musculoskeletal:     Comments: RLE No traumatic wounds or rash  Ankle severe TTP, mild-mod edema, deformity  No knee effusion  Knee stable to varus/ valgus and anterior/posterior stress  Sens DPN, SPN, TN paresthetic  Motor EHL, ext, flex, evers grossly intact  DP 2+, PT 2+, No significant pedal edema  Neurological: She is alert.  Skin: Skin is warm and dry. She is not diaphoretic.  Psychiatric: She has a normal mood and affect. Her behavior is normal.    Assessment/Plan: Right trimalleolar ankle fracture dislocation, closed-- Will reduce and plan to ORIF tomorrow at [facility] with Dr. Mardelle Matte. NPO after MN.  The risks benefits and alternatives were discussed with the patient including but not limited to the risks of nonoperative treatment, versus surgical intervention including infection, bleeding, nerve injury, malunion, nonunion, the need for revision surgery, hardware prominence, hardware failure, the need for hardware removal, blood clots, cardiopulmonary complications, morbidity, mortality, among others, and they were willing to proceed.       Lisette Abu, PA-C Orthopedic  Surgery [phone] 11/19/2018, 3:49 PM   Patient seen and examined and agree with above.  Preprocedure diagnosis: Right ankle fracture dislocation  Postprocedure diagnosis: Same  Procedure: Right ankle closed reduction under conscious sedation performed by the emergency room  First assistant: Angelena Sole, OPA-C  Procedure details: After informed consent was obtained, conscious sedation was administered by Dr. Daleen Bo, and once satisfactory sedation achieved the right ankle was reduced and had appropriate reduction and restoration of soft tissue tension and alignment.  A well-padded posterior plaster splint was applied with a stirrup.  She tolerated this well and there were no complications.  Postreduction x-rays and CAT scan are pending,  the plan will be for definitive surgical fixation tomorrow afternoon.

## 2018-11-19 NOTE — ED Triage Notes (Signed)
PT fell when  Going out to [place]  Today.Pt has deformty to RT ankle.

## 2018-11-19 NOTE — Progress Notes (Deleted)
Virtual Visit via Video Note  I connected with Dawn Mcguire on 11/19/18 at  9:30 AM EDT by a video enabled telemedicine application and verified that I am speaking with the correct person using two identifiers.   I discussed the limitations of evaluation and management by telemedicine and the availability of in person appointments. The patient expressed understanding and agreed to proceed.  History of Present Illness:    Observations/Objective:   Assessment and Plan:   Follow Up Instructions:    I discussed the assessment and treatment plan with the patient. The patient was provided an opportunity to ask questions and all were answered. The patient agreed with the plan and demonstrated an understanding of the instructions.   The patient was advised to call back or seek an in-person evaluation if the symptoms worsen or if the condition fails to improve as anticipated.  I provided *** minutes of non-face-to-face time during this encounter.

## 2018-11-19 NOTE — Discharge Instructions (Addendum)
Diet: As you were doing prior to hospitalization   Shower:  May shower but keep the wounds dry, use an occlusive plastic wrap, NO SOAKING IN TUB.  If the bandage gets wet, change with a clean dry gauze.  If you have a splint on, leave the splint in place and keep the splint dry with a plastic bag.  Dressing:  You may change your dressing 3-5 days after surgery, unless you have a splint.  If you have a splint, then just leave the splint in place and we will change your bandages during your first follow-up appointment.    If you had hand or foot surgery, we will plan to remove your stitches in about 2 weeks in the office.  For all other surgeries, there are sticky tapes (steri-strips) on your wounds and all the stitches are absorbable.  Leave the steri-strips in place when changing your dressings, they will peel off with time, usually 2-3 weeks.  Activity:  Increase activity slowly as tolerated, but follow the weight bearing instructions below.  The rules on driving is that you can not be taking narcotics while you drive, and you must feel in control of the vehicle.    Weight Bearing:   No weight on left leg, KEEP ELEVATED AT ALL TIMES.    To prevent constipation: you may use a stool softener such as -  Colace (over the counter) 100 mg by mouth twice a day  Drink plenty of fluids (prune juice may be helpful) and high fiber foods Miralax (over the counter) for constipation as needed.    Itching:  If you experience itching with your medications, try taking only a single pain pill, or even half a pain pill at a time.  You may take up to 10 pain pills per day, and you can also use benadryl over the counter for itching or also to help with sleep.   Precautions:  If you experience chest pain or shortness of breath - call 911 immediately for transfer to the hospital emergency department!!  If you develop a fever greater that 101 F, purulent drainage from wound, increased redness or drainage from wound,  or calf pain -- Call the office at [phone]                                                Follow- Up Appointment:  Please call for an appointment to be seen in 2 weeks [place] - [phone]

## 2018-11-19 NOTE — ED Notes (Signed)
Patient verbalizes understanding of discharge instructions. Opportunity for questioning and answering were provided. , patient discharged from ED.

## 2018-11-20 ENCOUNTER — Ambulatory Visit (HOSPITAL_BASED_OUTPATIENT_CLINIC_OR_DEPARTMENT_OTHER)
Admission: RE | Admit: 2018-11-20 | Discharge: 2018-11-20 | Disposition: A | Discharge status: Home / Self Care | Payer: Self-pay | Attending: Orthopedic Surgery | Admitting: Orthopedic Surgery

## 2018-11-20 ENCOUNTER — Ambulatory Visit: Payer: Self-pay | Admitting: Family Medicine

## 2018-11-20 ENCOUNTER — Encounter (HOSPITAL_BASED_OUTPATIENT_CLINIC_OR_DEPARTMENT_OTHER): Payer: Self-pay

## 2018-11-20 ENCOUNTER — Ambulatory Visit (HOSPITAL_BASED_OUTPATIENT_CLINIC_OR_DEPARTMENT_OTHER): Payer: Self-pay | Admitting: Anesthesiology

## 2018-11-20 ENCOUNTER — Encounter (HOSPITAL_BASED_OUTPATIENT_CLINIC_OR_DEPARTMENT_OTHER)
Admission: RE | Disposition: A | Discharge status: Home / Self Care | Payer: Self-pay | Source: Home / Self Care | Attending: Orthopedic Surgery

## 2018-11-20 DIAGNOSIS — X501XXA Overexertion from prolonged static or awkward postures, initial encounter: Secondary | ICD-10-CM | POA: Insufficient documentation

## 2018-11-20 DIAGNOSIS — S93431A Sprain of tibiofibular ligament of right ankle, initial encounter: Secondary | ICD-10-CM | POA: Insufficient documentation

## 2018-11-20 DIAGNOSIS — M1990 Unspecified osteoarthritis, unspecified site: Secondary | ICD-10-CM | POA: Insufficient documentation

## 2018-11-20 DIAGNOSIS — S82851A Displaced trimalleolar fracture of right lower leg, initial encounter for closed fracture: Secondary | ICD-10-CM

## 2018-11-20 DIAGNOSIS — Z79899 Other long term (current) drug therapy: Secondary | ICD-10-CM | POA: Insufficient documentation

## 2018-11-20 DIAGNOSIS — K219 Gastro-esophageal reflux disease without esophagitis: Secondary | ICD-10-CM | POA: Insufficient documentation

## 2018-11-20 HISTORY — DX: Carpal tunnel syndrome, right upper limb: G56.01

## 2018-11-20 HISTORY — PX: ORIF ANKLE FRACTURE: SHX5408

## 2018-11-20 HISTORY — DX: Displaced trimalleolar fracture of right lower leg, initial encounter for closed fracture: S82.851A

## 2018-11-20 SURGERY — OPEN REDUCTION INTERNAL FIXATION (ORIF) ANKLE FRACTURE
Anesthesia: Regional | Site: Ankle | Laterality: Right

## 2018-11-20 MED ORDER — GABAPENTIN 300 MG PO CAPS
ORAL_CAPSULE | ORAL | Status: AC
  Filled 2018-11-20: qty 1

## 2018-11-20 MED ORDER — LIDOCAINE HCL (CARDIAC) PF 100 MG/5ML IV SOSY
PREFILLED_SYRINGE | INTRAVENOUS | Status: DC | PRN
  Administered 2018-11-20: 100 mg via INTRAVENOUS

## 2018-11-20 MED ORDER — GABAPENTIN 300 MG PO CAPS
300.0000 mg | ORAL_CAPSULE | Freq: Once | ORAL | Status: AC
  Administered 2018-11-20: 13:00:00 300 mg via ORAL

## 2018-11-20 MED ORDER — CEFAZOLIN SODIUM-DEXTROSE 2-4 GM/100ML-% IV SOLN
INTRAVENOUS | Status: AC
  Filled 2018-11-20: qty 100

## 2018-11-20 MED ORDER — SCOPOLAMINE 1 MG/3DAYS TD PT72
MEDICATED_PATCH | TRANSDERMAL | Status: AC
  Filled 2018-11-20: qty 1

## 2018-11-20 MED ORDER — FENTANYL CITRATE (PF) 100 MCG/2ML IJ SOLN
50.0000 ug | INTRAMUSCULAR | Status: AC | PRN
  Administered 2018-11-20: 50 ug via INTRAVENOUS
  Administered 2018-11-20 (×2): 25 ug via INTRAVENOUS
  Administered 2018-11-20: 50 ug via INTRAVENOUS

## 2018-11-20 MED ORDER — PROPOFOL 500 MG/50ML IV EMUL
INTRAVENOUS | Status: DC | PRN
  Administered 2018-11-20: 300 ug/kg/min via INTRAVENOUS

## 2018-11-20 MED ORDER — LACTATED RINGERS IV SOLN
INTRAVENOUS | Status: DC
  Administered 2018-11-20: 10 mL/h via INTRAVENOUS
  Administered 2018-11-20: 17:00:00 via INTRAVENOUS

## 2018-11-20 MED ORDER — FENTANYL CITRATE (PF) 100 MCG/2ML IJ SOLN: 25.0000 ug | INTRAMUSCULAR | Status: DC | PRN

## 2018-11-20 MED ORDER — BUPIVACAINE-EPINEPHRINE (PF) 0.5% -1:200000 IJ SOLN
INTRAMUSCULAR | Status: DC | PRN
  Administered 2018-11-20: 30 mL via PERINEURAL
  Administered 2018-11-20: 15 mL via PERINEURAL

## 2018-11-20 MED ORDER — CLONIDINE HCL (ANALGESIA) 100 MCG/ML EP SOLN
EPIDURAL | Status: DC | PRN
  Administered 2018-11-20 (×2): 50 ug

## 2018-11-20 MED ORDER — ONDANSETRON HCL 4 MG/2ML IJ SOLN
INTRAMUSCULAR | Status: DC | PRN
  Administered 2018-11-20: 4 mg via INTRAVENOUS

## 2018-11-20 MED ORDER — CEFAZOLIN SODIUM-DEXTROSE 2-4 GM/100ML-% IV SOLN: 2.0000 g | INTRAVENOUS | Status: DC

## 2018-11-20 MED ORDER — ACETAMINOPHEN 500 MG PO TABS: 1000.0000 mg | ORAL_TABLET | Freq: Once | ORAL | Status: AC

## 2018-11-20 MED ORDER — SCOPOLAMINE 1 MG/3DAYS TD PT72
1.0000 | MEDICATED_PATCH | Freq: Once | TRANSDERMAL | Status: DC | PRN
  Administered 2018-11-20: 1.5 mg via TRANSDERMAL

## 2018-11-20 MED ORDER — FENTANYL CITRATE (PF) 100 MCG/2ML IJ SOLN
INTRAMUSCULAR | Status: AC
  Filled 2018-11-20: qty 2

## 2018-11-20 MED ORDER — ACETAMINOPHEN 500 MG PO TABS
1000.0000 mg | ORAL_TABLET | Freq: Once | ORAL | Status: AC
  Administered 2018-11-20: 1000 mg via ORAL

## 2018-11-20 MED ORDER — MIDAZOLAM HCL 2 MG/2ML IJ SOLN
INTRAMUSCULAR | Status: AC
  Filled 2018-11-20: qty 2

## 2018-11-20 MED ORDER — DEXAMETHASONE SODIUM PHOSPHATE 10 MG/ML IJ SOLN
INTRAMUSCULAR | Status: DC | PRN
  Administered 2018-11-20: 10 mg via INTRAVENOUS

## 2018-11-20 MED ORDER — CHLORHEXIDINE GLUCONATE 4 % EX LIQD: 60.0000 mL | Freq: Once | CUTANEOUS | Status: DC

## 2018-11-20 MED ORDER — MIDAZOLAM HCL 2 MG/2ML IJ SOLN
1.0000 mg | INTRAMUSCULAR | Status: DC | PRN
  Administered 2018-11-20 (×2): 2 mg via INTRAVENOUS

## 2018-11-20 MED ORDER — ACETAMINOPHEN 500 MG PO TABS
ORAL_TABLET | ORAL | Status: AC
  Filled 2018-11-20: qty 2

## 2018-11-20 MED ORDER — PROPOFOL 10 MG/ML IV BOLUS
INTRAVENOUS | Status: DC | PRN
  Administered 2018-11-20: 100 mg via INTRAVENOUS
  Administered 2018-11-20: 300 mg via INTRAVENOUS
  Administered 2018-11-20: 100 mg via INTRAVENOUS

## 2018-11-20 SURGICAL SUPPLY — 86 items
BANDAGE ACE 4X5 VEL STRL LF (GAUZE/BANDAGES/DRESSINGS) ×2 IMPLANT
BANDAGE ACE 6X5 VEL STRL LF (GAUZE/BANDAGES/DRESSINGS) ×2 IMPLANT
BANDAGE ESMARK 6X9 LF (GAUZE/BANDAGES/DRESSINGS) ×1 IMPLANT
BIT DRILL 110X2.5XQCK CNCT (BIT) IMPLANT
BIT DRILL 2.5 (BIT) ×1
BIT DRILL 2.9 CANN QC NONSTRL (BIT) ×1 IMPLANT
BIT DRL 110X2.5XQCK CNCT (BIT) ×1
BLADE SURG 15 STRL LF DISP TIS (BLADE) ×3 IMPLANT
BLADE SURG 15 STRL SS (BLADE) ×3
BNDG COHESIVE 4X5 TAN STRL (GAUZE/BANDAGES/DRESSINGS) ×2 IMPLANT
BNDG ESMARK 6X9 LF (GAUZE/BANDAGES/DRESSINGS) ×2
CANISTER SUCT 1200ML W/VALVE (MISCELLANEOUS) ×2 IMPLANT
CLSR STERI-STRIP ANTIMIC 1/2X4 (GAUZE/BANDAGES/DRESSINGS) IMPLANT
COVER BACK TABLE REUSABLE LG (DRAPES) ×2 IMPLANT
COVER WAND RF STERILE (DRAPES) IMPLANT
CUFF TOURN SGL QUICK 34 (TOURNIQUET CUFF)
CUFF TOURN SGL QUICK 42 (TOURNIQUET CUFF) ×1 IMPLANT
CUFF TRNQT CYL 34X4.125X (TOURNIQUET CUFF) IMPLANT
DECANTER SPIKE VIAL GLASS SM (MISCELLANEOUS) IMPLANT
DRAPE C-ARM 42X72 X-RAY (DRAPES) IMPLANT
DRAPE C-ARMOR (DRAPES) IMPLANT
DRAPE EXTREMITY T 121X128X90 (DISPOSABLE) ×2 IMPLANT
DRAPE IMP U-DRAPE 54X76 (DRAPES) ×2 IMPLANT
DRAPE INCISE IOBAN 66X45 STRL (DRAPES) ×1 IMPLANT
DRAPE OEC MINIVIEW 54X84 (DRAPES) ×1 IMPLANT
DRAPE U-SHAPE 47X51 STRL (DRAPES) ×2 IMPLANT
DRSG ADAPTIC 3X8 NADH LF (GAUZE/BANDAGES/DRESSINGS) IMPLANT
DRSG PAD ABDOMINAL 8X10 ST (GAUZE/BANDAGES/DRESSINGS) ×4 IMPLANT
DURAPREP 26ML APPLICATOR (WOUND CARE) ×1 IMPLANT
ELECT REM PT RETURN 9FT ADLT (ELECTROSURGICAL) ×2
ELECTRODE REM PT RTRN 9FT ADLT (ELECTROSURGICAL) ×1 IMPLANT
GAUZE SPONGE 4X4 12PLY STRL (GAUZE/BANDAGES/DRESSINGS) ×2 IMPLANT
GLOVE BIO SURGEON STRL SZ8 (GLOVE) ×1 IMPLANT
GLOVE BIOGEL PI IND STRL 7.0 (GLOVE) IMPLANT
GLOVE BIOGEL PI IND STRL 8 (GLOVE) ×2 IMPLANT
GLOVE BIOGEL PI INDICATOR 7.0 (GLOVE) ×1
GLOVE BIOGEL PI INDICATOR 8 (GLOVE) ×2
GLOVE ORTHO TXT STRL SZ7.5 (GLOVE) ×1 IMPLANT
GLOVE SURG SS PI 6.5 STRL IVOR (GLOVE) ×1 IMPLANT
GLOVE SURG SS PI 7.5 STRL IVOR (GLOVE) ×1 IMPLANT
GLOVE SURG SS PI 8.0 STRL IVOR (GLOVE) ×1 IMPLANT
GOWN STRL REUS W/ TWL LRG LVL3 (GOWN DISPOSABLE) ×1 IMPLANT
GOWN STRL REUS W/ TWL XL LVL3 (GOWN DISPOSABLE) ×2 IMPLANT
GOWN STRL REUS W/TWL LRG LVL3 (GOWN DISPOSABLE) ×1
GOWN STRL REUS W/TWL XL LVL3 (GOWN DISPOSABLE) ×2
K-WIRE ACE 1.6X6 (WIRE) ×4
KWIRE ACE 1.6X6 (WIRE) IMPLANT
NDL HYPO 25X1 1.5 SAFETY (NEEDLE) IMPLANT
NEEDLE HYPO 25X1 1.5 SAFETY (NEEDLE) IMPLANT
NS IRRIG 1000ML POUR BTL (IV SOLUTION) ×2 IMPLANT
PACK BASIN DAY SURGERY FS (CUSTOM PROCEDURE TRAY) ×2 IMPLANT
PAD CAST 4YDX4 CTTN HI CHSV (CAST SUPPLIES) ×2 IMPLANT
PADDING CAST COTTON 4X4 STRL (CAST SUPPLIES) ×2
PENCIL BUTTON HOLSTER BLD 10FT (ELECTRODE) ×2 IMPLANT
PLATE 7HOLE 1/3 TUBULAR (Plate) ×1 IMPLANT
SCREW CANCELLOUS 4.0X18 (Screw) ×1 IMPLANT
SCREW CANCELLOUS FT 4.0X14 (Screw) ×1 IMPLANT
SCREW CANN 1/3 THRD RVRS CT (Screw) IMPLANT
SCREW CANNULATED 4.0X40 (Screw) ×1 IMPLANT
SCREW CORTICAL 3.5 16MM (Screw) ×1 IMPLANT
SCREW CORTICAL 3.5X14 (Screw) ×2 IMPLANT
SCREW CORTICAL NONLOCK 3.5X55 (Screw) ×1 IMPLANT
SHEET MEDIUM DRAPE 40X70 STRL (DRAPES) ×1 IMPLANT
SLEEVE SCD COMPRESS KNEE MED (MISCELLANEOUS) ×2 IMPLANT
SPLINT FAST PLASTER 5X30 (CAST SUPPLIES)
SPLINT PLASTER CAST FAST 5X30 (CAST SUPPLIES) IMPLANT
SPONGE LAP 4X18 RFD (DISPOSABLE) ×3 IMPLANT
STAPLER VISISTAT 35W (STAPLE) IMPLANT
SUCTION FRAZIER HANDLE 10FR (MISCELLANEOUS) ×1
SUCTION TUBE FRAZIER 10FR DISP (MISCELLANEOUS) ×1 IMPLANT
SUT ETHILON 3 0 PS 1 (SUTURE) IMPLANT
SUT ETHILON 4 0 PS 2 18 (SUTURE) IMPLANT
SUT MNCRL AB 4-0 PS2 18 (SUTURE) IMPLANT
SUT VIC AB 0 CT1 27 (SUTURE)
SUT VIC AB 0 CT1 27XBRD ANBCTR (SUTURE) IMPLANT
SUT VIC AB 2-0 SH 18 (SUTURE) IMPLANT
SUT VIC AB 3-0 SH 27 (SUTURE)
SUT VIC AB 3-0 SH 27X BRD (SUTURE) IMPLANT
SUT VICRYL 3-0 CR8 SH (SUTURE) ×1 IMPLANT
SYR BULB 3OZ (MISCELLANEOUS) ×2 IMPLANT
SYR CONTROL 10ML LL (SYRINGE) IMPLANT
TOWEL OR 17X24 6PK STRL BLUE (TOWEL DISPOSABLE) ×2 IMPLANT
TUBE CONNECTING 20X1/4 (TUBING) ×2 IMPLANT
UNDERPAD 30X30 (UNDERPADS AND DIAPERS) ×2 IMPLANT
WASHER SM (Washer) ×1 IMPLANT
YANKAUER SUCT BULB TIP NO VENT (SUCTIONS) ×2 IMPLANT

## 2018-11-20 NOTE — Anesthesia Procedure Notes (Signed)
Procedure Name: LMA Insertion Performed by: Verita Lamb, CRNA Pre-anesthesia Checklist: Patient identified, Emergency Drugs available, Suction available, Patient being monitored and Timeout performed Patient Re-evaluated:Patient Re-evaluated prior to induction Oxygen Delivery Method: Circle system utilized Preoxygenation: Pre-oxygenation with 100% oxygen Induction Type: IV induction LMA: LMA inserted LMA Size: 4.0 Grade View: Grade I Tube type: Oral Placement Confirmation: positive ETCO2,  CO2 detector and breath sounds checked- equal and bilateral Tube secured with: Tape Dental Injury: Teeth and Oropharynx as per pre-operative assessment

## 2018-11-20 NOTE — Transfer of Care (Signed)
Immediate Anesthesia Transfer of Care Note  Patient: Dawn Mcguire  Procedure(s) Performed: OPEN REDUCTION INTERNAL FIXATION (ORIF) ANKLE FRACTURE AND SYNDESMOSIS (Right Ankle)  Patient Location: PACU  Anesthesia Type:General  Level of Consciousness: awake, alert  and oriented  Airway & Oxygen Therapy: Patient Spontanous Breathing and Patient connected to face mask oxygen  Post-op Assessment: Report given to RN and Post -op Vital signs reviewed and stable  Post vital signs: Reviewed and stable  Last Vitals:  Vitals Value Taken Time  BP 96/61 11/20/2018  5:45 PM  Temp 36.4 C 11/20/2018  5:45 PM  Pulse 83 11/20/2018  5:45 PM  Resp 15 11/20/2018  5:45 PM  SpO2 96 % 11/20/2018  5:45 PM  Vitals shown include unvalidated device data.  Last Pain:  Vitals:   11/20/18 1305  TempSrc: Oral  PainSc: 0-No pain      Patients Stated Pain Goal: 5 (09/81/19 1478)  Complications: No apparent anesthesia complications

## 2018-11-20 NOTE — Op Note (Signed)
11/20/2018  PATIENT:  Dawn Mcguire    PRE-OPERATIVE DIAGNOSIS: Right trimalleolar ankle fracture with syndesmotic disruption  POST-OPERATIVE DIAGNOSIS:  Same  PROCEDURE:    1.  Right ankle trimalleolar fracture open reduction internal fixation without fixation of the posterior lip 2.  Open reduction internal fixation right syndesmosis 3.  3 views plus a stress view of the right ankle were taken intraoperatively that demonstrated slight syndesmotic widening after fibular fixation, and postoperatively there was anatomic reconstruction with open reduction internal fixation.  SURGEON:  Johnny Bridge, MD  PHYSICIAN ASSISTANT: Joya Gaskins, OPA-C, present and scrubbed throughout the case, critical for completion in a timely fashion, and for retraction, instrumentation, and closure.  ANESTHESIA:   General with abductor canal and popliteal block  ESTIMATED BLOOD LOSS: 50 mL  PREOPERATIVE INDICATIONS:  Dawn Mcguire is a  53 y.o. female with a diagnosis of RIGHT ANKLE FRACTURE who elected for surgical management to minimize the risk for malunion and nonunion and post-traumatic arthritis.  She dislocated her ankle yesterday, and had a close reduced in the emergency room, and then was brought electively for surgical intervention today.  The risks benefits and alternatives were discussed with the patient preoperatively including but not limited to the risks of infection, bleeding, nerve injury, cardiopulmonary complications, the need for revision surgery, the need for hardware removal, among others, and the patient was willing to proceed.  OPERATIVE IMPLANTS: 1/3 tubular plate, with 2 distal cancellus screws, 3 proximal cortical screws, 1 tricortical syndesmotic screw that also provided fixation into the distal segment.  I used a single 4.0 mm cannulated screw for the medial side with a washer.  OPERATIVE PROCEDURE: The patient was brought to the operating room and placed in the supine  position. All bony prominences were padded. General anesthesia was administered. The lower extremity was prepped and draped in the usual sterile fashion. The leg was elevated and exsanguinated and the tourniquet was inflated. Time out was performed.   Incision was made over the distal fibula and the fracture was exposed and reduced anatomically with a clamp.  A lag screw was not possible because of significant comminution posteriorly, there was a large posterior medial segment which keyed in reasonably well with the distal segment, I held the distal segment to the proximal segment with a medial lateral clamp, which was somewhat unusual, but I was able to fit this using the tenaculum and still applied the plate appropriately.    I then applied a 1/3 tubular locking plate and secured it proximally and distally with non-locking screws. Bone quality was fair. I used c-arm to confirm satisfactory reduction and fixation.   I then turned my attention to the medial malleolus. Incision was made over the medial malleolus and the fracture exposed and held provisionally with a clamp.  A single guidepin was placed for the 4.0 mm cannulated screw and then confirmation of reduction was made with fluoroscopy.  The reduction of the medial piece was actually fairly challenging.  There was a significant amount of soft tissue interposition, and also a moderate amount of comminution.  The keys were not easily found.  The syndesmosis was stressed using live fluoroscopy and found to be slightly unstable, and so I elected to augment the distal fixation with a syndesmotic screw, rather than a tight rope, and also to augment the syndesmosis stability.  The wounds were irrigated, and closed with vicryl with routine closure for the skin. The wounds were injected with local anesthetic. Sterile gauze was applied  followed by a posterior splint. She was awakened and returned to the PACU in stable and satisfactory condition. There were no  complications.

## 2018-11-20 NOTE — Anesthesia Postprocedure Evaluation (Signed)
Anesthesia Post Note  Patient: Tyrese Ficek  Procedure(s) Performed: OPEN REDUCTION INTERNAL FIXATION (ORIF) ANKLE FRACTURE AND SYNDESMOSIS (Right Ankle)     Patient location during evaluation: PACU Anesthesia Type: Regional and General Level of consciousness: awake and alert Pain management: pain level controlled Vital Signs Assessment: post-procedure vital signs reviewed and stable Respiratory status: spontaneous breathing, nonlabored ventilation, respiratory function stable and patient connected to nasal cannula oxygen Cardiovascular status: blood pressure returned to baseline and stable Postop Assessment: no apparent nausea or vomiting Anesthetic complications: no    Last Vitals:  Vitals:   11/20/18 1845 11/20/18 1900  BP: 115/83 134/90  Pulse: 61 78  Resp: 12 14  Temp:  36.6 C  SpO2: 99% 98%    Last Pain:  Vitals:   11/20/18 1915  TempSrc:   PainSc: 0-No pain                 Kaylin Schellenberg L Naia Ruff

## 2018-11-20 NOTE — Anesthesia Procedure Notes (Signed)
Anesthesia Regional Block: Popliteal block   Pre-Anesthetic Checklist: ,, timeout performed, Correct Patient, Correct Site, Correct Laterality, Correct Procedure, Correct Position, site marked, Risks and benefits discussed,  Surgical consent,  Pre-op evaluation,  At surgeon's request and post-op pain management  Laterality: Right  Prep: Maximum Sterile Barrier Precautions used, chloraprep       Needles:  Injection technique: Single-shot  Needle Type: Echogenic Stimulator Needle     Needle Length: 9cm  Needle Gauge: 22     Additional Needles:   Procedures:,,,, ultrasound used (permanent image in chart),,,,  Narrative:  Start time: 11/20/2018 1:54 PM End time: 11/20/2018 2:04 PM Injection made incrementally with aspirations every 5 mL.  Performed by: Personally  Anesthesiologist: Freddrick March, MD  Additional Notes: Monitors applied. No increased pain on injection. No increased resistance to injection. Injection made in 5cc increments. Good needle visualization. Patient tolerated procedure well.

## 2018-11-20 NOTE — Anesthesia Preprocedure Evaluation (Addendum)
Anesthesia Evaluation  Patient identified by MRN, date of birth, ID band Patient awake    Reviewed: Allergy & Precautions, NPO status , Patient's Chart, lab work & pertinent test results  History of Anesthesia Complications (+) PONV  Airway Mallampati: II  TM Distance: >3 FB Neck ROM: Full    Dental no notable dental hx. (+) Teeth Intact, Dental Advisory Given   Pulmonary neg pulmonary ROS,    Pulmonary exam normal breath sounds clear to auscultation       Cardiovascular negative cardio ROS Normal cardiovascular exam Rhythm:Regular Rate:Normal     Neuro/Psych negative neurological ROS  negative psych ROS   GI/Hepatic Neg liver ROS, GERD  Medicated,  Endo/Other  negative endocrine ROS  Renal/GU negative Renal ROS  negative genitourinary   Musculoskeletal  (+) Arthritis ,   Abdominal   Peds  Hematology negative hematology ROS (+)   Anesthesia Other Findings After discussion with pt, propofol and zofran are not true allergies. Pt amenable to receiving both medications during surgery as she does have a history of severe PONV.   Reproductive/Obstetrics negative OB ROS                           Anesthesia Physical Anesthesia Plan  ASA: II  Anesthesia Plan: General and Regional   Post-op Pain Management:  Regional for Post-op pain   Induction: Intravenous  PONV Risk Score and Plan: 3 and Midazolam, Ondansetron, Dexamethasone and TIVA  Airway Management Planned: LMA  Additional Equipment:   Intra-op Plan:   Post-operative Plan: Extubation in OR  Informed Consent: I have reviewed the patients History and Physical, chart, labs and discussed the procedure including the risks, benefits and alternatives for the proposed anesthesia with the patient or authorized representative who has indicated his/her understanding and acceptance.     Dental advisory given  Plan Discussed with:  CRNA  Anesthesia Plan Comments:        Anesthesia Quick Evaluation

## 2018-11-20 NOTE — Discharge Instructions (Signed)
Diet: As you were doing prior to hospitalization   Shower:  May shower but keep the wounds dry, use an occlusive plastic wrap, NO SOAKING IN TUB.  If the bandage gets wet, change with a clean dry gauze.  If you have a splint on, leave the splint in place and keep the splint dry with a plastic bag.  Dressing:  You may change your dressing 3-5 days after surgery, unless you have a splint.  If you have a splint, then just leave the splint in place and we will change your bandages during your first follow-up appointment.    If you had hand or foot surgery, we will plan to remove your stitches in about 2 weeks in the office.  For all other surgeries, there are sticky tapes (steri-strips) on your wounds and all the stitches are absorbable.  Leave the steri-strips in place when changing your dressings, they will peel off with time, usually 2-3 weeks.  Activity:  Increase activity slowly as tolerated, but follow the weight bearing instructions below.  The rules on driving is that you can not be taking narcotics while you drive, and you must feel in control of the vehicle.    Weight Bearing:   Do not put any weight on your broken ankle..    To prevent constipation: you may use a stool softener such as -  Colace (over the counter) 100 mg by mouth twice a day  Drink plenty of fluids (prune juice may be helpful) and high fiber foods Miralax (over the counter) for constipation as needed.    Itching:  If you experience itching with your medications, try taking only a single pain pill, or even half a pain pill at a time.  You may take up to 10 pain pills per day, and you can also use benadryl over the counter for itching or also to help with sleep.   Precautions:  If you experience chest pain or shortness of breath - call 911 immediately for transfer to the hospital emergency department!!  If you develop a fever greater that 101 F, purulent drainage from wound, increased redness or drainage from wound, or  calf pain -- Call the office at [phone]                                                Follow- Up Appointment:  Please call for an appointment to be seen in 2 weeks [place] - [phone]   Post Anesthesia Home Care Instructions  Activity: Get plenty of rest for the remainder of the day. A responsible adult should stay with you for 24 hours following the procedure.  For the next 24 hours, DO NOT: -Drive a car -Paediatric nurse -Drink alcoholic beverages -Take any medication unless instructed by your physician -Make any legal decisions or sign important papers.  Meals: Start with liquid foods such as gelatin or soup. Progress to regular foods as tolerated. Avoid greasy, spicy, heavy foods. If nausea and/or vomiting occur, drink only clear liquids until the nausea and/or vomiting subsides. Call your physician if vomiting continues.  Special Instructions/Symptoms: Your throat may feel dry or sore from the anesthesia or the breathing tube placed in your throat during surgery. If this causes discomfort, gargle with warm salt water. The discomfort should disappear within 24 hours.  If you had a scopolamine patch placed behind your  ear for the management of post- operative nausea and/or vomiting:  1. The medication in the patch is effective for 72 hours, after which it should be removed.  Wrap patch in a tissue and discard in the trash. Wash hands thoroughly with soap and water. 2. You may remove the patch earlier than 72 hours if you experience unpleasant side effects which may include dry mouth, dizziness or visual disturbances. 3. Avoid touching the patch. Wash your hands with soap and water after contact with the patch.   Regional Anesthesia Blocks  1. Numbness or the inability to move the "blocked" extremity may last from 3-48 hours after placement. The length of time depends on the medication injected and your individual response to the medication. If the numbness is not going  away after 48 hours, call your surgeon.  2. The extremity that is blocked will need to be protected until the numbness is gone and the  Strength has returned. Because you cannot feel it, you will need to take extra care to avoid injury. Because it may be weak, you may have difficulty moving it or using it. You may not know what position it is in without looking at it while the block is in effect.  3. For blocks in the legs and feet, returning to weight bearing and walking needs to be done carefully. You will need to wait until the numbness is entirely gone and the strength has returned. You should be able to move your leg and foot normally before you try and bear weight or walk. You will need someone to be with you when you first try to ensure you do not fall and possibly risk injury.  4. Bruising and tenderness at the needle site are common side effects and will resolve in a few days.  5. Persistent numbness or new problems with movement should be communicated to the surgeon or the [facility] [phone] [place] [phone]).

## 2018-11-20 NOTE — Interval H&P Note (Signed)
History and Physical Interval Note:  11/20/2018 12:52 PM  Dawn Mcguire  has presented today for surgery, with the diagnosis of RIGHT ANKLE FRACTURE.  The various methods of treatment have been discussed with the patient and family. After consideration of risks, benefits and other options for treatment, the patient has consented to  Procedure(s): OPEN REDUCTION INTERNAL FIXATION (ORIF) ANKLE FRACTURE AND SYNDESMOSIS (Right) as a surgical intervention.  The patient's history has been reviewed, patient examined, no change in status, stable for surgery.  I have reviewed the patient's chart and labs.  Questions were answered to the patient's satisfaction.     Dawn Mcguire

## 2018-11-20 NOTE — Progress Notes (Signed)
Assisted Dr. Woodrum with right, ultrasound guided, popliteal, adductor canal block. Side rails up, monitors on throughout procedure. See vital signs in flow sheet. Tolerated Procedure well. °

## 2018-11-20 NOTE — Anesthesia Procedure Notes (Signed)
Anesthesia Regional Block: Adductor canal block   Pre-Anesthetic Checklist: ,, timeout performed, Correct Patient, Correct Site, Correct Laterality, Correct Procedure, Correct Position, site marked, Risks and benefits discussed,  Surgical consent,  Pre-op evaluation,  At surgeon's request and post-op pain management  Laterality: Right  Prep: Maximum Sterile Barrier Precautions used, chloraprep       Needles:  Injection technique: Single-shot  Needle Type: Echogenic Stimulator Needle     Needle Length: 9cm  Needle Gauge: 22     Additional Needles:   Procedures:,,,, ultrasound used (permanent image in chart),,,,  Narrative:  Start time: 11/20/2018 2:05 PM End time: 11/20/2018 2:15 PM Injection made incrementally with aspirations every 5 mL.  Performed by: Personally  Anesthesiologist: Freddrick March, MD  Additional Notes: Monitors applied. No increased pain on injection. No increased resistance to injection. Injection made in 5cc increments. Good needle visualization. Patient tolerated procedure well.

## 2018-11-23 ENCOUNTER — Encounter (HOSPITAL_BASED_OUTPATIENT_CLINIC_OR_DEPARTMENT_OTHER): Payer: Self-pay | Admitting: Orthopedic Surgery

## 2018-12-14 ENCOUNTER — Telehealth (INDEPENDENT_AMBULATORY_CARE_PROVIDER_SITE_OTHER): Payer: Self-pay | Admitting: Family Medicine

## 2018-12-14 ENCOUNTER — Encounter: Payer: Self-pay | Admitting: Family Medicine

## 2018-12-14 VITALS — BP 133/85 | HR 80 | Temp 98.2°F | Ht 68.0 in

## 2018-12-14 DIAGNOSIS — L255 Unspecified contact dermatitis due to plants, except food: Secondary | ICD-10-CM

## 2018-12-14 MED ORDER — TRIAMCINOLONE ACETONIDE 0.5 % EX OINT: TOPICAL_OINTMENT | CUTANEOUS | 0 refills | Status: DC

## 2018-12-14 NOTE — Progress Notes (Signed)
Virtual Visit via Video Note  I connected with Dawn Mcguire on 12/14/18 at 10:00 AM EDT by a video enabled telemedicine application and verified that I am speaking with the correct person using two identifiers.  Location: Patient: home Provider:    Established Patient Office Visit  Subjective:  Patient ID: Dawn Mcguire, female    DOB: 04/22/1966  Age: 53 y.o. MRN: [id number]  CC:  Chief Complaint  Patient presents with  . Poison Ivy    HPI Dawn Mcguire presents for evaluation treatment of a pruritic rash on her right arm and abdomen.  She says it has the appearance of Rous dermatitis that she has had in the past and she believes that she may have picked it up from her dog.  Is been there for 5 days.  She is been using an over-the-counter poison ivy preparation that has not been helpful.  Patient has allergy listed to corticosteroids and propofol but propofol was again recently used status post a recent ankle fracture and she tolerated the drugs well.  She is also tolerated steroids well including prednisone.  Past Medical History:  Diagnosis Date  . Arthritis   . Carpal tunnel syndrome of right wrist   . Closed displaced trimalleolar fracture of right ankle 11/20/2018  . GERD (gastroesophageal reflux disease)   . Psoriasis     Past Surgical History:  Procedure Laterality Date  . ABDOMINAL HYSTERECTOMY    . BLADDER REPAIR    . BREAST SURGERY    . CARPAL TUNNEL RELEASE     on the right wrist  . CHOLECYSTECTOMY    . INSERTION OF MESH N/A 10/16/2017   Procedure: INSERTION OF MESH;  Surgeon: Donnie Mesa, MD;  Location: [place];  Service: General;  Laterality: N/A;  . IR US GUIDE BX ASP/DRAIN  12/31/2017  . ORIF ANKLE FRACTURE Right 11/20/2018   Procedure: OPEN REDUCTION INTERNAL FIXATION (ORIF) ANKLE FRACTURE AND SYNDESMOSIS;  Surgeon: Marchia Bond, MD;  Location: [place];  Service: Orthopedics;  Laterality: Right;  . VENTRAL HERNIA REPAIR N/A  10/16/2017   Procedure: OPEN VENTRAL HERNIA REPAIR WITH MESH;  Surgeon: Donnie Mesa, MD;  Location: [place];  Service: General;  Laterality: N/A;    Family History  Problem Relation Age of Onset  . Arthritis Mother   . Heart attack Mother   . High blood pressure Mother   . Cancer Father   . Diabetes Father   . Arthritis Brother   . Arthritis Brother   . Arthritis Brother   . Diabetes Son     Social History   Socioeconomic History  . Marital status: Married    Spouse name: Not on file  . Number of children: Not on file  . Years of education: Not on file  . Highest education level: Not on file  Occupational History  . Not on file  Social Needs  . Financial resource strain: Not on file  . Food insecurity:    Worry: Not on file    Inability: Not on file  . Transportation needs:    Medical: Not on file    Non-medical: Not on file  Tobacco Use  . Smoking status: Never Smoker  . Smokeless tobacco: Never Used  Substance and Sexual Activity  . Alcohol use: No  . Drug use: No  . Sexual activity: Yes    Birth control/protection: Surgical  Lifestyle  . Physical activity:    Days per week: Not on file    Minutes  per session: Not on file  . Stress: Not on file  Relationships  . Social connections:    Talks on phone: Not on file    Gets together: Not on file    Attends religious service: Not on file    Active member of club or organization: Not on file    Attends meetings of clubs or organizations: Not on file    Relationship status: Not on file  . Intimate partner violence:    Fear of current or ex partner: Not on file    Emotionally abused: Not on file    Physically abused: Not on file    Forced sexual activity: Not on file  Other Topics Concern  . Not on file  Social History Narrative  . Not on file    Outpatient Medications Prior to Visit  Medication Sig Dispense Refill  . Cyanocobalamin (VITAMIN B-12 PO) Take 1 tablet by mouth daily.    . Multiple  Vitamins-Minerals (MULTI ADULT GUMMIES PO) Take 1 tablet by mouth daily.    Marland Kitchen omeprazole (PRILOSEC) 20 MG capsule Take 20 mg by mouth daily.    . sennosides-docusate sodium (SENOKOT-S) 8.6-50 MG tablet Take 2 tablets by mouth daily. 30 tablet 1  . VITAMIN D PO Take 1 tablet by mouth daily.    Marland Kitchen oxyCODONE (ROXICODONE) 5 MG immediate release tablet Take 1 tablet (5 mg total) by mouth every 4 (four) hours as needed for severe pain. 30 tablet 0   No facility-administered medications prior to visit.     Allergies  Allergen Reactions  . Latex Other (See Comments)    Tears skin and break out  . Sulfa Antibiotics Itching and Nausea And Vomiting    Skin becomes red and feels like it is burning  . Sulfasalazine Hives, Itching and Nausea And Vomiting    Skin becomes red and feels like it is burning Skin becomes red and feels like it is burning  . Corticosteroids Hives, Itching and Rash    fever  . Hydrocodone Nausea And Vomiting  . Nitrofurantoin Monohyd Macro Nausea And Vomiting  . Penicillins Hives, Itching and Rash    Has patient had a PCN reaction causing immediate rash, facial/tongue/throat swelling, SOB or lightheadedness with hypotension: No Has patient had a PCN reaction causing severe rash involving mucus membranes or skin necrosis: No Has patient had a PCN reaction that required hospitalization: No Has patient had a PCN reaction occurring within the last 10 years: No If all of the above answers are "NO", then may proceed with Cephalosporin use.   . Prednisone Hives, Itching and Other (See Comments)    fever  . Propofol Hives, Rash and Other (See Comments)    Fever and hair loss  . Tramadol Nausea Only    ROS Review of Systems  Constitutional: Negative.   Respiratory: Negative.   Cardiovascular: Negative.   Skin: Positive for color change and rash.  Neurological: Negative.   Hematological: Negative.   Psychiatric/Behavioral: Negative.       Objective:    Physical Exam   Constitutional: She is oriented to person, place, and time. She appears well-developed and well-nourished. No distress.  HENT:  Head: Normocephalic and atraumatic.  Right Ear: External ear normal.  Left Ear: External ear normal.  Eyes: Conjunctivae are normal. Right eye exhibits no discharge. Left eye exhibits no discharge. No scleral icterus.  Pulmonary/Chest: Effort normal.  Neurological: She is alert and oriented to person, place, and time.  Skin: Skin is warm  and dry. She is not diaphoretic.     Psychiatric: She has a normal mood and affect. Her behavior is normal.    BP 133/85   Pulse 80   Temp 98.2 F (36.8 C)   Ht 5\' 8"  (1.727 m)   BMI 37.86 kg/m  Wt Readings from Last 3 Encounters:  11/19/18 249 lb (112.9 kg)  11/19/18 249 lb (112.9 kg)  06/08/18 259 lb (117.5 kg)     Health Maintenance Due  Topic Date Due  . Hepatitis C Screening  [birth year]  . HIV Screening  09/05/1978  . TETANUS/TDAP  09/05/1982  . PAP SMEAR-Modifier  09/05/1984  . MAMMOGRAM  09/05/2013  . COLONOSCOPY  09/05/2013    There are no preventive care reminders to display for this patient.  No results found for: TSH Lab Results  Component Value Date   WBC 7.9 12/31/2017   HGB 14.0 12/31/2017   HCT 42.5 12/31/2017   MCV 91.0 12/31/2017   PLT 272 12/31/2017   Lab Results  Component Value Date   NA 140 11/28/2017   K 3.8 11/28/2017   CO2 23 11/28/2017   GLUCOSE 90 11/28/2017   BUN 14 11/28/2017   CREATININE 0.72 11/28/2017   CALCIUM 9.2 11/28/2017   ANIONGAP 11 11/28/2017   No results found for: CHOL No results found for: HDL No results found for: LDLCALC No results found for: TRIG No results found for: CHOLHDL No results found for: HGBA1C    Assessment & Plan:   Problem List Items Addressed This Visit      Musculoskeletal and Integument   Rhus dermatitis - Primary   Relevant Medications   triamcinolone ointment (KENALOG) 0.5 %      Meds ordered this encounter   Medications  . triamcinolone ointment (KENALOG) 0.5 %    Sig: Apply a thin coat to poison vine rash twice daily. Not for face or genital areas.    Dispense:  45 g    Refill:  0    Follow-up: Return if symptoms worsen or fail to improve.    Dawn Maw, MD   I discussed the limitations of evaluation and management by telemedicine and the availability of in person appointments. The patient expressed understanding and agreed to proceed.  History of Present Illness:    Observations/Objective:   Assessment and Plan:   Follow Up Instructions:    I discussed the assessment and treatment plan with the patient. The patient was provided an opportunity to ask questions and all were answered. The patient agreed with the plan and demonstrated an understanding of the instructions.   The patient was advised to call back or seek an in-person evaluation if the symptoms worsen or if the condition fails to improve as anticipated.  I provided 15 minutes of non-face-to-face time during this encounter.   We elected to use a Dosepak at this time with patient's healing fibular fracture.  May be forced to use this if more rash develops.  Patient is agreeable to trying high potency steroid ointment first.  She will let me know later this week if more rashes developing.

## 2018-12-18 ENCOUNTER — Telehealth: Payer: Self-pay | Admitting: Behavioral Health

## 2018-12-18 NOTE — Telephone Encounter (Signed)
Patient called to provide PCP with an update on symptoms from last visit on 12/14/2018. Per the patient, she states "poision oak has cleared up and no new spots or reactions; steroid ointment is working great". There were no questions or concerns prior to call ending.  Message routed to PCP for review.

## 2019-02-11 ENCOUNTER — Encounter: Payer: Self-pay | Admitting: Family Medicine

## 2019-02-11 ENCOUNTER — Other Ambulatory Visit: Payer: Self-pay

## 2019-02-11 ENCOUNTER — Ambulatory Visit (INDEPENDENT_AMBULATORY_CARE_PROVIDER_SITE_OTHER): Payer: No Typology Code available for payment source | Admitting: Family Medicine

## 2019-02-11 VITALS — BP 161/101 | HR 77 | Ht 68.0 in | Wt 246.0 lb

## 2019-02-11 DIAGNOSIS — M79672 Pain in left foot: Secondary | ICD-10-CM

## 2019-02-11 MED ORDER — PENNSAID 2 % TD SOLN: 1.0000 "application " | Freq: Two times a day (BID) | TRANSDERMAL | 3 refills | Status: DC

## 2019-02-11 NOTE — Patient Instructions (Signed)
Nice to meet you Please try the rayos  Please try the pennsaid to rub on the area after the rayos.  Please try the exercises   Please send me a message in MyChart with any questions or updates.  Please see me back in 4 weeks or if you would like to try some custom orthotics.   --Dr. Raeford Razor

## 2019-02-11 NOTE — Progress Notes (Signed)
Medication Samples have been provided to the patient.  Drug name: Rayos       Strength: 5mg         Qty: 1 Box  LOT: [id number]  Exp.Date: 11/2019  Dosing instructions: take 2 tablets at bedtime  The patient has been instructed regarding the correct time, dose, and frequency of taking this medication, including desired effects and most common side effects.   Sherrie George, [place] 4:40 PM 02/11/2019

## 2019-02-11 NOTE — Progress Notes (Signed)
Dawn Mcguire - 53 y.o. female MRN [id number]  Date of birth: [birth year]  SUBJECTIVE:  Including CC & ROS.  Chief Complaint  Patient presents with  . Foot Pain    left foot 2nd toe    Dawn Mcguire is a 53 y.o. female that is presenting with left dorsal midfoot pain.  The pain is been ongoing over the past few weeks.  She feels the pain over the metatarsal area.  The pain is worse after walking.  It is localized to this area.  She denies any injury or inciting event.  She is recovering from a right ankle fracture and surgery.  Has not tried anything for the pain.  No prior surgery to the foot.   Review of Systems  Constitutional: Negative for fever.  HENT: Negative for congestion.   Respiratory: Negative for cough.   Cardiovascular: Negative for chest pain.  Gastrointestinal: Negative for abdominal pain.  Musculoskeletal: Positive for arthralgias and gait problem.  Skin: Negative for color change.  Neurological: Negative for weakness.  Hematological: Negative for adenopathy.    HISTORY: Past Medical, Surgical, Social, and Family History Reviewed & Updated per EMR.   Pertinent Historical Findings include:  Past Medical History:  Diagnosis Date  . Arthritis   . Carpal tunnel syndrome of right wrist   . Closed displaced trimalleolar fracture of right ankle 11/20/2018  . GERD (gastroesophageal reflux disease)   . Psoriasis     Past Surgical History:  Procedure Laterality Date  . ABDOMINAL HYSTERECTOMY    . BLADDER REPAIR    . BREAST SURGERY    . CARPAL TUNNEL RELEASE     on the right wrist  . CHOLECYSTECTOMY    . INSERTION OF MESH N/A 10/16/2017   Procedure: INSERTION OF MESH;  Surgeon: Donnie Mesa, MD;  Location: [place];  Service: General;  Laterality: N/A;  . IR US GUIDE BX ASP/DRAIN  12/31/2017  . ORIF ANKLE FRACTURE Right 11/20/2018   Procedure: OPEN REDUCTION INTERNAL FIXATION (ORIF) ANKLE FRACTURE AND SYNDESMOSIS;  Surgeon: Marchia Bond, MD;  Location: [place];  Service: Orthopedics;  Laterality: Right;  . VENTRAL HERNIA REPAIR N/A 10/16/2017   Procedure: OPEN VENTRAL HERNIA REPAIR WITH MESH;  Surgeon: Donnie Mesa, MD;  Location: [place];  Service: General;  Laterality: N/A;    Allergies  Allergen Reactions  . Latex Other (See Comments)    Tears skin and break out  . Sulfa Antibiotics Itching and Nausea And Vomiting    Skin becomes red and feels like it is burning  . Sulfasalazine Hives, Itching and Nausea And Vomiting    Skin becomes red and feels like it is burning Skin becomes red and feels like it is burning  . Corticosteroids Hives, Itching and Rash    fever  . Hydrocodone Nausea And Vomiting  . Nitrofurantoin Monohyd Macro Nausea And Vomiting  . Penicillins Hives, Itching and Rash    Has patient had a PCN reaction causing immediate rash, facial/tongue/throat swelling, SOB or lightheadedness with hypotension: No Has patient had a PCN reaction causing severe rash involving mucus membranes or skin necrosis: No Has patient had a PCN reaction that required hospitalization: No Has patient had a PCN reaction occurring within the last 10 years: No If all of the above answers are "NO", then may proceed with Cephalosporin use.   . Prednisone Hives, Itching and Other (See Comments)    fever  . Propofol Hives, Rash and Other (See Comments)    Fever  and hair loss  . Tramadol Nausea Only    Family History  Problem Relation Age of Onset  . Arthritis Mother   . Heart attack Mother   . High blood pressure Mother   . Cancer Father   . Diabetes Father   . Arthritis Brother   . Arthritis Brother   . Arthritis Brother   . Diabetes Son      Social History   Socioeconomic History  . Marital status: Married    Spouse name: Not on file  . Number of children: Not on file  . Years of education: Not on file  . Highest education level: Not on file  Occupational History  . Not on file  Social Needs  . Financial resource  strain: Not on file  . Food insecurity    Worry: Not on file    Inability: Not on file  . Transportation needs    Medical: Not on file    Non-medical: Not on file  Tobacco Use  . Smoking status: Never Smoker  . Smokeless tobacco: Never Used  Substance and Sexual Activity  . Alcohol use: No  . Drug use: No  . Sexual activity: Yes    Birth control/protection: Surgical  Lifestyle  . Physical activity    Days per week: Not on file    Minutes per session: Not on file  . Stress: Not on file  Relationships  . Social Herbalist on phone: Not on file    Gets together: Not on file    Attends religious service: Not on file    Active member of club or organization: Not on file    Attends meetings of clubs or organizations: Not on file    Relationship status: Not on file  . Intimate partner violence    Fear of current or ex partner: Not on file    Emotionally abused: Not on file    Physically abused: Not on file    Forced sexual activity: Not on file  Other Topics Concern  . Not on file  Social History Narrative  . Not on file     PHYSICAL EXAM:  VS: BP (!) 161/101   Pulse 77   Ht 5\' 8"  (1.727 m)   Wt 246 lb (111.6 kg)   BMI 37.40 kg/m  Physical Exam Gen: NAD, alert, cooperative with exam, well-appearing ENT: normal lips, normal nasal mucosa,  Eye: normal EOM, normal conjunctiva and lids CV:  no edema, +2 pedal pulses   Resp: no accessory muscle use, non-labored,  Skin: no rashes, no areas of induration  Neuro: normal tone, normal sensation to touch Psych:  normal insight, alert and oriented MSK:  Left foot: Bossing of the tarsometatarsal joints. Tenderness to palpation over the tarsometatarsal joints. Some loss of longitudinal arch and transverse arch. Hammertoe owing of the second digit. Neurovascular intact     ASSESSMENT & PLAN:   Left foot pain Likely related to the degenerative changes in the midfoot as well as changes in ambulation with the  ongoing recovery of her right ankle fracture. -Placed in green sport insoles.  Placed a scaphoid pad and metatarsal pad on the left. -Pennsaid -Provided Rayos samples. -Counseled on home exercise therapy and supportive care. -If no improvement he consider custom orthotics

## 2019-02-11 NOTE — Assessment & Plan Note (Signed)
Likely related to the degenerative changes in the midfoot as well as changes in ambulation with the ongoing recovery of her right ankle fracture. -Placed in green sport insoles.  Placed a scaphoid pad and metatarsal pad on the left. -Pennsaid -Provided Rayos samples. -Counseled on home exercise therapy and supportive care. -If no improvement he consider custom orthotics

## 2019-02-23 ENCOUNTER — Telehealth: Payer: Self-pay | Admitting: Family Medicine

## 2019-02-23 NOTE — Telephone Encounter (Signed)
Patient returned call. She states she already has the Voltaren Rx, her question is regarding taking the steroid Rayos.   She is asking is she should continue taking Rayos. If so, she is asking to have the medication called into [facility] in [place] instead of using mail order.

## 2019-02-23 NOTE — Telephone Encounter (Signed)
Talked with patient about her questions.   Rosemarie Ax, MD [facility] 02/23/2019, 1:34 PM

## 2019-02-23 NOTE — Telephone Encounter (Signed)
Left VM for patient. If she calls back please have her speak with a nurse/CMA and inform that I sent in pennsaid. She can try voltaren which is over the counter.   If any questions then please take the best time and phone number to call and I will try to call her back.   Rosemarie Ax, MD [facility] 02/23/2019, 8:33 AM

## 2019-03-04 ENCOUNTER — Ambulatory Visit (INDEPENDENT_AMBULATORY_CARE_PROVIDER_SITE_OTHER)
Admission: EM | Admit: 2019-03-04 | Discharge: 2019-03-04 | Disposition: A | Discharge status: Home / Self Care | Payer: No Typology Code available for payment source | Source: Home / Self Care

## 2019-03-04 ENCOUNTER — Encounter (HOSPITAL_COMMUNITY): Payer: Self-pay | Admitting: Emergency Medicine

## 2019-03-04 ENCOUNTER — Emergency Department (HOSPITAL_COMMUNITY): Payer: No Typology Code available for payment source

## 2019-03-04 ENCOUNTER — Other Ambulatory Visit: Payer: Self-pay

## 2019-03-04 ENCOUNTER — Emergency Department (HOSPITAL_COMMUNITY)
Admission: EM | Admit: 2019-03-04 | Discharge: 2019-03-04 | Disposition: A | Discharge status: Home / Self Care | Payer: No Typology Code available for payment source | Attending: Emergency Medicine | Admitting: Emergency Medicine

## 2019-03-04 DIAGNOSIS — Z91040 Latex allergy status: Secondary | ICD-10-CM | POA: Insufficient documentation

## 2019-03-04 DIAGNOSIS — R102 Pelvic and perineal pain: Secondary | ICD-10-CM

## 2019-03-04 DIAGNOSIS — R197 Diarrhea, unspecified: Secondary | ICD-10-CM | POA: Insufficient documentation

## 2019-03-04 DIAGNOSIS — R310 Gross hematuria: Secondary | ICD-10-CM

## 2019-03-04 DIAGNOSIS — N201 Calculus of ureter: Secondary | ICD-10-CM | POA: Diagnosis not present

## 2019-03-04 DIAGNOSIS — R1030 Lower abdominal pain, unspecified: Secondary | ICD-10-CM | POA: Diagnosis present

## 2019-03-04 DIAGNOSIS — R10815 Periumbilic abdominal tenderness: Secondary | ICD-10-CM | POA: Insufficient documentation

## 2019-03-04 DIAGNOSIS — N3001 Acute cystitis with hematuria: Secondary | ICD-10-CM | POA: Diagnosis not present

## 2019-03-04 DIAGNOSIS — Z79899 Other long term (current) drug therapy: Secondary | ICD-10-CM | POA: Insufficient documentation

## 2019-03-04 LAB — POCT URINALYSIS DIP (DEVICE)
Bilirubin Urine: NEGATIVE
Glucose, UA: NEGATIVE mg/dL
Ketones, ur: NEGATIVE mg/dL
Nitrite: NEGATIVE
Protein, ur: NEGATIVE mg/dL
Specific Gravity, Urine: 1.025 (ref 1.005–1.030)
Urobilinogen, UA: 0.2 mg/dL (ref 0.0–1.0)
pH: 6 (ref 5.0–8.0)

## 2019-03-04 LAB — COMPREHENSIVE METABOLIC PANEL
ALT: 16 U/L (ref 0–44)
AST: 19 U/L (ref 15–41)
Albumin: 4.3 g/dL (ref 3.5–5.0)
Alkaline Phosphatase: 108 U/L (ref 38–126)
Anion gap: 13 (ref 5–15)
BUN: 13 mg/dL (ref 6–20)
CO2: 21 mmol/L — ABNORMAL LOW (ref 22–32)
Calcium: 9.2 mg/dL (ref 8.9–10.3)
Chloride: 105 mmol/L (ref 98–111)
Creatinine, Ser: 0.73 mg/dL (ref 0.44–1.00)
GFR calc Af Amer: >60 mL/min (ref >60)
GFR calc non Af Amer: >60 mL/min (ref >60)
Glucose, Bld: 98 mg/dL (ref 70–99)
Potassium: 3.9 mmol/L (ref 3.5–5.1)
Sodium: 139 mmol/L (ref 135–145)
Total Bilirubin: 1 mg/dL (ref 0.3–1.2)
Total Protein: 7.5 g/dL (ref 6.5–8.1)

## 2019-03-04 LAB — URINALYSIS, ROUTINE W REFLEX MICROSCOPIC
Bilirubin Urine: NEGATIVE
Glucose, UA: NEGATIVE mg/dL
Ketones, ur: NEGATIVE mg/dL
Leukocytes,Ua: NEGATIVE
Nitrite: NEGATIVE
Protein, ur: 100 mg/dL — AB
RBC / HPF: >50 RBC/hpf — ABNORMAL HIGH (ref 0–5)
Specific Gravity, Urine: 1.025 (ref 1.005–1.030)
pH: 5 (ref 5.0–8.0)

## 2019-03-04 LAB — CBC
HCT: 45.3 % (ref 36.0–46.0)
Hemoglobin: 14.8 g/dL (ref 12.0–15.0)
MCH: 30.1 pg (ref 26.0–34.0)
MCHC: 32.7 g/dL (ref 30.0–36.0)
MCV: 92.1 fL (ref 80.0–100.0)
Platelets: 314 10*3/uL (ref 150–400)
RBC: 4.92 MIL/uL (ref 3.87–5.11)
RDW: 12.6 % (ref 11.5–15.5)
WBC: 8.5 10*3/uL (ref 4.0–10.5)
nRBC: 0 % (ref 0.0–0.2)

## 2019-03-04 LAB — LIPASE, BLOOD: Lipase: 33 U/L (ref 11–51)

## 2019-03-04 MED ORDER — HYDROCODONE-ACETAMINOPHEN 5-325 MG PO TABS: 1.0000 | ORAL_TABLET | Freq: Four times a day (QID) | ORAL | 0 refills | Status: DC | PRN

## 2019-03-04 MED ORDER — MORPHINE SULFATE (PF) 4 MG/ML IV SOLN
4.0000 mg | Freq: Once | INTRAVENOUS | Status: AC
  Administered 2019-03-04: 4 mg via INTRAVENOUS
  Filled 2019-03-04: qty 1

## 2019-03-04 MED ORDER — SODIUM CHLORIDE 0.9 % IV SOLN
1.0000 g | Freq: Once | INTRAVENOUS | Status: AC
  Administered 2019-03-04: 1 g via INTRAVENOUS
  Filled 2019-03-04: qty 10

## 2019-03-04 MED ORDER — ONDANSETRON HCL 4 MG/2ML IJ SOLN
4.0000 mg | Freq: Once | INTRAMUSCULAR | Status: AC
  Administered 2019-03-04: 4 mg via INTRAVENOUS
  Filled 2019-03-04: qty 2

## 2019-03-04 MED ORDER — SODIUM CHLORIDE 0.9% FLUSH
3.0000 mL | Freq: Once | INTRAVENOUS | Status: AC
  Administered 2019-03-04: 3 mL via INTRAVENOUS

## 2019-03-04 MED ORDER — TAMSULOSIN HCL 0.4 MG PO CAPS: 0.4000 mg | ORAL_CAPSULE | Freq: Every day | ORAL | 0 refills | Status: DC

## 2019-03-04 MED ORDER — CEPHALEXIN 500 MG PO CAPS: 500.0000 mg | ORAL_CAPSULE | Freq: Four times a day (QID) | ORAL | 0 refills | Status: AC

## 2019-03-04 MED ORDER — SODIUM CHLORIDE 0.9 % IV BOLUS
1000.0000 mL | Freq: Once | INTRAVENOUS | Status: AC
  Administered 2019-03-04: 1000 mL via INTRAVENOUS

## 2019-03-04 NOTE — Discharge Instructions (Signed)
Please take all of your antibiotics until finished!   You may develop abdominal discomfort or diarrhea from the antibiotic.  You may help offset this with probiotics which you can buy or get in yogurt. Do not eat  or take the probiotics until 2 hours after your antibiotic.   Take your naproxen twice daily with food for pain.  You can also take extra strength Tylenol as needed for pain but do not take more than 4000 mg of Tylenol daily.  You can take hydrocodone as needed for severe pain but do not drive, drink alcohol, or operate heavy machinery.  You can also cause constipation so take a stool softener with this.  Be aware hydrocodone contains Tylenol as well.  Take Flomax once daily but be aware this medication can lower your blood pressure so closely monitor your blood pressure while you are taking it.  Plenty fluids and get plenty of rest.  I have attached information for dietary modifications to help prevent kidney stones.  [facility] tomorrow to set up a follow-up appointment, tell them you were seen in the emergency department.  Return to the emergency department immediately for any concerning signs or symptoms develop such as fevers, persistent vomiting, or worsening pain.

## 2019-03-04 NOTE — ED Notes (Signed)
Patient pacing , bending forward in pain, tearful.  Patient agreeable to going to ed

## 2019-03-04 NOTE — Discharge Instructions (Signed)
°  Please proceed to the emergency department for further evaluation and treatment of your severe abdominal pain.

## 2019-03-04 NOTE — ED Notes (Signed)
Patient verbalizes understanding of discharge instructions. Opportunity for questioning and answering were provided.  patient discharged from ED.  

## 2019-03-04 NOTE — ED Triage Notes (Addendum)
For 2 weeks has been feeling sharp, shooting pains in pelvic area.  Patient has a history of fissure and has hemorrhoids.  Patient also mentions a history of kidney stones and concerned for this as well.  Intermittently has found blood in urine  Last bm was this morning

## 2019-03-04 NOTE — ED Triage Notes (Signed)
Pt has been having blood in urine for 2 weeks ago and the pain in her lower abd has gradually came on and today pt is in tears with pain in her lower abd with hx of kidney stones. Pt had a urine done at [facility] and was saturated with blood in urine sample.

## 2019-03-04 NOTE — ED Provider Notes (Signed)
[facility]    CSN: [id number] Arrival date & time: 03/04/19  3149     History   Chief Complaint Chief Complaint  Patient presents with  . Urinary Tract Infection    HPI Dawn Mcguire is a 53 y.o. female.   HPI Dawn Mcguire is a 53 y.o. female presenting to UC with c/o gradually worsening sharp pain in her pelvic area.  Pain radiates into her back at times.  Pain today has caused her to be in tears and double over.  Hx of kidney stones as well as several abdominal surgeries for bladder and ventral hernia. Last surgery was last year.  Denies fever, chills. When pain is severe she does have nausea but no vomiting or diarrhea.    Past Medical History:  Diagnosis Date  . Arthritis   . Carpal tunnel syndrome of right wrist   . Closed displaced trimalleolar fracture of right ankle 11/20/2018  . GERD (gastroesophageal reflux disease)   . Psoriasis     Patient Active Problem List   Diagnosis Date Noted  . Left foot pain 02/11/2019  . Closed displaced trimalleolar fracture of right ankle 11/20/2018  . Nonspecific syndrome suggestive of viral illness 11/16/2018  . Bad odor of urine 06/08/2018  . Acute non-recurrent frontal sinusitis 06/08/2018  . Suprapubic pressure 06/08/2018  . Dysfunction of both eustachian tubes 06/08/2018  . Drug allergy, multiple 06/08/2018  . Rhus dermatitis 01/20/2018    Past Surgical History:  Procedure Laterality Date  . ABDOMINAL HYSTERECTOMY    . BLADDER REPAIR    . BREAST SURGERY    . CARPAL TUNNEL RELEASE     on the right wrist  . CHOLECYSTECTOMY    . INSERTION OF MESH N/A 10/16/2017   Procedure: INSERTION OF MESH;  Surgeon: Donnie Mesa, MD;  Location: [facility];  Service: General;  Laterality: N/A;  . IR US GUIDE BX ASP/DRAIN  12/31/2017  . ORIF ANKLE FRACTURE Right 11/20/2018   Procedure: OPEN REDUCTION INTERNAL FIXATION (ORIF) ANKLE FRACTURE AND SYNDESMOSIS;  Surgeon: Marchia Bond, MD;  Location: [facility];   Service: Orthopedics;  Laterality: Right;  . VENTRAL HERNIA REPAIR N/A 10/16/2017   Procedure: OPEN VENTRAL HERNIA REPAIR WITH MESH;  Surgeon: Donnie Mesa, MD;  Location: [facility];  Service: General;  Laterality: N/A;    OB History   No obstetric history on file.      Home Medications    Prior to Admission medications   Medication Sig Start Date End Date Taking? Authorizing Provider  Cyanocobalamin (VITAMIN B-12 PO) Take 1 tablet by mouth daily.   Yes [provider]  naproxen sodium (ALEVE) 220 MG tablet Take 220 mg by mouth.   Yes [provider]  omeprazole (PRILOSEC) 20 MG capsule Take 20 mg by mouth daily.   Yes [provider]  VITAMIN D PO Take 1 tablet by mouth daily.   Yes [provider]  Diclofenac Sodium (PENNSAID) 2 % SOLN Place 1 application onto the skin 2 (two) times daily. 02/11/19   Rosemarie Ax, MD  Multiple Vitamins-Minerals (MULTI ADULT GUMMIES PO) Take 1 tablet by mouth daily.    [provider]  sennosides-docusate sodium (SENOKOT-S) 8.6-50 MG tablet Take 2 tablets by mouth daily. 11/19/18   Marchia Bond, MD  triamcinolone ointment (KENALOG) 0.5 % Apply a thin coat to poison vine rash twice daily. Not for face or genital areas. 12/14/18   Libby Maw, MD    Family History Family  History  Problem Relation Age of Onset  . Arthritis Mother   . Heart attack Mother   . High blood pressure Mother   . Cancer Father   . Diabetes Father   . Arthritis Brother   . Arthritis Brother   . Arthritis Brother   . Diabetes Son     Social History Social History   Tobacco Use  . Smoking status: Never Smoker  . Smokeless tobacco: Never Used  Substance Use Topics  . Alcohol use: No  . Drug use: No     Allergies   Latex, Sulfa antibiotics, Sulfasalazine, Corticosteroids, Hydrocodone, Nitrofurantoin monohyd macro, Penicillins, Prednisone, Propofol, and Tramadol   Review of Systems Review of Systems   Constitutional: Negative for chills and fever.  Gastrointestinal: Positive for abdominal pain and nausea. Negative for vomiting.  Genitourinary: Positive for flank pain ( bilateral), hematuria and pelvic pain. Negative for decreased urine volume, dysuria, frequency, urgency, vaginal bleeding, vaginal discharge and vaginal pain.  Musculoskeletal: Positive for back pain.  Neurological: Negative for dizziness, light-headedness and headaches.     Physical Exam Triage Vital Signs ED Triage Vitals  Enc Vitals Group     BP 03/04/19 0923 (!) 167/102     Pulse Rate 03/04/19 0923 69     Resp 03/04/19 0923 16     Temp 03/04/19 0923 98.1 F (36.7 C)     Temp Source 03/04/19 0923 Temporal     SpO2 03/04/19 0923 98 %     Weight --      Height --      Head Circumference --      Peak Flow --      Pain Score 03/04/19 0933 8     Pain Loc --      Pain Edu? --      Excl. in [place]? --    No data found.  Updated Vital Signs BP (!) 167/102 (BP Location: Left Arm) Comment: RN notified of critical vital signs. SP   Pulse 69   Temp 98.1 F (36.7 C) (Temporal)   Resp 16   SpO2 98%   Visual Acuity Right Eye Distance:   Left Eye Distance:   Bilateral Distance:    Right Eye Near:   Left Eye Near:    Bilateral Near:     Physical Exam Vitals signs and nursing note reviewed.  Constitutional:      General: She is in acute distress.     Appearance: Normal appearance. She is well-developed.     Comments: Pt sitting on exam chair, hunched over in pain holding her lower abdomin, tearful.   HENT:     Head: Normocephalic and atraumatic.     Mouth/Throat:     Mouth: Mucous membranes are moist.  Neck:     Musculoskeletal: Normal range of motion.  Cardiovascular:     Rate and Rhythm: Normal rate.  Pulmonary:     Effort: Pulmonary effort is normal. No respiratory distress.  Musculoskeletal: Normal range of motion.  Skin:    General: Skin is warm and dry.  Neurological:     Mental Status: She is  alert and oriented to person, place, and time.  Psychiatric:        Behavior: Behavior normal.      UC Treatments / Results  Labs (all labs ordered are listed, but only abnormal results are displayed) Labs Reviewed  POCT URINALYSIS DIP (DEVICE) - Abnormal; Notable for the following components:      Result Value  Hgb urine dipstick LARGE (*)    Leukocytes,Ua TRACE (*)    All other components within normal limits    EKG   Radiology No results found.  Procedures Procedures (including critical care time)  Medications Ordered in UC Medications - No data to display  Initial Impression / Assessment and Plan / UC Course  I have reviewed the triage vital signs and the nursing notes.  Pertinent labs & imaging results that were available during my care of the patient were reviewed by me and considered in my medical decision making (see chart for details).     UA only shows trace leukocytes but large blood. Discussed findings with pt Due to severity of pain and hx of abdominal surgeries in area of severe pain, recommend further evaluation in ED. Pt agreeable.  Final Clinical Impressions(s) / UC Diagnoses   Final diagnoses:  Abdominal pain, suprapubic  Hematuria, gross     Discharge Instructions      Please proceed to the emergency department for further evaluation and treatment of your severe abdominal pain.    ED Prescriptions    None     Controlled Substance Prescriptions [place] Controlled Substance Registry consulted? Not Applicable   Tyrell Antonio 03/04/19 1048

## 2019-03-04 NOTE — ED Provider Notes (Signed)
[facility] EMERGENCY DEPARTMENT Provider Note   CSN: [id number] Arrival date & time: 03/04/19  3710    History   Chief Complaint Chief Complaint  Patient presents with  . Abdominal Pain    HPI Dawn Mcguire is a 53 y.o. female with history of arthritis, prior carpal tunnel syndrome, GERD, psoriasis, nephrolithiasis presenting for evaluation of acute onset, progressively worsening lower pelvic pain for 1 week.  Reports sharp stabbing pressure to the suprapubic region radiating to the bilateral flanks.  Worsened sitting upright.  Reports hematuria, dysuria, malodorous urine, urgency and frequency intermittently this week.  Pain has worsened and become more constant.  It is associated with some nausea but denies vomiting.  Has also had a few episodes of watery nonbloody diarrhea.  Denies chest pain, shortness of breath, fevers.  No vaginal itching, bleeding, or discharge and she is status post partial hysterectomy.  Tried over-the-counter medications without relief of her symptoms.  From urgent care for further evaluation.  Reports this feels like prior kidney stone pain.     The history is provided by the patient.    Past Medical History:  Diagnosis Date  . Arthritis   . Carpal tunnel syndrome of right wrist   . Closed displaced trimalleolar fracture of right ankle 11/20/2018  . GERD (gastroesophageal reflux disease)   . Psoriasis     Patient Active Problem List   Diagnosis Date Noted  . Left foot pain 02/11/2019  . Closed displaced trimalleolar fracture of right ankle 11/20/2018  . Nonspecific syndrome suggestive of viral illness 11/16/2018  . Bad odor of urine 06/08/2018  . Acute non-recurrent frontal sinusitis 06/08/2018  . Suprapubic pressure 06/08/2018  . Dysfunction of both eustachian tubes 06/08/2018  . Drug allergy, multiple 06/08/2018  . Rhus dermatitis 01/20/2018    Past Surgical History:  Procedure Laterality Date  . ABDOMINAL HYSTERECTOMY     . BLADDER REPAIR    . BREAST SURGERY    . CARPAL TUNNEL RELEASE     on the right wrist  . CHOLECYSTECTOMY    . INSERTION OF MESH N/A 10/16/2017   Procedure: INSERTION OF MESH;  Surgeon: Donnie Mesa, MD;  Location: [facility];  Service: General;  Laterality: N/A;  . IR US GUIDE BX ASP/DRAIN  12/31/2017  . ORIF ANKLE FRACTURE Right 11/20/2018   Procedure: OPEN REDUCTION INTERNAL FIXATION (ORIF) ANKLE FRACTURE AND SYNDESMOSIS;  Surgeon: Marchia Bond, MD;  Location: [facility];  Service: Orthopedics;  Laterality: Right;  . VENTRAL HERNIA REPAIR N/A 10/16/2017   Procedure: OPEN VENTRAL HERNIA REPAIR WITH MESH;  Surgeon: Donnie Mesa, MD;  Location: [facility];  Service: General;  Laterality: N/A;     OB History   No obstetric history on file.      Home Medications    Prior to Admission medications   Medication Sig Start Date End Date Taking? Authorizing Provider  cephALEXin (KEFLEX) 500 MG capsule Take 1 capsule (500 mg total) by mouth 4 (four) times daily for 7 days. 03/04/19 03/11/19  Davionne Mastrangelo A, PA-C  Cyanocobalamin (VITAMIN B-12 PO) Take 1 tablet by mouth daily.    [provider]  Diclofenac Sodium (PENNSAID) 2 % SOLN Place 1 application onto the skin 2 (two) times daily. 02/11/19   Rosemarie Ax, MD  HYDROcodone-acetaminophen (NORCO/VICODIN) 5-325 MG tablet Take 1 tablet by mouth every 6 (six) hours as needed for severe pain. 03/04/19   Jeniyah Menor A, PA-C  Multiple Vitamins-Minerals (MULTI ADULT GUMMIES PO)  Take 1 tablet by mouth daily.    [provider]  naproxen sodium (ALEVE) 220 MG tablet Take 220 mg by mouth.    [provider]  omeprazole (PRILOSEC) 20 MG capsule Take 20 mg by mouth daily.    [provider]  sennosides-docusate sodium (SENOKOT-S) 8.6-50 MG tablet Take 2 tablets by mouth daily. 11/19/18   Marchia Bond, MD  tamsulosin (FLOMAX) 0.4 MG CAPS capsule Take 1 capsule (0.4 mg total) by mouth daily after supper. 03/04/19    Nils Flack, Dorothy Polhemus A, PA-C  triamcinolone ointment (KENALOG) 0.5 % Apply a thin coat to poison vine rash twice daily. Not for face or genital areas. 12/14/18   Libby Maw, MD  VITAMIN D PO Take 1 tablet by mouth daily.    [provider]    Family History Family History  Problem Relation Age of Onset  . Arthritis Mother   . Heart attack Mother   . High blood pressure Mother   . Cancer Father   . Diabetes Father   . Arthritis Brother   . Arthritis Brother   . Arthritis Brother   . Diabetes Son     Social History Social History   Tobacco Use  . Smoking status: Never Smoker  . Smokeless tobacco: Never Used  Substance Use Topics  . Alcohol use: No  . Drug use: No     Allergies   Latex, Sulfa antibiotics, Sulfasalazine, Corticosteroids, Hydrocodone, Nitrofurantoin monohyd macro, Penicillins, Prednisone, Propofol, and Tramadol   Review of Systems Review of Systems  Constitutional: Negative for chills and fever.  Respiratory: Negative for shortness of breath.   Cardiovascular: Negative for chest pain.  Gastrointestinal: Positive for abdominal pain, diarrhea and nausea. Negative for blood in stool, constipation and vomiting.  Genitourinary: Positive for dysuria, flank pain, frequency, hematuria and urgency. Negative for vaginal bleeding, vaginal discharge and vaginal pain.  All other systems reviewed and are negative.    Physical Exam Updated Vital Signs BP (!) 154/82   Pulse 70   Temp 99 F (37.2 C) (Oral)   Resp 18   SpO2 98%   Physical Exam Vitals signs and nursing note reviewed.  Constitutional:      General: She is not in acute distress.    Appearance: She is well-developed.  HENT:     Head: Normocephalic and atraumatic.  Eyes:     General:        Right eye: No discharge.        Left eye: No discharge.     Conjunctiva/sclera: Conjunctivae normal.  Neck:     Vascular: No JVD.     Trachea: No tracheal deviation.  Cardiovascular:     Rate  and Rhythm: Normal rate and regular rhythm.  Pulmonary:     Effort: Pulmonary effort is normal.     Breath sounds: Normal breath sounds.  Abdominal:     General: A surgical scar is present. Bowel sounds are normal. There is no distension.     Palpations: Abdomen is soft.     Tenderness: There is abdominal tenderness in the suprapubic area. There is no right CVA tenderness, left CVA tenderness, guarding or rebound.  Musculoskeletal:     Comments: No midline lumbar spine tenderness, bilateral lower paralumbar muscle tenderness with no deformity, crepitus, or step-off  Skin:    General: Skin is warm and dry.     Findings: No erythema.  Neurological:     Mental Status: She is alert.  Psychiatric:  Behavior: Behavior normal.      ED Treatments / Results  Labs (all labs ordered are listed, but only abnormal results are displayed) Labs Reviewed  COMPREHENSIVE METABOLIC PANEL - Abnormal; Notable for the following components:      Result Value   CO2 21 (*)    All other components within normal limits  URINALYSIS, ROUTINE W REFLEX MICROSCOPIC - Abnormal; Notable for the following components:   APPearance CLOUDY (*)    Hgb urine dipstick LARGE (*)    Protein, ur 100 (*)    RBC / HPF >50 (*)    Bacteria, UA MANY (*)    All other components within normal limits  URINE CULTURE  LIPASE, BLOOD  CBC    EKG None  Radiology Ct Renal Stone Study  Result Date: 03/04/2019 CLINICAL DATA:  Lower abdominal pain that radiates to the back. EXAM: CT ABDOMEN AND PELVIS WITHOUT CONTRAST TECHNIQUE: Multidetector CT imaging of the abdomen and pelvis was performed following the standard protocol without IV contrast. COMPARISON:  November 25, 2017 FINDINGS: Lower chest: No acute abnormality. Hepatobiliary: No focal liver abnormality is seen. Status post cholecystectomy. No biliary dilatation. Pancreas: Unremarkable. No pancreatic ductal dilatation or surrounding inflammatory changes. Spleen: Normal in  size without focal abnormality. Adrenals/Urinary Tract: Adrenal glands are unremarkable. Kidneys are normal, without renal calculi, focal lesion, or hydronephrosis. Bladder is unremarkable. Two adjacent calculi within the distal left ureter measure 6 and 3 mm. Stomach/Bowel: Stomach is within normal limits. Appendix appears normal. No evidence of bowel wall thickening, distention, or inflammatory changes. Left colonic diverticulosis without evidence of diverticulitis. Vascular/Lymphatic: No significant vascular findings are present. No enlarged abdominal or pelvic lymph nodes. Reproductive: Status post hysterectomy. No adnexal masses. Other: Small area of subcutaneous fat stranding within the anterior abdominal wall superior to the umbilicus, likely postsurgical scarring. Musculoskeletal: Mild spondylosis of the lower lumbosacral spine. 8 mm sclerotic focus within the left ilium, stable from 2019. 8 mm sclerotic focus within the right acetabulum, stable from 2019. IMPRESSION: 1. Two adjacent calculi within the distal left ureter measure 6 and 3 mm, approximately 5 cm upstream to the vesicoureteral junction. However, there is no evidence of obstructive uropathy. 2. No evidence of acute abnormalities within the solid abdominal organs. 3. Left colonic diverticulosis without evidence of diverticulitis. Electronically Signed   By: Fidela Salisbury M.D.   On: 03/04/2019 15:05    Procedures Procedures (including critical care time)  Medications Ordered in ED Medications  cefTRIAXone (ROCEPHIN) 1 g in sodium chloride 0.9 % 100 mL IVPB (1 g Intravenous New Bag/Given 03/04/19 1623)  sodium chloride flush (NS) 0.9 % injection 3 mL (3 mLs Intravenous Given 03/04/19 1342)  morphine 4 MG/ML injection 4 mg (4 mg Intravenous Given 03/04/19 1336)  ondansetron (ZOFRAN) injection 4 mg (4 mg Intravenous Given 03/04/19 1336)  sodium chloride 0.9 % bolus 1,000 mL (0 mLs Intravenous Stopped 03/04/19 1529)     Initial Impression /  Assessment and Plan / ED Course  I have reviewed the triage vital signs and the nursing notes.  Pertinent labs & imaging results that were available during my care of the patient were reviewed by me and considered in my medical decision making (see chart for details).        Patient with suprapubic pain, bilateral flank pain, urinary symptoms for 1 week.  She is afebrile, hypertensive in the ED with some improvement with pain control.  Otherwise vital signs are stable and she is nontoxic in appearance.  No peritoneal signs on examination of the abdomen.  Lab work reviewed by me shows no leukocytosis, no anemia, no metabolic derangements, no renal insufficiency.  Her UA is concerning for UTI versus nephrolithiasis with many bacteria, some WBCs, and greater than 50 RBCs.  Renal stone study shows 2 adjacent calculi within the distal left ureter measuring 6 and 3 mm approximately 5 cm upstream to the UVJ, no evidence of hydronephrosis or pyelonephritis.  No evidence of acute surgical abdominal pathology including obstruction, perforation, appendicitis, or cholecystitis.  Pain was controlled in the ED and on reevaluation she is resting comfortably no apparent distress, tolerating p.o. food and fluids without difficulty and serial abdominal examinations remain benign.  CONSULT: Spoke with Dr. Junious Silk with urology who recommends starting the patient on antibiotics for her questionable urine as well as sending home with Flomax.  Will also obtain a urine culture.  He recommends the patient call the office tomorrow to set up an outpatient visit sometime next week.  Urology recommendations have been relayed to the patient.  She was given IV Rocephin in the ED.  Has tolerated Keflex in the past despite allergy to penicillin.  Discussed side effects of Flomax.  Will discharge with small amount of hydrocodone for severe breakthrough pain, advised of side effects and appropriate use.  Discussed strict ED return  precautions. Patient verbalized understanding of and agreement with plan and is safe for discharge home at this time.   Final Clinical Impressions(s) / ED Diagnoses   Final diagnoses:  Left ureteral calculus  Acute cystitis with hematuria    ED Discharge Orders         Ordered    HYDROcodone-acetaminophen (NORCO/VICODIN) 5-325 MG tablet  Every 6 hours PRN     03/04/19 1614    tamsulosin (FLOMAX) 0.4 MG CAPS capsule  Daily after supper     03/04/19 1614    cephALEXin (KEFLEX) 500 MG capsule  4 times daily     03/04/19 [address], [place] A, PA-C 03/04/19 1639    Malvin Johns, MD 03/04/19 1717

## 2019-03-05 LAB — URINE CULTURE: Culture: NO GROWTH

## 2019-03-10 ENCOUNTER — Other Ambulatory Visit: Payer: Self-pay

## 2019-03-10 ENCOUNTER — Emergency Department (HOSPITAL_BASED_OUTPATIENT_CLINIC_OR_DEPARTMENT_OTHER)
Admission: EM | Admit: 2019-03-10 | Discharge: 2019-03-10 | Disposition: A | Discharge status: Home / Self Care | Payer: No Typology Code available for payment source | Attending: Emergency Medicine | Admitting: Emergency Medicine

## 2019-03-10 ENCOUNTER — Encounter (HOSPITAL_BASED_OUTPATIENT_CLINIC_OR_DEPARTMENT_OTHER): Payer: Self-pay | Admitting: Emergency Medicine

## 2019-03-10 DIAGNOSIS — Z791 Long term (current) use of non-steroidal anti-inflammatories (NSAID): Secondary | ICD-10-CM | POA: Insufficient documentation

## 2019-03-10 DIAGNOSIS — R109 Unspecified abdominal pain: Secondary | ICD-10-CM | POA: Diagnosis present

## 2019-03-10 DIAGNOSIS — R112 Nausea with vomiting, unspecified: Secondary | ICD-10-CM | POA: Diagnosis not present

## 2019-03-10 DIAGNOSIS — N200 Calculus of kidney: Secondary | ICD-10-CM | POA: Diagnosis not present

## 2019-03-10 DIAGNOSIS — Z79899 Other long term (current) drug therapy: Secondary | ICD-10-CM | POA: Diagnosis not present

## 2019-03-10 LAB — BASIC METABOLIC PANEL
Anion gap: 11 (ref 5–15)
BUN: 21 mg/dL — ABNORMAL HIGH (ref 6–20)
CO2: 23 mmol/L (ref 22–32)
Calcium: 9 mg/dL (ref 8.9–10.3)
Chloride: 104 mmol/L (ref 98–111)
Creatinine, Ser: 0.86 mg/dL (ref 0.44–1.00)
GFR calc Af Amer: >60 mL/min (ref >60)
GFR calc non Af Amer: >60 mL/min (ref >60)
Glucose, Bld: 111 mg/dL — ABNORMAL HIGH (ref 70–99)
Potassium: 3.5 mmol/L (ref 3.5–5.1)
Sodium: 138 mmol/L (ref 135–145)

## 2019-03-10 LAB — CBC WITH DIFFERENTIAL/PLATELET
Abs Immature Granulocytes: 0.03 10*3/uL (ref 0.00–0.07)
Basophils Absolute: 0 10*3/uL (ref 0.0–0.1)
Basophils Relative: 1 %
Eosinophils Absolute: 0 10*3/uL (ref 0.0–0.5)
Eosinophils Relative: 1 %
HCT: 42.8 % (ref 36.0–46.0)
Hemoglobin: 14.1 g/dL (ref 12.0–15.0)
Immature Granulocytes: 0 %
Lymphocytes Relative: 18 %
Lymphs Abs: 1.6 10*3/uL (ref 0.7–4.0)
MCH: 29.9 pg (ref 26.0–34.0)
MCHC: 32.9 g/dL (ref 30.0–36.0)
MCV: 90.9 fL (ref 80.0–100.0)
Monocytes Absolute: 0.7 10*3/uL (ref 0.1–1.0)
Monocytes Relative: 8 %
Neutro Abs: 6.3 10*3/uL (ref 1.7–7.7)
Neutrophils Relative %: 72 %
Platelets: 301 10*3/uL (ref 150–400)
RBC: 4.71 MIL/uL (ref 3.87–5.11)
RDW: 12.4 % (ref 11.5–15.5)
WBC: 8.7 10*3/uL (ref 4.0–10.5)
nRBC: 0 % (ref 0.0–0.2)

## 2019-03-10 LAB — URINALYSIS, ROUTINE W REFLEX MICROSCOPIC
Bilirubin Urine: NEGATIVE
Glucose, UA: NEGATIVE mg/dL
Ketones, ur: NEGATIVE mg/dL
Nitrite: NEGATIVE
Protein, ur: 30 mg/dL — AB
Specific Gravity, Urine: >1.03 — ABNORMAL HIGH (ref 1.005–1.030)
pH: 6 (ref 5.0–8.0)

## 2019-03-10 LAB — URINALYSIS, MICROSCOPIC (REFLEX)

## 2019-03-10 MED ORDER — ONDANSETRON HCL 4 MG/2ML IJ SOLN
4.0000 mg | Freq: Once | INTRAMUSCULAR | Status: AC
  Administered 2019-03-10: 4 mg via INTRAVENOUS
  Filled 2019-03-10: qty 2

## 2019-03-10 MED ORDER — ONDANSETRON 4 MG PO TBDP: 4.0000 mg | ORAL_TABLET | Freq: Four times a day (QID) | ORAL | 0 refills | Status: DC | PRN

## 2019-03-10 MED ORDER — HYDROMORPHONE HCL 1 MG/ML IJ SOLN
1.0000 mg | Freq: Once | INTRAMUSCULAR | Status: AC
  Administered 2019-03-10: 1 mg via INTRAVENOUS
  Filled 2019-03-10: qty 1

## 2019-03-10 MED ORDER — SODIUM CHLORIDE 0.9 % IV BOLUS (SEPSIS)
1000.0000 mL | Freq: Once | INTRAVENOUS | Status: AC
  Administered 2019-03-10: 1000 mL via INTRAVENOUS

## 2019-03-10 MED ORDER — OXYCODONE-ACETAMINOPHEN 5-325 MG PO TABS: 2.0000 | ORAL_TABLET | Freq: Four times a day (QID) | ORAL | 0 refills | Status: DC | PRN

## 2019-03-10 NOTE — ED Provider Notes (Signed)
TIME SEEN: 5:11 AM  CHIEF COMPLAINT: Left flank pain, nausea and vomiting  HPI: Patient is a 53 year old female with previous history of nephrolithiasis who presents to the emergency department for left flank pain and suprapubic pain.  Was seen in the emergency department on 03/04/2019 and diagnosed with two stones in the left ureter.  One was 6 mm and one was 3 mm.  She states that she did pass 1 stent at home.  She continues to have pain that worsened in nights despite Vicodin.  Has had nausea, vomiting.  No fevers.  Feels like she is unable to urinate.  No dysuria or hematuria.  She is status post hysterectomy.  Patient was discharged with Vicodin, Keflex and Flomax.  ROS: See HPI Constitutional: no fever  Eyes: no drainage  ENT: no runny nose   Cardiovascular:  no chest pain  Resp: no SOB  GI:  vomiting GU: no dysuria Integumentary: no rash  Allergy: no hives  Musculoskeletal: no leg swelling  Neurological: no slurred speech ROS otherwise negative  PAST MEDICAL HISTORY/PAST SURGICAL HISTORY:  Past Medical History:  Diagnosis Date  . Arthritis   . Carpal tunnel syndrome of right wrist   . Closed displaced trimalleolar fracture of right ankle 11/20/2018  . GERD (gastroesophageal reflux disease)   . Psoriasis     MEDICATIONS:  Prior to Admission medications   Medication Sig Start Date End Date Taking? Authorizing Provider  cephALEXin (KEFLEX) 500 MG capsule Take 1 capsule (500 mg total) by mouth 4 (four) times daily for 7 days. 03/04/19 03/11/19  Fawze, Mina A, PA-C  Cyanocobalamin (VITAMIN B-12 PO) Take 1 tablet by mouth daily.    [provider]  Diclofenac Sodium (PENNSAID) 2 % SOLN Place 1 application onto the skin 2 (two) times daily. 02/11/19   Rosemarie Ax, MD  HYDROcodone-acetaminophen (NORCO/VICODIN) 5-325 MG tablet Take 1 tablet by mouth every 6 (six) hours as needed for severe pain. 03/04/19   Fawze, Mina A, PA-C  Multiple Vitamins-Minerals (MULTI ADULT GUMMIES  PO) Take 1 tablet by mouth daily.    [provider]  naproxen sodium (ALEVE) 220 MG tablet Take 220 mg by mouth.    [provider]  omeprazole (PRILOSEC) 20 MG capsule Take 20 mg by mouth daily.    [provider]  sennosides-docusate sodium (SENOKOT-S) 8.6-50 MG tablet Take 2 tablets by mouth daily. 11/19/18   Marchia Bond, MD  tamsulosin (FLOMAX) 0.4 MG CAPS capsule Take 1 capsule (0.4 mg total) by mouth daily after supper. 03/04/19   Nils Flack, Mina A, PA-C  triamcinolone ointment (KENALOG) 0.5 % Apply a thin coat to poison vine rash twice daily. Not for face or genital areas. 12/14/18   Libby Maw, MD  VITAMIN D PO Take 1 tablet by mouth daily.    [provider]    ALLERGIES:  Allergies  Allergen Reactions  . Latex Other (See Comments)    Tears skin and break out  . Sulfa Antibiotics Itching and Nausea And Vomiting    Skin becomes red and feels like it is burning  . Sulfasalazine Hives, Itching and Nausea And Vomiting    Skin becomes red and feels like it is burning Skin becomes red and feels like it is burning  . Corticosteroids Hives, Itching and Rash    fever  . Hydrocodone Nausea And Vomiting  . Nitrofurantoin Monohyd Macro Nausea And Vomiting  . Penicillins Hives, Itching and Rash    Has patient had a  PCN reaction causing immediate rash, facial/tongue/throat swelling, SOB or lightheadedness with hypotension: No Has patient had a PCN reaction causing severe rash involving mucus membranes or skin necrosis: No Has patient had a PCN reaction that required hospitalization: No Has patient had a PCN reaction occurring within the last 10 years: No If all of the above answers are "NO", then may proceed with Cephalosporin use.   . Prednisone Hives, Itching and Other (See Comments)    fever  . Propofol Hives, Rash and Other (See Comments)    Fever and hair loss  . Tramadol Nausea Only    SOCIAL HISTORY:  Social History   Tobacco Use   . Smoking status: Never Smoker  . Smokeless tobacco: Never Used  Substance Use Topics  . Alcohol use: No    FAMILY HISTORY: Family History  Problem Relation Age of Onset  . Arthritis Mother   . Heart attack Mother   . High blood pressure Mother   . Cancer Father   . Diabetes Father   . Arthritis Brother   . Arthritis Brother   . Arthritis Brother   . Diabetes Son     EXAM: BP (!) 184/101 (BP Location: Right Arm)   Pulse 66   Temp 98.1 F (36.7 C)   Resp 18   SpO2 100%  CONSTITUTIONAL: Alert and oriented and responds appropriately to questions.  Obese, appears uncomfortable, afebrile HEAD: Normocephalic EYES: Conjunctivae clear, pupils appear equal, EOMI ENT: normal nose; moist mucous membranes NECK: Supple, no meningismus, no nuchal rigidity, no LAD  CARD: RRR; S1 and S2 appreciated; no murmurs, no clicks, no rubs, no gallops RESP: Normal chest excursion without splinting or tachypnea; breath sounds clear and equal bilaterally; no wheezes, no rhonchi, no rales, no hypoxia or respiratory distress, speaking full sentences ABD/GI: Normal bowel sounds; non-distended; soft, mildly tender in the suprapubic region, no rebound, no guarding, no peritoneal signs, no hepatosplenomegaly BACK:  The back appears normal and is non-tender to palpation, there is left CVA tenderness EXT: Normal ROM in all joints; non-tender to palpation; no edema; normal capillary refill; no cyanosis, no calf tenderness or swelling    SKIN: Normal color for age and race; warm; no rash NEURO: Moves all extremities equally PSYCH: The patient's mood and manner are appropriate. Grooming and personal hygiene are appropriate.  MEDICAL DECISION MAKING: Patient here with complaints of left flank pain.  Has known kidney stone in this area.  Will repeat labs, urine today.  Will treat symptoms with IV fluids, Dilaudid, Zofran.  Patient has not followed up with urology.  I do not feel she needs repeat imaging  today.  ED PROGRESS: Patient's labs reassuring.  Urine shows small leukocytes, 11-20 red blood cells, 11-20 white blood cells, many bacteria but also many squamous cells.  She has no leukocytosis, fever.  I do not think that this is infected urine.  A culture is pending.  She states she is feeling better but requesting second dose of pain medication.  We will also p.o. challenge patient.   7:00 AM  Pt reports feeling much better.  States she is out of Vicodin.  Will re-prescribe this medication as well as Zofran.  She will follow-up with urology today and schedule an appointment.  She is tolerating p.o. without difficulty.  Discussed return precautions.   At this time, I do not feel there is any life-threatening condition present. I have reviewed and discussed all results (EKG, imaging, lab, urine as appropriate) and exam findings with  patient/family. I have reviewed nursing notes and appropriate previous records.  I feel the patient is safe to be discharged home without further emergent workup and can continue workup as an outpatient as needed. Discussed usual and customary return precautions. Patient/family verbalize understanding and are comfortable with this plan.  Outpatient follow-up has been provided as needed. All questions have been answered.     Dorian Duval, Delice Bison, DO 03/10/19 [phone]

## 2019-03-10 NOTE — ED Triage Notes (Signed)
L sided flank pain, diagnosed with kidney stones at [facility] ED on 8/6. Reports she's passed one of those stones. Reports increased pain and vomiting this morning. Took two vicodins and reports no relief.

## 2019-03-11 LAB — URINE CULTURE: Culture: NO GROWTH

## 2019-03-15 ENCOUNTER — Ambulatory Visit: Payer: No Typology Code available for payment source | Admitting: Family Medicine

## 2019-03-30 ENCOUNTER — Inpatient Hospital Stay (HOSPITAL_COMMUNITY): Admission: RE | Admit: 2019-03-30 | Payer: No Typology Code available for payment source | Source: Ambulatory Visit

## 2019-03-31 ENCOUNTER — Inpatient Hospital Stay (HOSPITAL_COMMUNITY): Admission: RE | Admit: 2019-03-31 | Payer: No Typology Code available for payment source | Source: Ambulatory Visit

## 2019-04-01 ENCOUNTER — Encounter (HOSPITAL_COMMUNITY): Admission: RE | Admit: 2019-04-01 | Payer: No Typology Code available for payment source | Source: Ambulatory Visit

## 2019-04-02 ENCOUNTER — Ambulatory Visit (HOSPITAL_COMMUNITY)
Admission: RE | Admit: 2019-04-02 | Payer: No Typology Code available for payment source | Source: Home / Self Care | Admitting: Urology

## 2019-04-02 ENCOUNTER — Encounter (HOSPITAL_COMMUNITY): Admission: RE | Payer: Self-pay | Source: Home / Self Care

## 2019-04-02 SURGERY — CYSTOSCOPY/URETEROSCOPY/HOLMIUM LASER/STENT PLACEMENT
Anesthesia: General | Laterality: Left

## 2019-04-06 ENCOUNTER — Telehealth: Payer: Self-pay

## 2019-04-06 NOTE — Telephone Encounter (Signed)
Okay 

## 2019-04-06 NOTE — Telephone Encounter (Signed)
I left pt a voicemail with the testing information & address of the testing site. I advised her to call back with any additional questions.

## 2019-04-06 NOTE — Telephone Encounter (Signed)
Pt was exposed to someone who has the coronavirus at a gender reveal party over the weekend. Okay to send to get tested?

## 2019-05-24 ENCOUNTER — Encounter: Payer: Self-pay | Admitting: Family Medicine

## 2019-05-24 NOTE — Telephone Encounter (Signed)
I would recommend that she increase fiber and fluid intake initially. Sitz bath and preparation-H with lidocaine may also be helpful.  If not improving with this please have her come in for visit.

## 2019-06-07 ENCOUNTER — Ambulatory Visit (INDEPENDENT_AMBULATORY_CARE_PROVIDER_SITE_OTHER): Payer: No Typology Code available for payment source | Admitting: Family Medicine

## 2019-06-07 ENCOUNTER — Other Ambulatory Visit: Payer: Self-pay

## 2019-06-07 ENCOUNTER — Encounter: Payer: Self-pay | Admitting: Family Medicine

## 2019-06-07 VITALS — BP 128/80 | HR 87 | Ht 68.0 in | Wt 249.0 lb

## 2019-06-07 DIAGNOSIS — R319 Hematuria, unspecified: Secondary | ICD-10-CM | POA: Diagnosis not present

## 2019-06-07 DIAGNOSIS — R102 Pelvic and perineal pain: Secondary | ICD-10-CM

## 2019-06-07 DIAGNOSIS — K602 Anal fissure, unspecified: Secondary | ICD-10-CM | POA: Diagnosis not present

## 2019-06-07 LAB — POCT URINALYSIS DIPSTICK
Glucose, UA: NEGATIVE
Ketones, UA: NEGATIVE
Leukocytes, UA: NEGATIVE
Nitrite, UA: NEGATIVE
Protein, UA: POSITIVE — AB
Urobilinogen, UA: 1 E.U./dL
pH, UA: 5.5 (ref 5.0–8.0)

## 2019-06-07 MED ORDER — DILTIAZEM GEL 2 %: CUTANEOUS | 0 refills | Status: DC

## 2019-06-07 MED ORDER — NITROGLYCERIN 0.4 % RE OINT: TOPICAL_OINTMENT | RECTAL | 1 refills | Status: DC

## 2019-06-07 NOTE — Patient Instructions (Signed)
Anal Fissure, Adult  An anal fissure is a small tear or crack in the tissue of the anus. Bleeding from a fissure usually stops on its own within a few minutes. However, bleeding will often occur again with each bowel movement until the fissure heals. What are the causes? This condition is usually caused by passing a large or hard stool (feces). Other causes include:  Constipation.  Frequent diarrhea.  Inflammatory bowel disease (Crohn's disease or ulcerative colitis).  Childbirth.  Infections.  Anal sex. What are the signs or symptoms? Symptoms of this condition include:  Bleeding from the rectum.  Small amounts of blood seen on your stool, on the toilet paper, or in the toilet after a bowel movement. The blood coats the outside of the stool and is not mixed with the stool.  Painful bowel movements.  Itching or irritation around the anus. How is this diagnosed? A health care provider may diagnose this condition by closely examining the anal area. An anal fissure can usually be seen with careful inspection. In some cases, a rectal exam may be performed, or a short tube (anoscope) may be used to examine the anal canal. How is this treated? Initial treatment for this condition may include:  Taking steps to avoid constipation. This may include making changes to your diet, such as increasing your intake of fiber or fluid.  Taking fiber supplements. These supplements can soften your stool to help make bowel movements easier. Your health care provider may also prescribe a stool softener if your stool is hard.  Taking sitz baths. This may help to heal the tear.  Using medicated creams or ointments. These may be prescribed to lessen discomfort. Treatments that are sometimes used if initial treatments do not work well or if the condition is more severe may include:  Botulinum injection.  Surgery to repair the fissure. Follow these instructions at home: Eating and drinking    Avoid foods that may cause constipation, such as bananas, milk, and other dairy products.  Eat all fruits, except bananas.  Drink enough fluid to keep your urine pale yellow.  Eat foods that are high in fiber, such as beans, whole grains, and fresh fruits and vegetables. General instructions   Take over-the-counter and prescription medicines only as told by your health care provider.  Use creams or ointments only as told by your health care provider.  Keep the anal area clean and dry.  Take sitz baths as told by your health care provider. Do not use soap in the sitz baths.  Keep all follow-up visits as told by your health care provider. This is important. Contact a health care provider if you have:  More bleeding.  A fever.  Diarrhea that is mixed with blood.  Pain that continues.  Ongoing problems that are getting worse rather than better. Summary  An anal fissure is a small tear or crack in the tissue of the anus. This condition is usually caused by passing a large or hard stool (feces). Other causes include constipation and frequent diarrhea.  Initial treatment for this condition may include taking steps to avoid constipation, such as increasing your intake of fiber or fluid.  Follow instructions for care as told by your health care provider.  Contact your health care provider if you have more bleeding or your problem is getting worse rather than better.  Keep all follow-up visits as told by your health care provider. This is important. This information is not intended to replace advice given   to you by your health care provider. Make sure you discuss any questions you have with your health care provider. Document Released: 07/15/2005 Document Revised: 12/25/2017 Document Reviewed: 12/25/2017 Elsevier Patient Education  2020 Elsevier Inc.  

## 2019-06-07 NOTE — Progress Notes (Signed)
Established Patient Office Visit  Subjective:  Patient ID: Dawn Mcguire, female    DOB: 03-May-1966  Age: 53 y.o. MRN: [id number]  CC:  Chief Complaint  Patient presents with  . Anal Fissure    HPI Semiah Sistare presents for 2-week history of significant rectal pain with stooling and passing gas.  Past medical history of fourth degree tear with  vaginal delivery.  She has ongoing seepage.  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  She has been straining to move furniture around during the yard cell and her symptoms started after that time.  She has been treating with stool softeners and Preparation H.  She has not had a colonoscopy yet.  She is experiencing suprapubic pressure without dysuria frequency urgency.  She does have a history of kidney stones.  Past Medical History:  Diagnosis Date  . Arthritis   . Carpal tunnel syndrome of right wrist   . Closed displaced trimalleolar fracture of right ankle 11/20/2018  . GERD (gastroesophageal reflux disease)   . Psoriasis     Past Surgical History:  Procedure Laterality Date  . ABDOMINAL HYSTERECTOMY    . BLADDER REPAIR    . BREAST SURGERY    . CARPAL TUNNEL RELEASE     on the right wrist  . CHOLECYSTECTOMY    . INSERTION OF MESH N/A 10/16/2017   Procedure: INSERTION OF MESH;  Surgeon: Donnie Mesa, MD;  Location: [place];  Service: General;  Laterality: N/A;  . IR US GUIDE BX ASP/DRAIN  12/31/2017  . ORIF ANKLE FRACTURE Right 11/20/2018   Procedure: OPEN REDUCTION INTERNAL FIXATION (ORIF) ANKLE FRACTURE AND SYNDESMOSIS;  Surgeon: Marchia Bond, MD;  Location: [facility];  Service: Orthopedics;  Laterality: Right;  . VENTRAL HERNIA REPAIR N/A 10/16/2017   Procedure: OPEN VENTRAL HERNIA REPAIR WITH MESH;  Surgeon: Donnie Mesa, MD;  Location: [facility];  Service: General;  Laterality: N/A;    Family History  Problem Relation Age of Onset  . Arthritis Mother    . Heart attack Mother   . High blood pressure Mother   . Cancer Father   . Diabetes Father   . Arthritis Brother   . Arthritis Brother   . Arthritis Brother   . Diabetes Son     Social History   Socioeconomic History  . Marital status: Married    Spouse name: Not on file  . Number of children: Not on file  . Years of education: Not on file  . Highest education level: Not on file  Occupational History  . Not on file  Social Needs  . Financial resource strain: Not on file  . Food insecurity    Worry: Not on file    Inability: Not on file  . Transportation needs    Medical: Not on file    Non-medical: Not on file  Tobacco Use  . Smoking status: Never Smoker  . Smokeless tobacco: Never Used  Substance and Sexual Activity  . Alcohol use: No  . Drug use: No  . Sexual activity: Yes    Birth control/protection: Surgical  Lifestyle  . Physical activity    Days per week: Not on file    Minutes per session: Not on file  . Stress: Not on file  Relationships  . Social Herbalist on phone: Not on file    Gets together: Not on file    Attends religious service:  Not on file    Active member of club or organization: Not on file    Attends meetings of clubs or organizations: Not on file    Relationship status: Not on file  . Intimate partner violence    Fear of current or ex partner: Not on file    Emotionally abused: Not on file    Physically abused: Not on file    Forced sexual activity: Not on file  Other Topics Concern  . Not on file  Social History Narrative  . Not on file    Outpatient Medications Prior to Visit  Medication Sig Dispense Refill  . ALPRAZolam (XANAX) 0.5 MG tablet Take 0.5 mg by mouth daily as needed for anxiety.    . APPLE CIDER VINEGAR PO Take 2 capsules by mouth daily.    . cholecalciferol (VITAMIN D3) 25 MCG (1000 UT) tablet Take 2,000 Units by mouth daily.    . Cyanocobalamin (B-12) 2500 MCG TABS Take 2,500 mcg by mouth daily.    .  fluticasone (FLONASE) 50 MCG/ACT nasal spray Place 1 spray into both nostrils daily as needed for allergies or rhinitis.    . naproxen sodium (ALEVE) 220 MG tablet Take 440 mg by mouth 2 (two) times daily.     Marland Kitchen omeprazole (PRILOSEC) 20 MG capsule Take 20 mg by mouth daily.    . tamsulosin (FLOMAX) 0.4 MG CAPS capsule Take 1 capsule (0.4 mg total) by mouth daily after supper. 10 capsule 0  . triamcinolone ointment (KENALOG) 0.5 % Apply a thin coat to poison vine rash twice daily. Not for face or genital areas. (Patient taking differently: Apply 1 application topically 2 (two) times daily as needed (poison oak). Not for face or genital areas.) 45 g 0  . sennosides-docusate sodium (SENOKOT-S) 8.6-50 MG tablet Take 2 tablets by mouth daily. 30 tablet 1  . ciprofloxacin (CIPRO) 500 MG tablet Take 500 mg by mouth 2 (two) times daily.    . Diclofenac Sodium (PENNSAID) 2 % SOLN Place 1 application onto the skin 2 (two) times daily. (Patient not taking: Reported on 03/25/2019) 112 g 3  . diclofenac sodium (VOLTAREN) 1 % GEL Apply 1 application topically 4 (four) times daily as needed (pain).    Marland Kitchen HYDROcodone-acetaminophen (NORCO/VICODIN) 5-325 MG tablet Take 1 tablet by mouth every 6 (six) hours as needed for severe pain. (Patient not taking: Reported on 03/25/2019) 10 tablet 0  . ondansetron (ZOFRAN ODT) 4 MG disintegrating tablet Take 1 tablet (4 mg total) by mouth every 6 (six) hours as needed for nausea or vomiting. 20 tablet 0  . oxyCODONE-acetaminophen (PERCOCET/ROXICET) 5-325 MG tablet Take 2 tablets by mouth every 6 (six) hours as needed for severe pain. (Patient taking differently: Take 1 tablet by mouth every 6 (six) hours as needed for severe pain. ) 20 tablet 0   No facility-administered medications prior to visit.     Allergies  Allergen Reactions  . Latex Other (See Comments)    Tears skin and break out  . Sulfa Antibiotics Itching and Nausea And Vomiting    Skin becomes red and feels like  it is burning  . Sulfasalazine Hives, Itching and Nausea And Vomiting    Skin becomes red and feels like it is burning   . Corticosteroids Hives, Itching and Rash    fever  . Nitrofurantoin Monohyd Macro Nausea And Vomiting  . Penicillins Hives, Itching and Rash    Did it involve swelling of the face/tongue/throat, SOB, or low  BP? No Did it involve sudden or severe rash/hives, skin peeling, or any reaction on the inside of your mouth or nose? No Did you need to seek medical attention at a hospital or doctor's office? No When did it last happen?5 + years If all above answers are "NO", may proceed with cephalosporin use.    . Prednisone Hives, Itching and Other (See Comments)    fever  . Tramadol Nausea Only    ROS Review of Systems  Constitutional: Negative.   HENT: Negative.   Respiratory: Negative.   Cardiovascular: Negative.   Gastrointestinal: Positive for abdominal pain (suprapubic) and anal bleeding. Negative for blood in stool, constipation, diarrhea, nausea and vomiting.  Endocrine: Negative for polyphagia and polyuria.  Genitourinary: Negative for difficulty urinating, flank pain, frequency, hematuria and urgency.  Musculoskeletal: Negative for arthralgias and joint swelling.  Allergic/Immunologic: Negative for immunocompromised state.  Neurological: Negative for light-headedness and numbness.  Hematological: Does not bruise/bleed easily.  Psychiatric/Behavioral: Negative.       Objective:    Physical Exam  Constitutional: She is oriented to person, place, and time. She appears well-developed and well-nourished. No distress.  HENT:  Head: Normocephalic and atraumatic.  Right Ear: External ear normal.  Left Ear: External ear normal.  Eyes: Conjunctivae are normal. Right eye exhibits no discharge. Left eye exhibits no discharge. No scleral icterus.  Neck: No JVD present. No tracheal deviation present.  Cardiovascular: Normal rate, regular rhythm and normal  heart sounds.  Pulmonary/Chest: Effort normal and breath sounds normal. No stridor.  Abdominal: Soft. Bowel sounds are normal. She exhibits no distension. There is no abdominal tenderness (mild suprapubic). There is no rebound and no guarding.  Genitourinary:     Neurological: She is alert and oriented to person, place, and time.  Skin: Skin is warm and dry. She is not diaphoretic.  Psychiatric: She has a normal mood and affect. Her behavior is normal.    BP 128/80   Pulse 87   Ht 5\' 8"  (1.727 m)   Wt 249 lb (112.9 kg)   SpO2 98%   BMI 37.86 kg/m  Wt Readings from Last 3 Encounters:  06/07/19 249 lb (112.9 kg)  02/11/19 246 lb (111.6 kg)  11/19/18 249 lb (112.9 kg)   BP Readings from Last 3 Encounters:  06/07/19 128/80  03/10/19 139/86  03/04/19 133/74   Guideline developer:  UpToDate (see UpToDate for funding source) Date Released: June 2014  Health Maintenance Due  Topic Date Due  . Hepatitis C Screening  [birth year]  . HIV Screening  09/05/1978  . TETANUS/TDAP  09/05/1982  . PAP SMEAR-Modifier  09/05/1984  . MAMMOGRAM  09/05/2013  . COLONOSCOPY  09/05/2013  . INFLUENZA VACCINE  02/27/2019    There are no preventive care reminders to display for this patient.  No results found for: TSH Lab Results  Component Value Date   WBC 8.7 03/10/2019   HGB 14.1 03/10/2019   HCT 42.8 03/10/2019   MCV 90.9 03/10/2019   PLT 301 03/10/2019   Lab Results  Component Value Date   NA 138 03/10/2019   K 3.5 03/10/2019   CO2 23 03/10/2019   GLUCOSE 111 (H) 03/10/2019   BUN 21 (H) 03/10/2019   CREATININE 0.86 03/10/2019   BILITOT 1.0 03/04/2019   ALKPHOS 108 03/04/2019   AST 19 03/04/2019   ALT 16 03/04/2019   PROT 7.5 03/04/2019   ALBUMIN 4.3 03/04/2019   CALCIUM 9.0 03/10/2019   ANIONGAP 11 03/10/2019   No  results found for: CHOL No results found for: HDL No results found for: LDLCALC No results found for: TRIG No results found for: CHOLHDL No results found  for: HGBA1C    Assessment & Plan:   Problem List Items Addressed This Visit      Digestive   Anal fissure   Relevant Medications   diltiazem 2 % GEL     Other   Suprapubic pressure   Relevant Orders   Urinalysis, Routine w reflex microscopic   Hematuria - Primary   Relevant Orders   POC Urinalysis Dipstick (Completed)   Urine Culture   Urinalysis, Routine w reflex microscopic      Meds ordered this encounter  Medications  . DISCONTD: Nitroglycerin 0.4 % OINT    Sig: Apply 1/2-1 inch rectally 4 times daily.    Dispense:  30 g    Refill:  1  . DISCONTD: diltiazem 2 % GEL    Sig: Apply a dab to fissure three times daily for 4 weeks.    Dispense:  60 g    Refill:  0  . diltiazem 2 % GEL    Sig: Apply a dab to fissure three times daily for 4 weeks.    Dispense:  60 g    Refill:  0    Follow-up: No follow-ups on file.   Patient will continue annual care with Tucks pads and stool softener.  She will continue Preparation H and use a dab of diltiazem 3 times daily for 4 weeks..  Follow-up in a few weeks if not improving.

## 2019-06-08 LAB — URINALYSIS, ROUTINE W REFLEX MICROSCOPIC
Bilirubin Urine: NEGATIVE
Ketones, ur: NEGATIVE
Nitrite: NEGATIVE
RBC / HPF: NONE SEEN (ref 0–?)
Specific Gravity, Urine: 1.015 (ref 1.000–1.030)
Total Protein, Urine: NEGATIVE
Urine Glucose: NEGATIVE
Urobilinogen, UA: 0.2 (ref 0.0–1.0)
pH: 6.5 (ref 5.0–8.0)

## 2019-06-09 ENCOUNTER — Telehealth: Payer: Self-pay | Admitting: Family Medicine

## 2019-06-09 LAB — URINE CULTURE
MICRO NUMBER:: 1079010
SPECIMEN QUALITY:: ADEQUATE

## 2019-06-09 NOTE — Telephone Encounter (Signed)
I left a detailed voicemail on pt's voicemail letting her know the culture was negative & no antibiotic is needed. Advised her to call back with any questions.

## 2019-06-09 NOTE — Telephone Encounter (Signed)
Copied from [place] [id number]. Topic: General - Inquiry >> Jun 09, 2019 10:42 AM Cecelia Byars wrote: Reason for CRM: Patient called said she saw her results in My chart and would like to know if Dr Ethelene Hal needs to put her on antibiotics ,

## 2019-08-04 ENCOUNTER — Telehealth: Payer: Self-pay | Admitting: Family Medicine

## 2019-08-04 NOTE — Telephone Encounter (Signed)
Patient is calling and wanted to get a T Dap is it ok to schedule. CB is [phone]

## 2019-08-05 NOTE — Telephone Encounter (Signed)
Patient has a nurse visit for TDAP vaccine only on 08/10/19

## 2019-08-05 NOTE — Telephone Encounter (Signed)
Sure

## 2019-08-09 ENCOUNTER — Other Ambulatory Visit: Payer: Self-pay

## 2019-08-10 ENCOUNTER — Ambulatory Visit (INDEPENDENT_AMBULATORY_CARE_PROVIDER_SITE_OTHER): Payer: No Typology Code available for payment source

## 2019-08-10 DIAGNOSIS — Z23 Encounter for immunization: Secondary | ICD-10-CM

## 2019-08-10 NOTE — Progress Notes (Signed)
Per orders of Dr. Ethelene Hal  injection of Tdap given by Verline Lema L Idrissa Beville in left deltoid. Patient tolerated injection well.

## 2019-10-30 ENCOUNTER — Ambulatory Visit: Payer: No Typology Code available for payment source

## 2019-10-30 ENCOUNTER — Ambulatory Visit: Payer: No Typology Code available for payment source | Attending: Internal Medicine

## 2019-10-30 DIAGNOSIS — Z23 Encounter for immunization: Secondary | ICD-10-CM

## 2019-10-30 NOTE — Progress Notes (Signed)
   Covid-19 Vaccination Clinic  Name:  Dawn Mcguire    MRN: [id number] DOB: 04/16/1966  10/30/2019  Dawn Mcguire was observed post Covid-19 immunization for 15 minutes without incident. She was provided with Vaccine Information Sheet and instruction to access the V-Safe system.   Dawn Mcguire was instructed to call 911 with any severe reactions post vaccine: Marland Kitchen Difficulty breathing  . Swelling of face and throat  . A fast heartbeat  . A bad rash all over body  . Dizziness and weakness   Immunizations Administered    Name Date Dose VIS Date Route   Pfizer COVID-19 Vaccine 10/30/2019 11:21 AM 0.3 mL 07/09/2019 Intramuscular   Manufacturer: [place]   Lot: [id number]   [place]: [id number]

## 2019-11-16 IMAGING — CR RIGHT ANKLE - COMPLETE 3+ VIEW
3 series · 3 of 3 positions shown · non-contrast
Comparison: None.

CLINICAL DATA: Twisting injury right ankle today going up stairs.
Initial encounter.

EXAM:
RIGHT ANKLE - COMPLETE 3+ VIEW

[ankle ap]
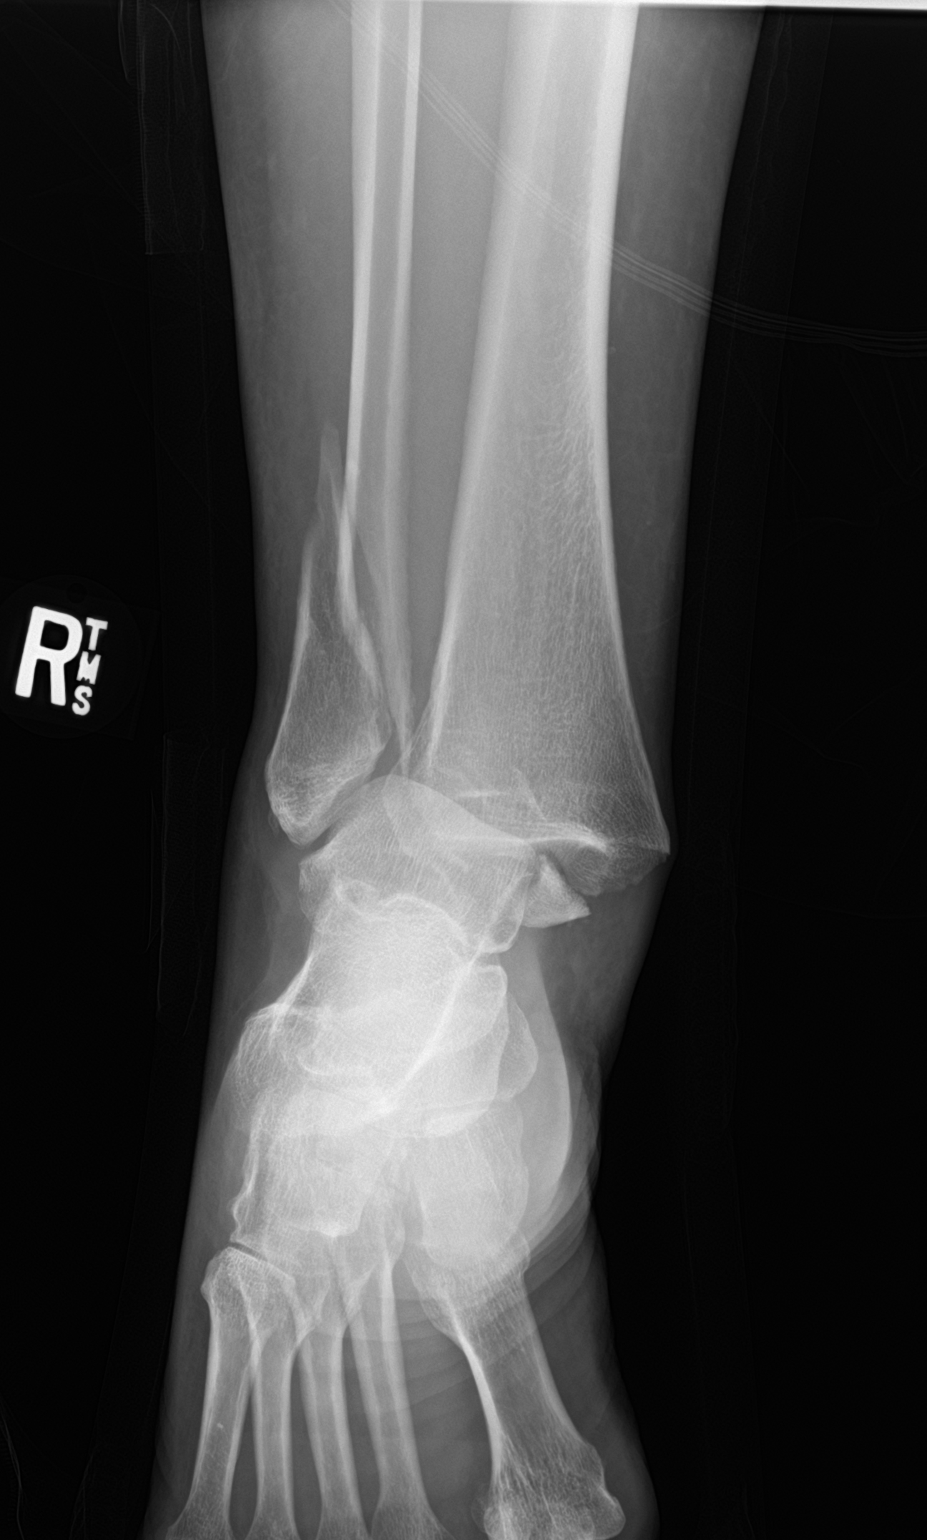

[ankle obl]
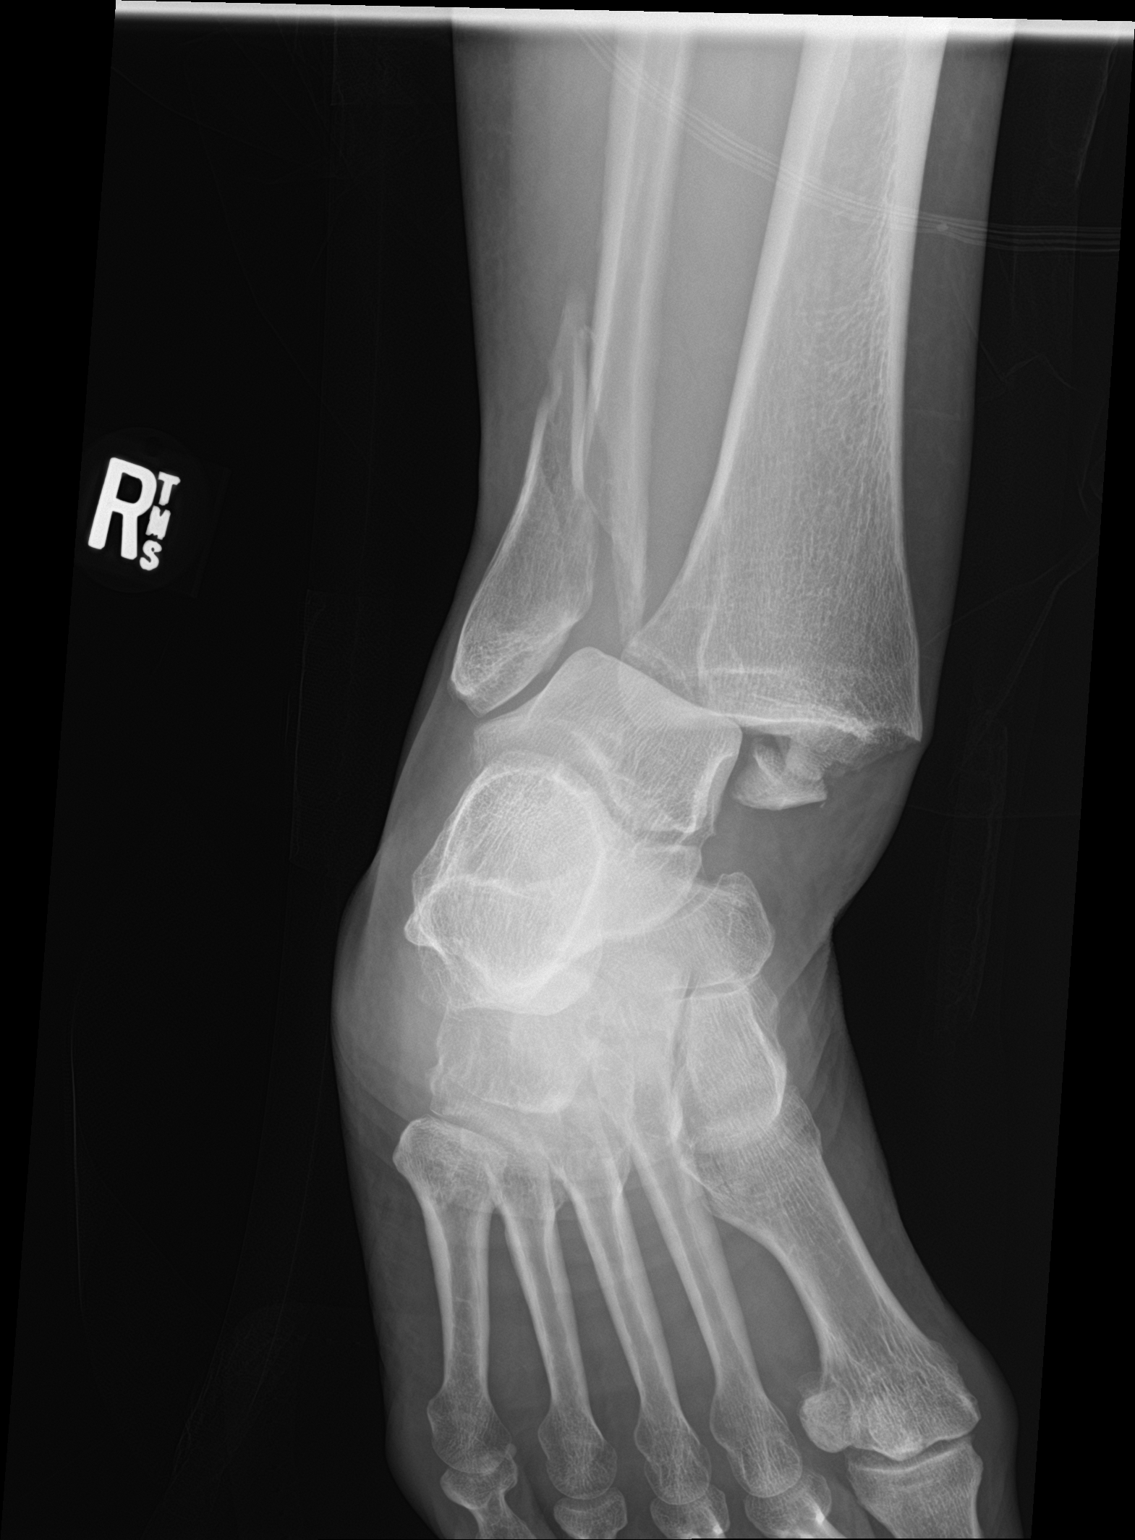

[ankle lat]
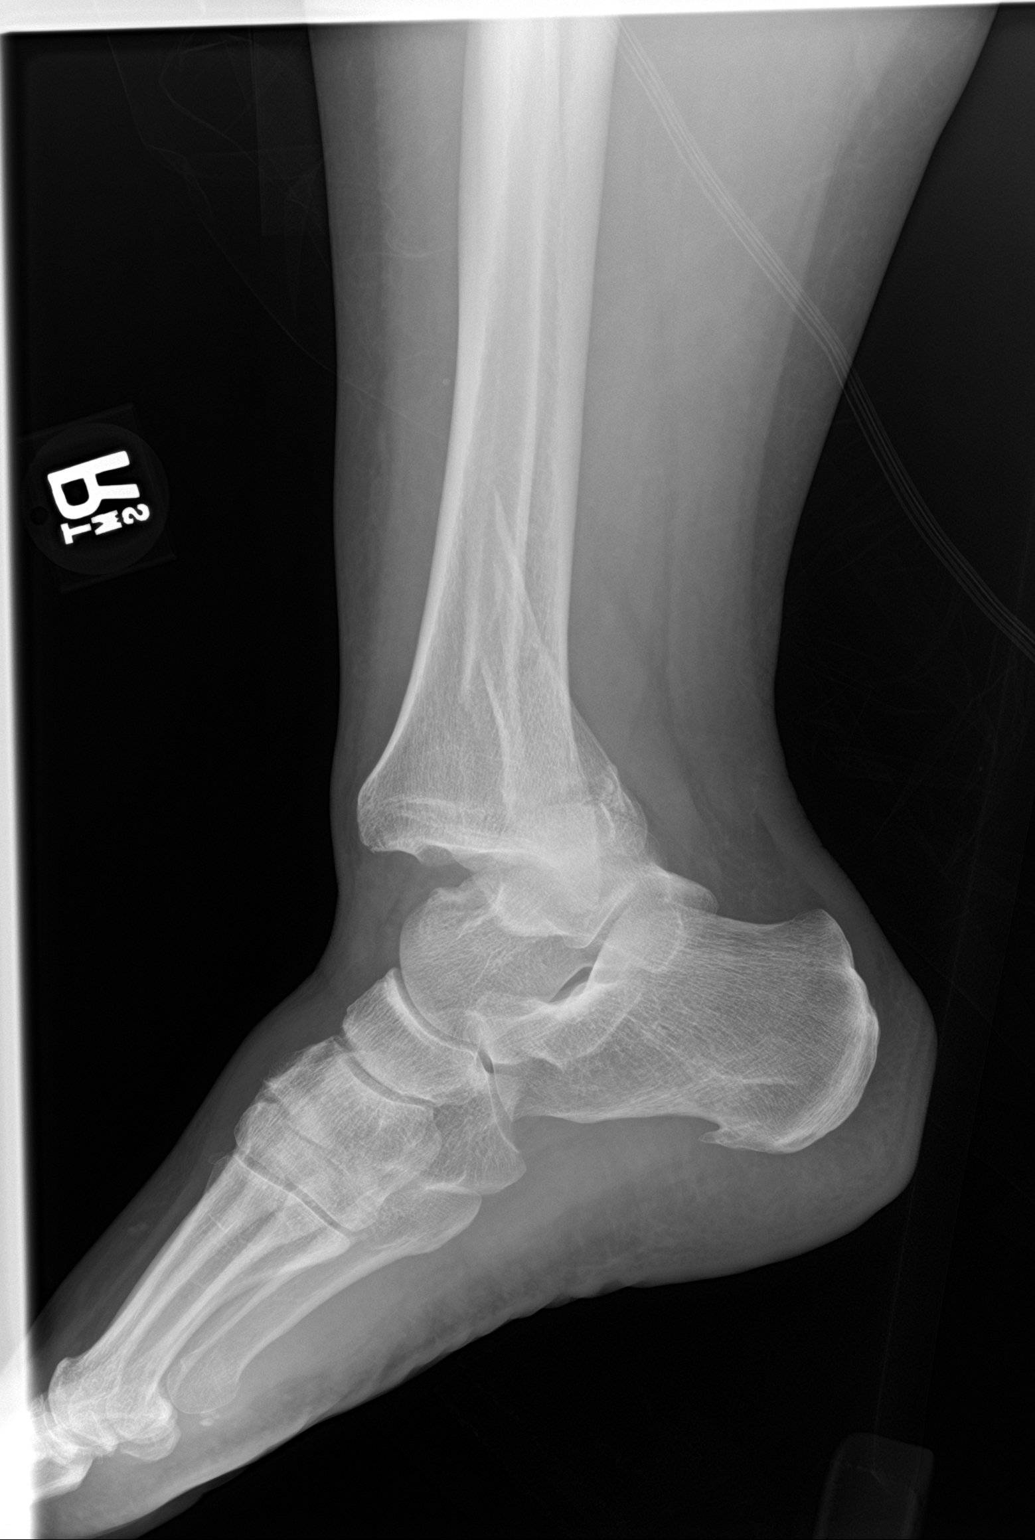

[3 of 3 positions shown; findings below may reference images not displayed]

FINDINGS: The patient has an acute fracture of the distal fibula originating
8.5 cm above the tip of the lateral malleolus. Distal fracture
fragment demonstrates nearly 1 shaft width lateral displacement and
dorsal angulation of approximately 30 degrees. The patient also has
an acute medial malleolar fracture. Marked lateral and posterior
subluxation of the talus out of the tibiotalar joint is identified.
No other bony abnormality is seen. Soft tissues about the ankle are
swollen.
IMPRESSION: Distal fibular and medial malleolar fractures with lateral and
posterior subluxation of the talus out of the tibiotalar joint
consistent with Trond Olav Dversnes injury.

## 2019-11-16 IMAGING — CR RIGHT FOOT COMPLETE - 3+ VIEW
3 series · 3 of 3 positions shown · non-contrast
Comparison: None.

CLINICAL DATA: Twisting injury right ankle today on stairs. Pain.
Initial encounter.

EXAM:
RIGHT FOOT COMPLETE - 3+ VIEW

[foot ap]
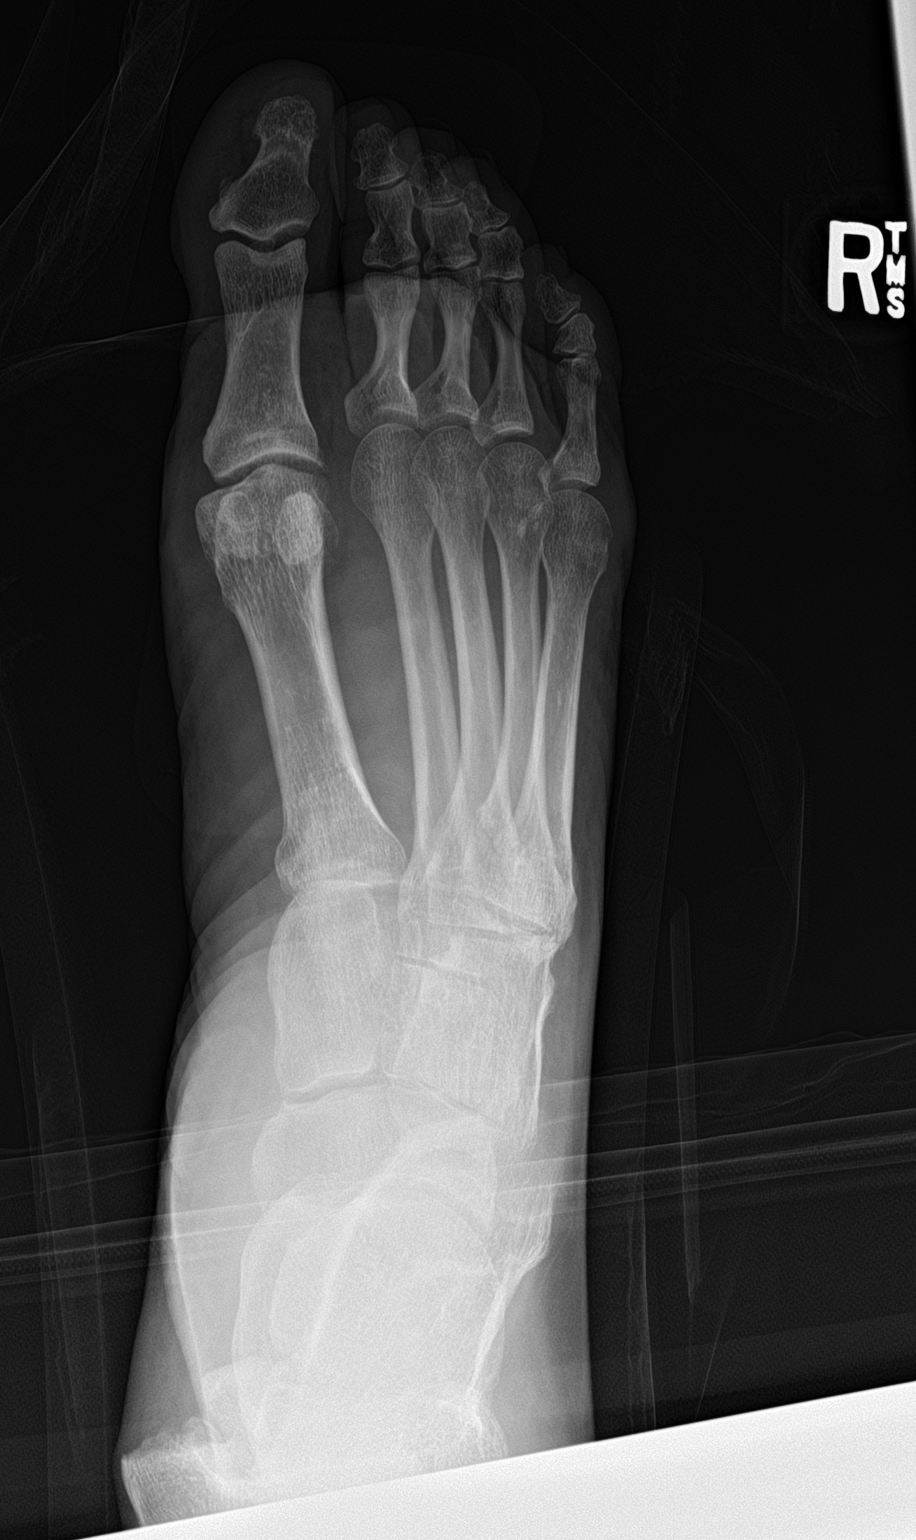

[foot lat]
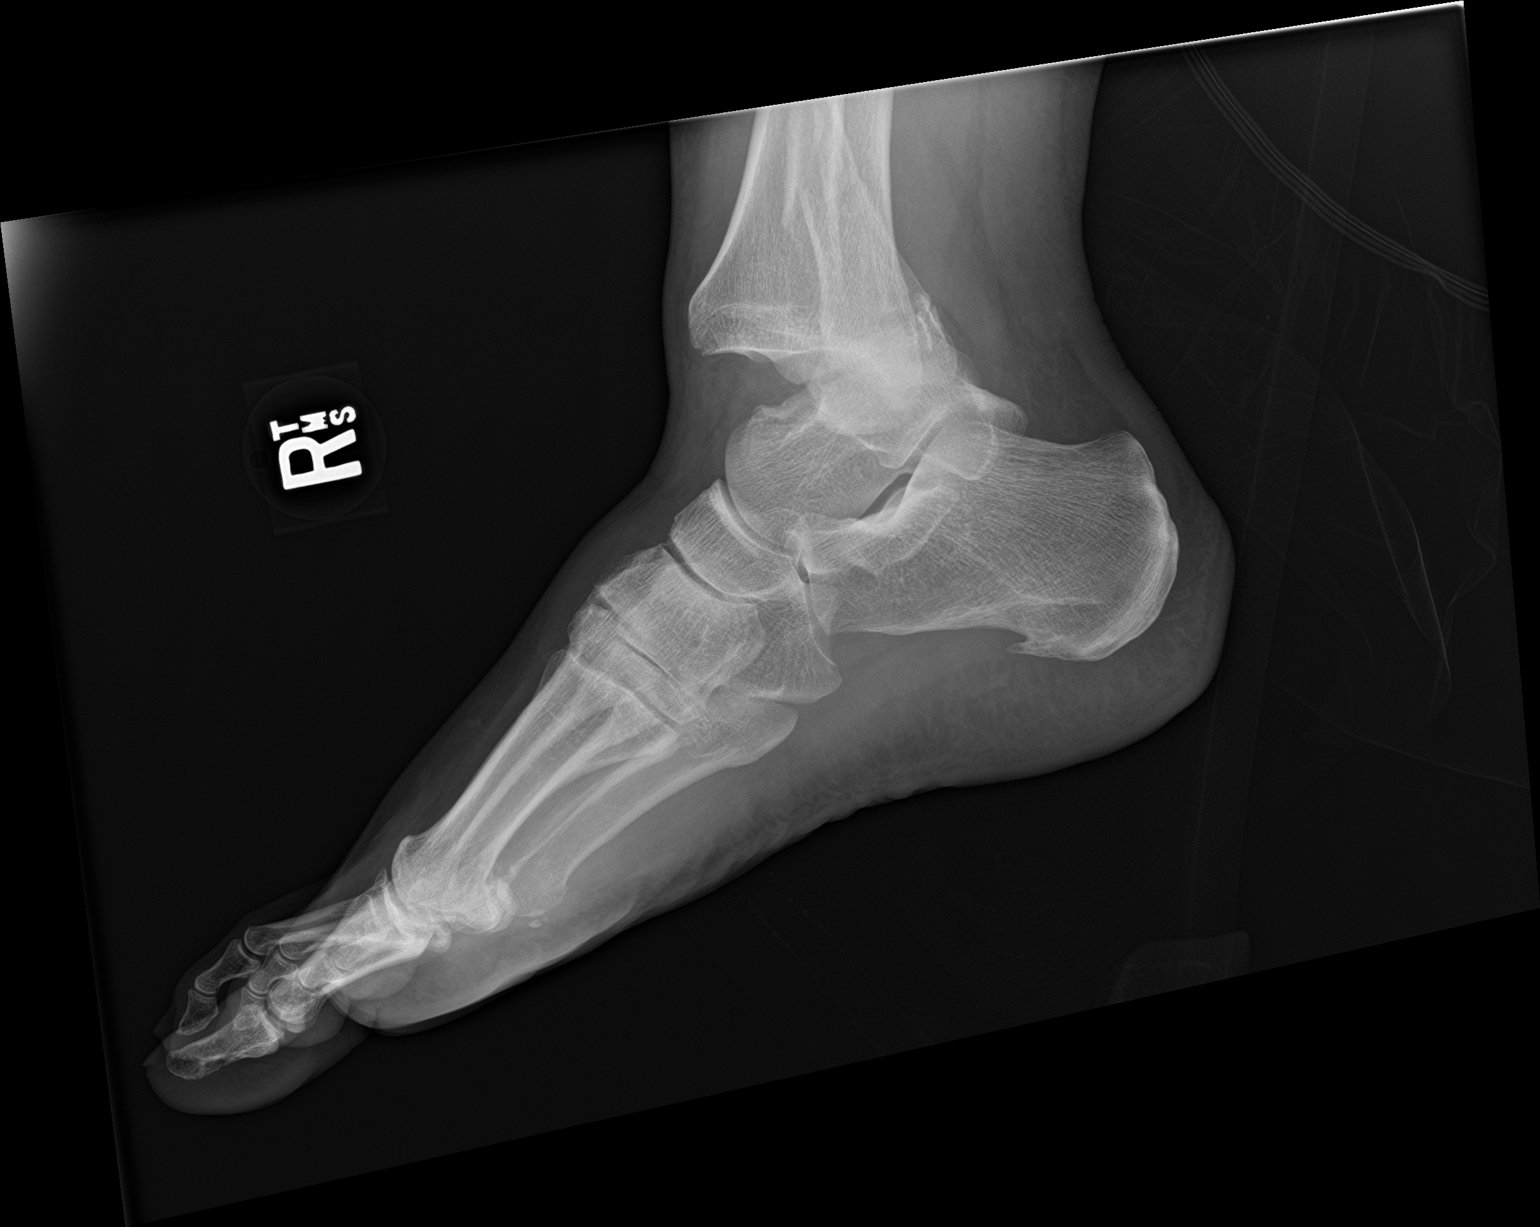

[foot obl]
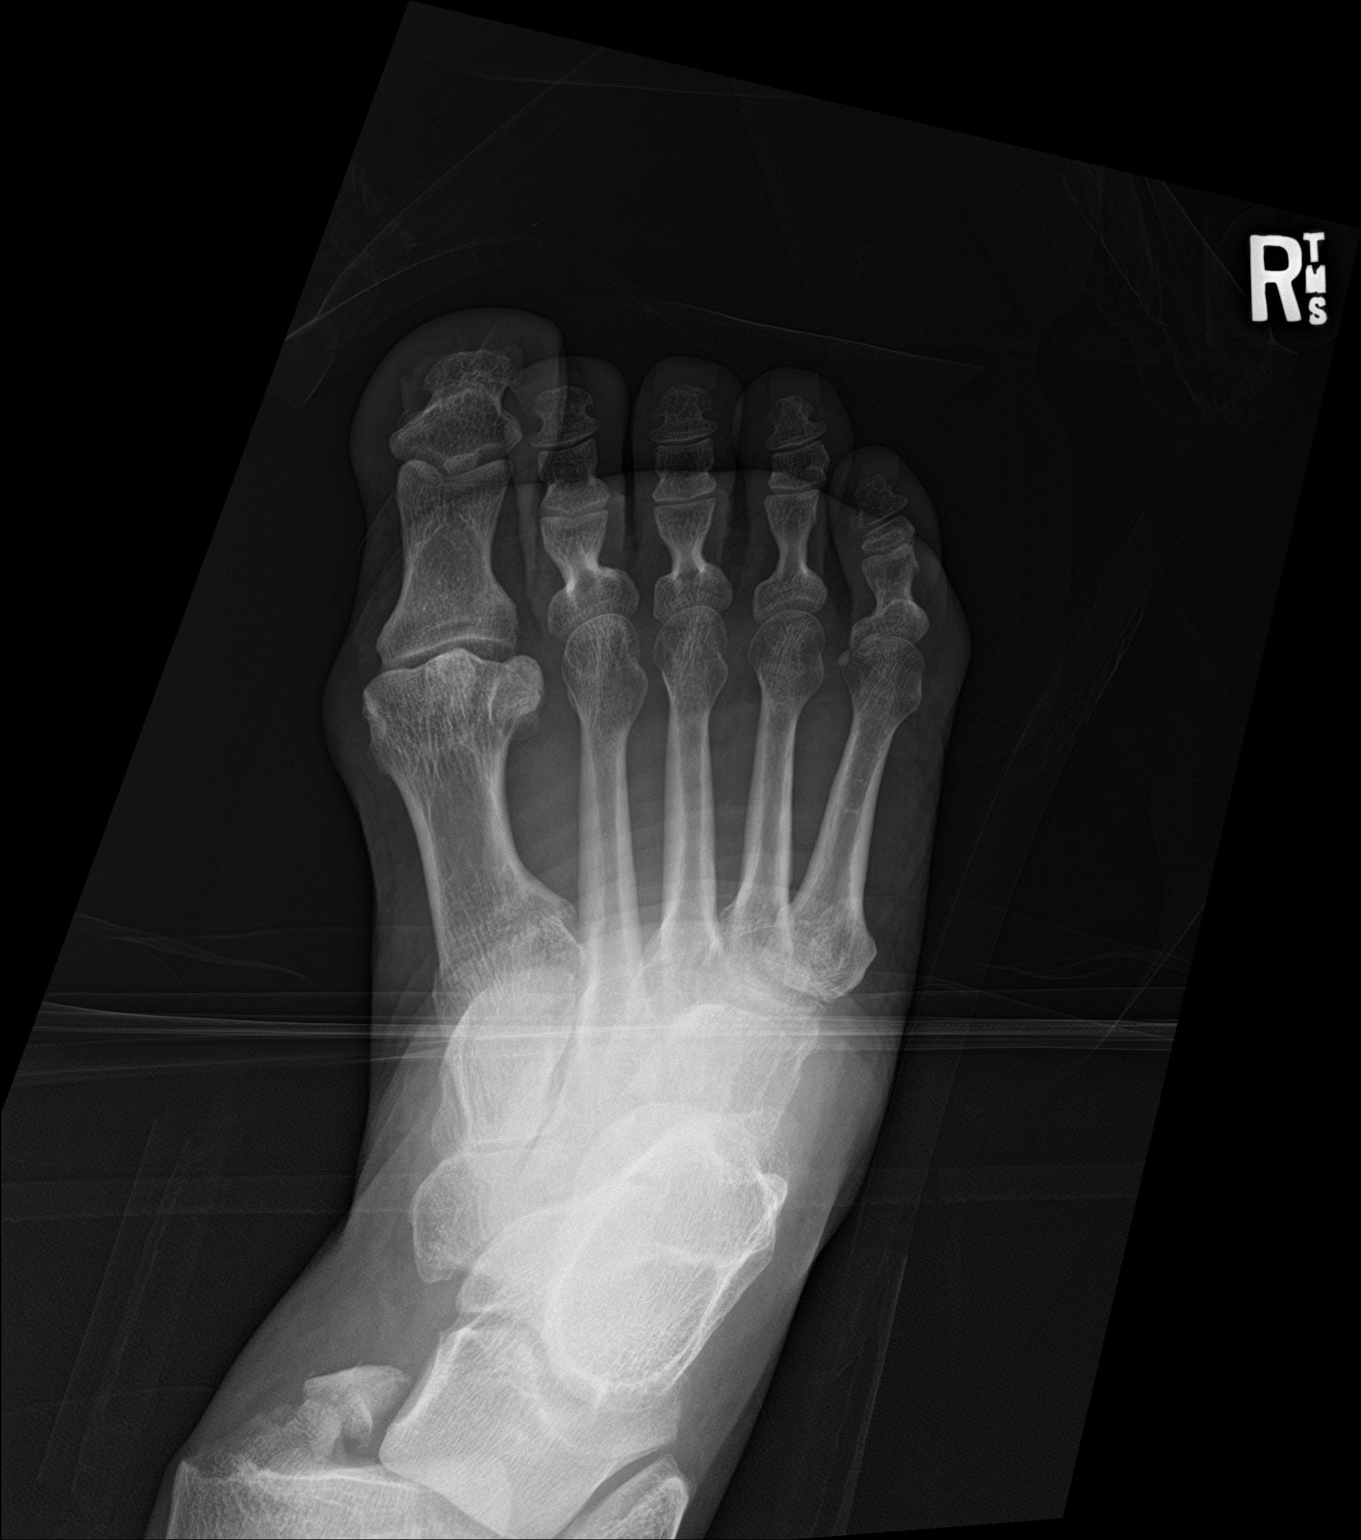

[3 of 3 positions shown; findings below may reference images not displayed]

FINDINGS: Distal fibular and tibial fractures are noted as seen on dedicated
plain films. No other acute bony or joint abnormality.
IMPRESSION: Ankle fractures as seen on dedicated plain films of the right ankle
today. No other acute finding.

## 2019-11-23 ENCOUNTER — Ambulatory Visit: Payer: No Typology Code available for payment source

## 2019-11-24 ENCOUNTER — Ambulatory Visit: Payer: No Typology Code available for payment source

## 2019-11-26 ENCOUNTER — Ambulatory Visit: Payer: No Typology Code available for payment source | Attending: Internal Medicine

## 2019-11-26 DIAGNOSIS — Z23 Encounter for immunization: Secondary | ICD-10-CM

## 2019-11-26 NOTE — Progress Notes (Signed)
   Covid-19 Vaccination Clinic  Name:  Dawn Mcguire    MRN: [id number] DOB: 12/21/1965  11/26/2019  Ms. Wash was observed post Covid-19 immunization for 15 minutes without incident. She was provided with Vaccine Information Sheet and instruction to access the V-Safe system.   Ms. Rollin was instructed to call 911 with any severe reactions post vaccine: Marland Kitchen Difficulty breathing  . Swelling of face and throat  . A fast heartbeat  . A bad rash all over body  . Dizziness and weakness   Immunizations Administered    Name Date Dose VIS Date Route   Pfizer COVID-19 Vaccine 11/26/2019  4:06 PM 0.3 mL 09/22/2018 Intramuscular   Manufacturer: [place]   Lot: [id number]   [place]: [id number]

## 2020-08-29 ENCOUNTER — Encounter: Payer: Self-pay | Admitting: Family Medicine

## 2020-08-31 ENCOUNTER — Telehealth (INDEPENDENT_AMBULATORY_CARE_PROVIDER_SITE_OTHER): Payer: No Typology Code available for payment source | Admitting: Family Medicine

## 2020-08-31 DIAGNOSIS — U071 COVID-19: Secondary | ICD-10-CM | POA: Diagnosis not present

## 2020-08-31 MED ORDER — BENZONATATE 100 MG PO CAPS: 100.0000 mg | ORAL_CAPSULE | Freq: Three times a day (TID) | ORAL | 0 refills | Status: DC | PRN

## 2020-08-31 NOTE — Patient Instructions (Addendum)
  HOME CARE TIPS:   -I sent the medication(s) we discussed to your pharmacy: Meds ordered this encounter  Medications  . benzonatate (TESSALON PERLES) 100 MG capsule    Sig: Take 1 capsule (100 mg total) by mouth 3 (three) times daily as needed.    Dispense:  20 capsule    Refill:  0    -can use nasal saline a few times per day if you have nasal congestion  -stay hydrated, drink plenty of fluids and eat small healthy meals - avoid dairy  -can take 1000 IU (51mcg) Vit D3 and 100-500 mg of Vit C daily per instructions  -If the Covid test is positive, check out the CDC website for more information on home care, transmission and treatment for COVID19  -follow up with your doctor in 2-3 days unless improving and feeling better  -stay home while sick, except to seek medical care, and if you have COVID19 ideally it would be best to stay home for a full 10 days since the onset of symptoms PLUS one day of no fever and feeling better. Wear a good mask (such as N95 or KN95) if around others to reduce the risk of transmission.  It was nice to meet you today, and I really hope you are feeling better soon. I help [facility] out with telemedicine visits on Tuesdays and Thursdays and am available for visits on those days. If you have any concerns or questions following this visit please schedule a follow up visit with your Primary Care doctor or seek care at a local urgent care clinic to avoid delays in care.    Seek in person care or schedule a follow up video visit promptly if your symptoms worsen, new concerns arise or you are not improving with treatment. Call 911 and/or seek emergency care if your symptoms are severe or life threatening.

## 2020-08-31 NOTE — Progress Notes (Signed)
Virtual Visit via Video Note  I connected with Al[place]  on 08/31/20 at  5:20 PM EST by a video enabled telemedicine application and verified that I am speaking with the correct person using two identifiers.  Location patient: home, [place] Location provider:work or home office Persons participating in the virtual visit: patient, provider  I discussed the limitations of evaluation and management by telemedicine and the availability of in person appointments. The patient expressed understanding and agreed to proceed.   HPI:  Acute telemedicine visit for COVID19: -Onset: 5 days ago -Symptoms include: nasal congestion, sore throat, cough, sweats at times,  one loose stool -Denies: CP, SOB, NVD, inability to eat/drink/get out of bed -Pertinent past medical history: none per patient -Pertinent medication allergies: latex, sulfa, corticosteroids, penicillins, prednisone, tramadol, macrobid -COVID-19 vaccine status: fully vaccinated x2 pfizer  ROS: See pertinent positives and negatives per HPI.  Past Medical History:  Diagnosis Date  . Arthritis   . Carpal tunnel syndrome of right wrist   . Closed displaced trimalleolar fracture of right ankle 11/20/2018  . GERD (gastroesophageal reflux disease)   . Psoriasis     Past Surgical History:  Procedure Laterality Date  . ABDOMINAL HYSTERECTOMY    . BLADDER REPAIR    . BREAST SURGERY    . CARPAL TUNNEL RELEASE     on the right wrist  . CHOLECYSTECTOMY    . INSERTION OF MESH N/A 10/16/2017   Procedure: INSERTION OF MESH;  Surgeon: Donnie Mesa, MD;  Location: [facility];  Service: General;  Laterality: N/A;  . IR US GUIDE BX ASP/DRAIN  12/31/2017  . ORIF ANKLE FRACTURE Right 11/20/2018   Procedure: OPEN REDUCTION INTERNAL FIXATION (ORIF) ANKLE FRACTURE AND SYNDESMOSIS;  Surgeon: Marchia Bond, MD;  Location: [facility];  Service: Orthopedics;  Laterality: Right;  . VENTRAL HERNIA REPAIR N/A 10/16/2017   Procedure: OPEN VENTRAL HERNIA  REPAIR WITH MESH;  Surgeon: Donnie Mesa, MD;  Location: [facility];  Service: General;  Laterality: N/A;     Current Outpatient Medications:  .  benzonatate (TESSALON PERLES) 100 MG capsule, Take 1 capsule (100 mg total) by mouth 3 (three) times daily as needed., Disp: 20 capsule, Rfl: 0 .  ALPRAZolam (XANAX) 0.5 MG tablet, Take 0.5 mg by mouth daily as needed for anxiety., Disp: , Rfl:  .  APPLE CIDER VINEGAR PO, Take 2 capsules by mouth daily., Disp: , Rfl:  .  cholecalciferol (VITAMIN D3) 25 MCG (1000 UT) tablet, Take 2,000 Units by mouth daily., Disp: , Rfl:  .  Cyanocobalamin (B-12) 2500 MCG TABS, Take 2,500 mcg by mouth daily., Disp: , Rfl:  .  diltiazem 2 % GEL, Apply a dab to fissure three times daily for 4 weeks., Disp: 60 g, Rfl: 0 .  fluticasone (FLONASE) 50 MCG/ACT nasal spray, Place 1 spray into both nostrils daily as needed for allergies or rhinitis., Disp: , Rfl:  .  naproxen sodium (ALEVE) 220 MG tablet, Take 440 mg by mouth 2 (two) times daily. , Disp: , Rfl:  .  omeprazole (PRILOSEC) 20 MG capsule, Take 20 mg by mouth daily., Disp: , Rfl:  .  tamsulosin (FLOMAX) 0.4 MG CAPS capsule, Take 1 capsule (0.4 mg total) by mouth daily after supper., Disp: 10 capsule, Rfl: 0 .  triamcinolone ointment (KENALOG) 0.5 %, Apply a thin coat to poison vine rash twice daily. Not for face or genital areas. (Patient taking differently: Apply 1 application topically 2 (two) times daily as needed (poison oak).  Not for face or genital areas.), Disp: 45 g, Rfl: 0  EXAM:  VITALS per patient if applicable:  GENERAL: alert, oriented, appears well and in no acute distress  HEENT: atraumatic, conjunttiva clear, no obvious abnormalities on inspection of external nose and ears  NECK: normal movements of the head and neck  LUNGS: on inspection no signs of respiratory distress, breathing rate appears normal, no obvious gross SOB, gasping or wheezing  CV: no obvious cyanosis  MS: moves all visible  extremities without noticeable abnormality  PSYCH/NEURO: pleasant and cooperative, no obvious depression or anxiety, speech and thought processing grossly intact  ASSESSMENT AND PLAN:  Discussed the following assessment and plan:  COVID-19  -we discussed possible serious and likely etiologies, options for evaluation and workup, limitations of telemedicine visit vs in person visit, treatment, treatment risks and precautions. Pt prefers to treat via telemedicine empirically rather than in person at this moment.  Discussed treatment options, ideal treatment window, potential complications, isolation and precautions for COVID-19.  She declined referral for Covid outpatient treatment at this time. She did want a prescription for cough, Tessalon Rx sent.  Other symptomatic care measures summarized in patient instructions. Work/School slipped offered:   declined Scheduled follow up with PCP offered:agrees to follow up with PCP office if needed. Advised to seek prompt in person care if worsening, new symptoms arise, or if is not improving with treatment. Discussed options for inperson care if PCP office not available. Did let this patient know that I only do telemedicine on Tuesdays and Thursdays for [facility]. Advised to schedule follow up visit with PCP or [facility] if any further questions or concerns to avoid delays in care.   I discussed the assessment and treatment plan with the patient. The patient was provided an opportunity to ask questions and all were answered. The patient agreed with the plan and demonstrated an understanding of the instructions.     Lucretia Kern, DO

## 2020-10-01 ENCOUNTER — Encounter: Payer: Self-pay | Admitting: Family Medicine

## 2020-10-02 ENCOUNTER — Encounter: Payer: Self-pay | Admitting: Family Medicine

## 2020-10-02 ENCOUNTER — Telehealth (INDEPENDENT_AMBULATORY_CARE_PROVIDER_SITE_OTHER): Payer: No Typology Code available for payment source | Admitting: Family Medicine

## 2020-10-02 VITALS — BP 119/69 | Temp 98.5°F | Ht 68.0 in

## 2020-10-02 DIAGNOSIS — H6983 Other specified disorders of Eustachian tube, bilateral: Secondary | ICD-10-CM

## 2020-10-02 DIAGNOSIS — J0100 Acute maxillary sinusitis, unspecified: Secondary | ICD-10-CM | POA: Diagnosis not present

## 2020-10-02 MED ORDER — CLARITHROMYCIN ER 500 MG PO TB24: 1000.0000 mg | ORAL_TABLET | Freq: Every day | ORAL | 0 refills | Status: AC

## 2020-10-02 NOTE — Progress Notes (Signed)
Established Patient Office Visit  Subjective:  Patient ID: Dawn Mcguire, female    DOB: Dec 17, 1965  Age: 55 y.o. MRN: [id number]  CC:  Chief Complaint  Patient presents with  . Sinus Problem    C/O sinus issues ear pain ears clogged, nasal congestion thick green mucus. OTC medications not helping.     HPI Dawn Mcguire presents for evaluation of a 7 to 10-day history of URI symptoms that have persisted despite treatment with Mucinex DM as well as Claritin-D.  She has been afebrile.  She has pressure pain in her cheek bones.  She is having teeth pain.  Her ears are congestion and her hearing is muffled.  She tested negative for Covid.  She is blowing thick green rhinorrhea.  She did denies wheezing or cough at this time.  Past Medical History:  Diagnosis Date  . Arthritis   . Carpal tunnel syndrome of right wrist   . Closed displaced trimalleolar fracture of right ankle 11/20/2018  . GERD (gastroesophageal reflux disease)   . Psoriasis     Past Surgical History:  Procedure Laterality Date  . ABDOMINAL HYSTERECTOMY    . BLADDER REPAIR    . BREAST SURGERY    . CARPAL TUNNEL RELEASE     on the right wrist  . CHOLECYSTECTOMY    . INSERTION OF MESH N/A 10/16/2017   Procedure: INSERTION OF MESH;  Surgeon: Donnie Mesa, MD;  Location: [facility];  Service: General;  Laterality: N/A;  . IR US GUIDE BX ASP/DRAIN  12/31/2017  . ORIF ANKLE FRACTURE Right 11/20/2018   Procedure: OPEN REDUCTION INTERNAL FIXATION (ORIF) ANKLE FRACTURE AND SYNDESMOSIS;  Surgeon: Marchia Bond, MD;  Location: [facility];  Service: Orthopedics;  Laterality: Right;  . VENTRAL HERNIA REPAIR N/A 10/16/2017   Procedure: OPEN VENTRAL HERNIA REPAIR WITH MESH;  Surgeon: Donnie Mesa, MD;  Location: [facility];  Service: General;  Laterality: N/A;    Family History  Problem Relation Age of Onset  . Arthritis Mother   . Heart attack Mother   . High blood pressure Mother   . Cancer Father   . Diabetes  Father   . Arthritis Brother   . Arthritis Brother   . Arthritis Brother   . Diabetes Son     Social History   Socioeconomic History  . Marital status: Married    Spouse name: Not on file  . Number of children: Not on file  . Years of education: Not on file  . Highest education level: Not on file  Occupational History  . Not on file  Tobacco Use  . Smoking status: Never Smoker  . Smokeless tobacco: Never Used  Vaping Use  . Vaping Use: Never used  Substance and Sexual Activity  . Alcohol use: No  . Drug use: No  . Sexual activity: Yes    Birth control/protection: Surgical  Other Topics Concern  . Not on file  Social History Narrative  . Not on file   Social Determinants of Health   Financial Resource Strain: Not on file  Food Insecurity: Not on file  Transportation Needs: Not on file  Physical Activity: Not on file  Stress: Not on file  Social Connections: Not on file  Intimate Partner Violence: Not on file    Outpatient Medications Prior to Visit  Medication Sig Dispense Refill  . ALPRAZolam (XANAX) 0.5 MG tablet Take 0.5 mg by mouth daily as needed for anxiety.    . cholecalciferol (VITAMIN D3)  25 MCG (1000 UT) tablet Take 2,000 Units by mouth daily.    . Cyanocobalamin (B-12) 2500 MCG TABS Take 2,500 mcg by mouth daily.    . naproxen sodium (ALEVE) 220 MG tablet Take 440 mg by mouth 2 (two) times daily.     . APPLE CIDER VINEGAR PO Take 2 capsules by mouth daily. (Patient not taking: Reported on 10/02/2020)    . benzonatate (TESSALON PERLES) 100 MG capsule Take 1 capsule (100 mg total) by mouth 3 (three) times daily as needed. (Patient not taking: Reported on 10/02/2020) 20 capsule 0  . diltiazem 2 % GEL Apply a dab to fissure three times daily for 4 weeks. (Patient not taking: Reported on 10/02/2020) 60 g 0  . fluticasone (FLONASE) 50 MCG/ACT nasal spray Place 1 spray into both nostrils daily as needed for allergies or rhinitis. (Patient not taking: Reported on  10/02/2020)    . omeprazole (PRILOSEC) 20 MG capsule Take 20 mg by mouth daily. (Patient not taking: Reported on 10/02/2020)    . tamsulosin (FLOMAX) 0.4 MG CAPS capsule Take 1 capsule (0.4 mg total) by mouth daily after supper. (Patient not taking: Reported on 10/02/2020) 10 capsule 0  . triamcinolone ointment (KENALOG) 0.5 % Apply a thin coat to poison vine rash twice daily. Not for face or genital areas. (Patient not taking: Reported on 10/02/2020) 45 g 0   No facility-administered medications prior to visit.    Allergies  Allergen Reactions  . Latex Other (See Comments)    Tears skin and break out  . Sulfa Antibiotics Itching and Nausea And Vomiting    Skin becomes red and feels like it is burning  . Sulfasalazine Hives, Itching and Nausea And Vomiting    Skin becomes red and feels like it is burning   . Corticosteroids Hives, Itching and Rash    fever  . Nitrofurantoin Monohyd Macro Nausea And Vomiting  . Penicillins Hives, Itching and Rash    Did it involve swelling of the face/tongue/throat, SOB, or low BP? No Did it involve sudden or severe rash/hives, skin peeling, or any reaction on the inside of your mouth or nose? No Did you need to seek medical attention at a hospital or doctor's office? No When did it last happen?5 + years If all above answers are "NO", may proceed with cephalosporin use.    . Prednisone Hives, Itching and Other (See Comments)    fever  . Tramadol Nausea Only    ROS Review of Systems  Constitutional: Positive for fatigue. Negative for chills, diaphoresis, fever and unexpected weight change.  HENT: Positive for congestion, dental problem, hearing loss, postnasal drip, rhinorrhea, sinus pressure and sinus pain. Negative for ear discharge, trouble swallowing and voice change.   Eyes: Negative for photophobia and visual disturbance.  Respiratory: Negative for cough and wheezing.   Cardiovascular: Negative.   Gastrointestinal: Negative.    Genitourinary: Negative.   Musculoskeletal: Negative for arthralgias and myalgias.  Skin: Negative.   Neurological: Negative for headaches.      Objective:    Physical Exam Vitals and nursing note reviewed.  Constitutional:      General: She is not in acute distress.    Appearance: Normal appearance. She is not ill-appearing, toxic-appearing or diaphoretic.  HENT:     Head: Normocephalic and atraumatic.     Right Ear: External ear normal.     Left Ear: External ear normal.  Eyes:     Conjunctiva/sclera: Conjunctivae normal.  Pulmonary:  Effort: Pulmonary effort is normal.  Skin:    General: Skin is warm and dry.  Neurological:     Mental Status: She is alert and oriented to person, place, and time.  Psychiatric:        Mood and Affect: Mood normal.        Behavior: Behavior normal.     BP 119/69   Temp 98.5 F (36.9 C) (Temporal)   Ht 5\' 8"  (1.727 m)   BMI 37.86 kg/m  Wt Readings from Last 3 Encounters:  06/07/19 249 lb (112.9 kg)  02/11/19 246 lb (111.6 kg)  11/19/18 249 lb (112.9 kg)     Health Maintenance Due  Topic Date Due  . Hepatitis C Screening  Never done  . HIV Screening  Never done  . PAP SMEAR-Modifier  Never done  . COLONOSCOPY (Pts 45-3yrs Insurance coverage will need to be confirmed)  Never done  . MAMMOGRAM  Never done  . COVID-19 Vaccine (3 - Booster for Pfizer series) 05/27/2020    There are no preventive care reminders to display for this patient.  No results found for: TSH Lab Results  Component Value Date   WBC 8.7 03/10/2019   HGB 14.1 03/10/2019   HCT 42.8 03/10/2019   MCV 90.9 03/10/2019   PLT 301 03/10/2019   Lab Results  Component Value Date   NA 138 03/10/2019   K 3.5 03/10/2019   CO2 23 03/10/2019   GLUCOSE 111 (H) 03/10/2019   BUN 21 (H) 03/10/2019   CREATININE 0.86 03/10/2019   BILITOT 1.0 03/04/2019   ALKPHOS 108 03/04/2019   AST 19 03/04/2019   ALT 16 03/04/2019   PROT 7.5 03/04/2019   ALBUMIN 4.3  03/04/2019   CALCIUM 9.0 03/10/2019   ANIONGAP 11 03/10/2019   No results found for: CHOL No results found for: HDL No results found for: LDLCALC No results found for: TRIG No results found for: CHOLHDL No results found for: HGBA1C    Assessment & Plan:   Problem List Items Addressed This Visit      Respiratory   Acute non-recurrent maxillary sinusitis - Primary   Relevant Medications   clarithromycin (BIAXIN XL) 500 MG 24 hr tablet     Nervous and Auditory   Dysfunction of both eustachian tubes      Meds ordered this encounter  Medications  . clarithromycin (BIAXIN XL) 500 MG 24 hr tablet    Sig: Take 2 tablets (1,000 mg total) by mouth daily for 10 days.    Dispense:  20 tablet    Refill:  0    Follow-up: No follow-ups on file.  Patient will discontinue Claritin-D for now.  She will change to Mucinex D to help decongest her ears and sinuses.  She will complete the course of Biaxin.  Libby Maw, MD

## 2020-10-23 ENCOUNTER — Encounter: Payer: Self-pay | Admitting: Family Medicine

## 2020-10-24 ENCOUNTER — Telehealth: Payer: Self-pay

## 2020-10-24 DIAGNOSIS — B3731 Acute candidiasis of vulva and vagina: Secondary | ICD-10-CM

## 2020-10-24 DIAGNOSIS — B373 Candidiasis of vulva and vagina: Secondary | ICD-10-CM

## 2020-10-24 MED ORDER — FLUCONAZOLE 150 MG PO TABS: 150.0000 mg | ORAL_TABLET | Freq: Once | ORAL | 0 refills | Status: AC

## 2020-10-24 NOTE — Telephone Encounter (Signed)
Pt is checking on status of this message. She would like to know if she can get this script. Please advise pt at [phone].

## 2020-10-24 NOTE — Telephone Encounter (Signed)
Sorry for the last minute message patient calling to see if she could have something sent in for yeast infection after taking antibiotics. Patient was seen in by Dr. Ethelene Hal on 10/02/20 and put on antibiotics. She called yesterday for something to treat yeast but called back this evening because she had not heard from [place]. Message sent to PCP but nothing sent in as of yet. Could you take a look and see if patient could have something sent in? I did not see in her medication list where antibiotics were sent in but I did see in Dr. Bebe Shaggy note from 10/02/20. Please advise Doc of day already gone for the day

## 2020-12-07 ENCOUNTER — Other Ambulatory Visit: Payer: Self-pay

## 2020-12-08 ENCOUNTER — Ambulatory Visit (INDEPENDENT_AMBULATORY_CARE_PROVIDER_SITE_OTHER): Payer: No Typology Code available for payment source | Admitting: Family Medicine

## 2020-12-08 ENCOUNTER — Encounter: Payer: Self-pay | Admitting: Family Medicine

## 2020-12-08 NOTE — Progress Notes (Signed)
Established Patient Office Visit  Subjective:  Patient ID: Dawn Mcguire, female    DOB: 08-Jan-1966  Age: 55 y.o. MRN: [id number]  CC:  Chief Complaint  Patient presents with  . Nutrition Counseling    Patient would like to discuss having surgery for weight loss.     HPI Dawn Mcguire presents for a referral to plastic surgery for evaluation of panniculectomy.  She has lost 32 pounds of the last several months with healthy lifestyle changes including diet and exercise.  The downside of this weight loss is that it is left her weight with a large fold of skin in her lower abdomen.  It is been difficult for her to toilet.  She suffers from multiple skin infections secondary to skin on skin contact.  Past Medical History:  Diagnosis Date  . Arthritis   . Carpal tunnel syndrome of right wrist   . Closed displaced trimalleolar fracture of right ankle 11/20/2018  . GERD (gastroesophageal reflux disease)   . Psoriasis     Past Surgical History:  Procedure Laterality Date  . ABDOMINAL HYSTERECTOMY    . BLADDER REPAIR    . BREAST SURGERY    . CARPAL TUNNEL RELEASE     on the right wrist  . CHOLECYSTECTOMY    . INSERTION OF MESH N/A 10/16/2017   Procedure: INSERTION OF MESH;  Surgeon: Donnie Mesa, MD;  Location: [facility];  Service: General;  Laterality: N/A;  . IR US GUIDE BX ASP/DRAIN  12/31/2017  . ORIF ANKLE FRACTURE Right 11/20/2018   Procedure: OPEN REDUCTION INTERNAL FIXATION (ORIF) ANKLE FRACTURE AND SYNDESMOSIS;  Surgeon: Marchia Bond, MD;  Location: [facility];  Service: Orthopedics;  Laterality: Right;  . VENTRAL HERNIA REPAIR N/A 10/16/2017   Procedure: OPEN VENTRAL HERNIA REPAIR WITH MESH;  Surgeon: Donnie Mesa, MD;  Location: [place];  Service: General;  Laterality: N/A;    Family History  Problem Relation Age of Onset  . Arthritis Mother   . Heart attack Mother   . High blood pressure Mother   . Cancer Father   . Diabetes Father   . Arthritis  Brother   . Arthritis Brother   . Arthritis Brother   . Diabetes Son     Social History   Socioeconomic History  . Marital status: Married    Spouse name: Not on file  . Number of children: Not on file  . Years of education: Not on file  . Highest education level: Not on file  Occupational History  . Not on file  Tobacco Use  . Smoking status: Never Smoker  . Smokeless tobacco: Never Used  Vaping Use  . Vaping Use: Never used  Substance and Sexual Activity  . Alcohol use: No  . Drug use: No  . Sexual activity: Yes    Birth control/protection: Surgical  Other Topics Concern  . Not on file  Social History Narrative  . Not on file   Social Determinants of Health   Financial Resource Strain: Not on file  Food Insecurity: Not on file  Transportation Needs: Not on file  Physical Activity: Not on file  Stress: Not on file  Social Connections: Not on file  Intimate Partner Violence: Not on file    Outpatient Medications Prior to Visit  Medication Sig Dispense Refill  . ALPRAZolam (XANAX) 0.5 MG tablet Take 0.5 mg by mouth daily as needed for anxiety.    . Biotin 1 MG CAPS     . cholecalciferol (  VITAMIN D3) 25 MCG (1000 UT) tablet Take 2,000 Units by mouth daily.    . Cyanocobalamin (B-12) 2500 MCG TABS Take 2,500 mcg by mouth daily.    Marland Kitchen loratadine (CLARITIN) 10 MG tablet 1 tablet    . Vitamin E 100 UNIT/GM CREA     . APPLE CIDER VINEGAR PO Take 2 capsules by mouth daily. (Patient not taking: Reported on 10/02/2020)    . benzonatate (TESSALON PERLES) 100 MG capsule Take 1 capsule (100 mg total) by mouth 3 (three) times daily as needed. (Patient not taking: Reported on 10/02/2020) 20 capsule 0  . diltiazem 2 % GEL Apply a dab to fissure three times daily for 4 weeks. (Patient not taking: Reported on 10/02/2020) 60 g 0  . fluticasone (FLONASE) 50 MCG/ACT nasal spray Place 1 spray into both nostrils daily as needed for allergies or rhinitis. (Patient not taking: Reported on  10/02/2020)    . naproxen sodium (ALEVE) 220 MG tablet Take 440 mg by mouth 2 (two) times daily.     Marland Kitchen omeprazole (PRILOSEC) 20 MG capsule Take 20 mg by mouth daily. (Patient not taking: Reported on 10/02/2020)    . tamsulosin (FLOMAX) 0.4 MG CAPS capsule Take 1 capsule (0.4 mg total) by mouth daily after supper. (Patient not taking: Reported on 10/02/2020) 10 capsule 0  . triamcinolone ointment (KENALOG) 0.5 % Apply a thin coat to poison vine rash twice daily. Not for face or genital areas. (Patient not taking: Reported on 10/02/2020) 45 g 0   No facility-administered medications prior to visit.    Allergies  Allergen Reactions  . Latex Other (See Comments)    Tears skin and break out  . Sulfa Antibiotics Itching and Nausea And Vomiting    Skin becomes red and feels like it is burning  . Sulfasalazine Hives, Itching and Nausea And Vomiting    Skin becomes red and feels like it is burning   . Corticosteroids Hives, Itching and Rash    fever  . Nitrofurantoin Monohyd Macro Nausea And Vomiting  . Penicillins Hives, Itching and Rash    Did it involve swelling of the face/tongue/throat, SOB, or low BP? No Did it involve sudden or severe rash/hives, skin peeling, or any reaction on the inside of your mouth or nose? No Did you need to seek medical attention at a hospital or doctor's office? No When did it last happen?5 + years If all above answers are "NO", may proceed with cephalosporin use.    . Prednisone Hives, Itching and Other (See Comments)    fever  . Tramadol Nausea Only    ROS Review of Systems  Constitutional: Negative.   Respiratory: Negative.   Cardiovascular: Negative.   Gastrointestinal: Negative.   Psychiatric/Behavioral: Negative.       Objective:    Physical Exam Vitals and nursing note reviewed.  Constitutional:      General: She is not in acute distress.    Appearance: Normal appearance. She is not ill-appearing, toxic-appearing or diaphoretic.  Eyes:      General:        Right eye: No discharge.        Left eye: No discharge.     Conjunctiva/sclera: Conjunctivae normal.  Cardiovascular:     Rate and Rhythm: Normal rate and regular rhythm.  Pulmonary:     Effort: Pulmonary effort is normal.     Breath sounds: Normal breath sounds.  Skin:    General: Skin is warm and dry.  Neurological:  Mental Status: She is alert and oriented to person, place, and time.  Psychiatric:        Mood and Affect: Mood normal.        Behavior: Behavior normal.     BP 110/68   Pulse 81   Temp (!) 97.1 F (36.2 C) (Temporal)   Ht 5\' 8"  (1.727 m)   Wt 253 lb 3.2 oz (114.9 kg)   SpO2 98%   BMI 38.50 kg/m  Wt Readings from Last 3 Encounters:  12/08/20 253 lb 3.2 oz (114.9 kg)  06/07/19 249 lb (112.9 kg)  02/11/19 246 lb (111.6 kg)     Health Maintenance Due  Topic Date Due  . HIV Screening  Never done  . Hepatitis C Screening  Never done  . PAP SMEAR-Modifier  Never done  . COLONOSCOPY (Pts 45-63yrs Insurance coverage will need to be confirmed)  Never done  . MAMMOGRAM  Never done  . COVID-19 Vaccine (3 - Pfizer risk 4-dose series) 12/24/2019    There are no preventive care reminders to display for this patient.  No results found for: TSH Lab Results  Component Value Date   WBC 8.7 03/10/2019   HGB 14.1 03/10/2019   HCT 42.8 03/10/2019   MCV 90.9 03/10/2019   PLT 301 03/10/2019   Lab Results  Component Value Date   NA 138 03/10/2019   K 3.5 03/10/2019   CO2 23 03/10/2019   GLUCOSE 111 (H) 03/10/2019   BUN 21 (H) 03/10/2019   CREATININE 0.86 03/10/2019   BILITOT 1.0 03/04/2019   ALKPHOS 108 03/04/2019   AST 19 03/04/2019   ALT 16 03/04/2019   PROT 7.5 03/04/2019   ALBUMIN 4.3 03/04/2019   CALCIUM 9.0 03/10/2019   ANIONGAP 11 03/10/2019   No results found for: CHOL No results found for: HDL No results found for: LDLCALC No results found for: TRIG No results found for: CHOLHDL No results found for: HGBA1C     Assessment & Plan:   Problem List Items Addressed This Visit      Other   Morbid obesity ([place]) - Primary   Relevant Orders   Ambulatory referral to Plastic Surgery      No orders of the defined types were placed in this encounter.   Follow-up: Return return on 6/6 for physical exam..    Libby Maw, MD

## 2020-12-18 ENCOUNTER — Telehealth: Payer: Self-pay | Admitting: Family Medicine

## 2020-12-18 NOTE — Telephone Encounter (Signed)
Patient states that she's going to send over the labs she just had done for her insurance. She wants Dr. Ethelene Hal to review them. She's coming in for a Physical on 06/24 and does not want to repeat them (labs) if she doesn't have to. Please call her once labs have been reviewed.

## 2020-12-22 ENCOUNTER — Institutional Professional Consult (permissible substitution): Payer: No Typology Code available for payment source | Admitting: Plastic Surgery

## 2021-01-19 ENCOUNTER — Encounter: Payer: No Typology Code available for payment source | Admitting: Family Medicine

## 2021-03-14 ENCOUNTER — Ambulatory Visit (INDEPENDENT_AMBULATORY_CARE_PROVIDER_SITE_OTHER): Payer: No Typology Code available for payment source | Admitting: Family Medicine

## 2021-03-14 ENCOUNTER — Encounter: Payer: Self-pay | Admitting: Family Medicine

## 2021-03-14 ENCOUNTER — Other Ambulatory Visit: Payer: Self-pay

## 2021-03-14 VITALS — BP 124/76 | HR 72 | Temp 96.3°F | Ht 68.0 in | Wt 262.6 lb

## 2021-03-14 DIAGNOSIS — N3000 Acute cystitis without hematuria: Secondary | ICD-10-CM | POA: Diagnosis not present

## 2021-03-14 MED ORDER — CIPROFLOXACIN HCL 500 MG PO TABS: 500.0000 mg | ORAL_TABLET | Freq: Two times a day (BID) | ORAL | 0 refills | Status: AC

## 2021-03-14 NOTE — Progress Notes (Addendum)
Established Patient Office Visit  Subjective:  Patient ID: Dawn Mcguire, female    DOB: 1965/10/05  Age: 55 y.o. MRN: [id number]  CC:  Chief Complaint  Patient presents with   URI    Possible UTI, urgency to urinate, back pains, urine dark in color x 3 days.     HPI [name] presents for a 1 day history of suprapubic pressure, urinary frequency, incomplete emptying, urgency.  There is been no fever chills nausea or vomiting.  She is married in a stable relationship.  History of UTI and renal lithiasis.  Unable to take Septra or Macrobid.  Past Medical History:  Diagnosis Date   Arthritis    Carpal tunnel syndrome of right wrist    Closed displaced trimalleolar fracture of right ankle 11/20/2018   GERD (gastroesophageal reflux disease)    Psoriasis     Past Surgical History:  Procedure Laterality Date   ABDOMINAL HYSTERECTOMY     BLADDER REPAIR     BREAST SURGERY     CARPAL TUNNEL RELEASE     on the right wrist   CHOLECYSTECTOMY     INSERTION OF MESH N/A 10/16/2017   Procedure: INSERTION OF MESH;  Surgeon: Donnie Mesa, MD;  Location: [place] [facility];  Service: General;  Laterality: N/A;   IR US GUIDE BX ASP/DRAIN  12/31/2017   ORIF ANKLE FRACTURE Right 11/20/2018   Procedure: OPEN REDUCTION INTERNAL FIXATION (ORIF) ANKLE FRACTURE AND SYNDESMOSIS;  Surgeon: Marchia Bond, MD;  Location: [facility];  Service: Orthopedics;  Laterality: Right;   VENTRAL HERNIA REPAIR N/A 10/16/2017   Procedure: OPEN VENTRAL HERNIA REPAIR WITH MESH;  Surgeon: Donnie Mesa, MD;  Location: [place];  Service: General;  Laterality: N/A;    Family History  Problem Relation Age of Onset   Arthritis Mother    Heart attack Mother    High blood pressure Mother    Cancer Father    Diabetes Father    Arthritis Brother    Arthritis Brother    Arthritis Brother    Diabetes Son     Social History   Socioeconomic History   Marital status: Married    Spouse name: Not on file    Number of children: Not on file   Years of education: Not on file   Highest education level: Not on file  Occupational History   Not on file  Tobacco Use   Smoking status: Never   Smokeless tobacco: Never  Vaping Use   Vaping Use: Never used  Substance and Sexual Activity   Alcohol use: No   Drug use: No   Sexual activity: Yes    Birth control/protection: Surgical  Other Topics Concern   Not on file  Social History Narrative   Not on file   Social Determinants of Health   Financial Resource Strain: Not on file  Food Insecurity: Not on file  Transportation Needs: Not on file  Physical Activity: Not on file  Stress: Not on file  Social Connections: Not on file  Intimate Partner Violence: Not on file    Outpatient Medications Prior to Visit  Medication Sig Dispense Refill   ALPRAZolam (XANAX) 0.5 MG tablet Take 0.5 mg by mouth daily as needed for anxiety.     Ascorbic Acid (VITAMIN C) 100 MG tablet Take 100 mg by mouth daily.     Biotin 1 MG CAPS      cholecalciferol (VITAMIN D3) 25 MCG (1000 UT) tablet Take 2,000 Units by mouth  daily.     Cyanocobalamin (B-12) 2500 MCG TABS Take 2,500 mcg by mouth daily.     loratadine (CLARITIN) 10 MG tablet 1 tablet     Zinc Sulfate (ZINC 15 PO) Take by mouth.     Vitamin E 100 UNIT/GM CREA      No facility-administered medications prior to visit.    Allergies  Allergen Reactions   Latex Other (See Comments)    Tears skin and break out   Sulfa Antibiotics Itching and Nausea And Vomiting    Skin becomes red and feels like it is burning   Sulfasalazine Hives, Itching and Nausea And Vomiting    Skin becomes red and feels like it is burning    Corticosteroids Hives, Itching and Rash    fever   Nitrofurantoin Monohyd Macro Nausea And Vomiting   Penicillins Hives, Itching and Rash    Did it involve swelling of the face/tongue/throat, SOB, or low BP? No Did it involve sudden or severe rash/hives, skin peeling, or any reaction on  the inside of your mouth or nose? No Did you need to seek medical attention at a hospital or doctor's office? No When did it last happen?      5 + years If all above answers are "NO", may proceed with cephalosporin use.     Prednisone Hives, Itching and Other (See Comments)    fever   Tramadol Nausea Only    ROS Review of Systems  Constitutional:  Negative for diaphoresis, fatigue, fever and unexpected weight change.  Genitourinary:  Positive for dysuria, frequency and urgency. Negative for difficulty urinating, flank pain, pelvic pain and vaginal discharge.  Musculoskeletal: Negative.   Psychiatric/Behavioral: Negative.       Objective:    Physical Exam Vitals and nursing note reviewed.  Constitutional:      General: She is not in acute distress.    Appearance: Normal appearance. She is not ill-appearing, toxic-appearing or diaphoretic.  HENT:     Head: Normocephalic and atraumatic.     Right Ear: External ear normal.     Left Ear: External ear normal.     Mouth/Throat:     Mouth: Mucous membranes are moist.     Pharynx: Oropharynx is clear. No oropharyngeal exudate or posterior oropharyngeal erythema.  Eyes:     General: No scleral icterus.       Right eye: No discharge.        Left eye: No discharge.     Extraocular Movements: Extraocular movements intact.     Conjunctiva/sclera: Conjunctivae normal.     Pupils: Pupils are equal, round, and reactive to light.  Cardiovascular:     Rate and Rhythm: Normal rate and regular rhythm.  Pulmonary:     Effort: Pulmonary effort is normal.     Breath sounds: Normal breath sounds.  Abdominal:     General: Bowel sounds are normal.  Skin:    General: Skin is warm and dry.  Neurological:     Mental Status: She is alert and oriented to person, place, and time.  Psychiatric:        Mood and Affect: Mood normal.        Behavior: Behavior normal.    BP 124/76 (BP Location: Left Arm, Patient Position: Sitting, Cuff Size: Large)    Pulse 72   Temp (!) 96.3 F (35.7 C) (Temporal)   Ht '5\' 8"'$  (1.727 m)   Wt 262 lb 9.6 oz (119.1 kg)   SpO2 96%  BMI 39.93 kg/m  Wt Readings from Last 3 Encounters:  03/14/21 262 lb 9.6 oz (119.1 kg)  12/08/20 253 lb 3.2 oz (114.9 kg)  06/07/19 249 lb (112.9 kg)     Health Maintenance Due  Topic Date Due   Pneumococcal Vaccine 86-35 Years old (1 - PCV) Never done   HIV Screening  Never done   Hepatitis C Screening  Never done   Zoster Vaccines- Shingrix (1 of 2) Never done   PAP SMEAR-Modifier  Never done   COLONOSCOPY (Pts 45-34yr Insurance coverage will need to be confirmed)  Never done   MAMMOGRAM  Never done   COVID-19 Vaccine (3 - Pfizer risk series) 12/24/2019   INFLUENZA VACCINE  02/26/2021    There are no preventive care reminders to display for this patient.  No results found for: TSH Lab Results  Component Value Date   WBC 8.7 03/10/2019   HGB 14.1 03/10/2019   HCT 42.8 03/10/2019   MCV 90.9 03/10/2019   PLT 301 03/10/2019   Lab Results  Component Value Date   NA 138 03/10/2019   K 3.5 03/10/2019   CO2 23 03/10/2019   GLUCOSE 111 (H) 03/10/2019   BUN 21 (H) 03/10/2019   CREATININE 0.86 03/10/2019   BILITOT 1.0 03/04/2019   ALKPHOS 108 03/04/2019   AST 19 03/04/2019   ALT 16 03/04/2019   PROT 7.5 03/04/2019   ALBUMIN 4.3 03/04/2019   CALCIUM 9.0 03/10/2019   ANIONGAP 11 03/10/2019   No results found for: CHOL No results found for: HDL No results found for: LDLCALC No results found for: TRIG No results found for: CHOLHDL No results found for: HGBA1C    Assessment & Plan:   Problem List Items Addressed This Visit       Genitourinary   Acute cystitis without hematuria - Primary   Relevant Medications   ciprofloxacin (CIPRO) 500 MG tablet   Other Relevant Orders   Urinalysis, Routine w reflex microscopic (Completed)   Urine Culture (Completed)   Ambulatory referral to Urology    Meds ordered this encounter  Medications    ciprofloxacin (CIPRO) 500 MG tablet    Sig: Take 1 tablet (500 mg total) by mouth 2 (two) times daily for 5 days.    Dispense:  20 tablet    Refill:  0     Follow-up: Return in about 1 week (around 03/21/2021), or if symptoms worsen or fail to improve.    WLibby Maw MD

## 2021-03-15 LAB — URINALYSIS, ROUTINE W REFLEX MICROSCOPIC
Bilirubin Urine: NEGATIVE
Ketones, ur: NEGATIVE
Nitrite: POSITIVE — AB
Specific Gravity, Urine: 1.025 (ref 1.000–1.030)
Total Protein, Urine: NEGATIVE
Urine Glucose: NEGATIVE
Urobilinogen, UA: 0.2 (ref 0.0–1.0)
pH: 6 (ref 5.0–8.0)

## 2021-03-16 LAB — URINE CULTURE
MICRO NUMBER:: 12255308
SPECIMEN QUALITY:: ADEQUATE

## 2021-03-19 NOTE — Addendum Note (Signed)
Addended by: Jon Billings on: 03/19/2021 07:53 AM   Modules accepted: Orders

## 2021-05-15 ENCOUNTER — Encounter: Payer: Self-pay | Admitting: Family Medicine

## 2021-05-15 ENCOUNTER — Telehealth (INDEPENDENT_AMBULATORY_CARE_PROVIDER_SITE_OTHER): Payer: No Typology Code available for payment source | Admitting: Family Medicine

## 2021-05-15 VITALS — BP 112/79 | Wt 256.0 lb

## 2021-05-15 DIAGNOSIS — R0981 Nasal congestion: Secondary | ICD-10-CM | POA: Diagnosis not present

## 2021-05-15 DIAGNOSIS — R059 Cough, unspecified: Secondary | ICD-10-CM

## 2021-05-15 MED ORDER — BENZONATATE 200 MG PO CAPS: 200.0000 mg | ORAL_CAPSULE | Freq: Two times a day (BID) | ORAL | 0 refills | Status: DC | PRN

## 2021-05-15 MED ORDER — ALBUTEROL SULFATE HFA 108 (90 BASE) MCG/ACT IN AERS: 2.0000 | INHALATION_SPRAY | Freq: Four times a day (QID) | RESPIRATORY_TRACT | 0 refills | Status: DC | PRN

## 2021-05-15 NOTE — Patient Instructions (Addendum)
  HOME CARE TIPS:  -COVID19 testing information: ForwardDrop.tn  Most pharmacies also offer testing and home test kits. If the Covid19 test is positive and you desire antiviral treatment, please contact a [facility] or schedule a follow up virtual visit through your primary care office or through the [facility].  Other test to treat options: ConnectRV.is?[email or web address]  -I sent the medication(s) we discussed to your pharmacy: Meds ordered this encounter  Medications   benzonatate (TESSALON) 200 MG capsule    Sig: Take 1 capsule (200 mg total) by mouth 2 (two) times daily as needed.    Dispense:  20 capsule    Refill:  0   albuterol (PROAIR HFA) 108 (90 Base) MCG/ACT inhaler    Sig: Inhale 2 puffs into the lungs every 6 (six) hours as needed for wheezing or shortness of breath.    Dispense:  1 each    Refill:  0     -can use tylenol or aleve if needed for fevers, aches and pains per instructions  -can use nasal saline a few times per day if you have nasal congestion; sometimes  a short course of Afrin nasal spray for 3 days can help with symptoms as well  -stay hydrated, drink plenty of fluids and eat small healthy meals - avoid dairy  -can take 1000 IU (4mcg) Vit D3 and 100-500 mg of Vit C daily per instructions  -If the Covid test is positive, check out the [facility] website for more information on home care, transmission and treatment for COVID19  -follow up with your doctor in 2-3 days unless improving and feeling better  -stay home while sick, except to seek medical care. If you have COVID19, ideally it would be best to stay home for a full 10 days since the onset of symptoms PLUS one day of no fever and feeling better. Wear a good mask that fits snugly (such as N95 or KN95) if around others to reduce the risk of transmission.  It was nice to meet you today, and I really hope you are feeling better soon. I help  [facility] out with telemedicine visits on Tuesdays and Thursdays and am available for visits on those days. If you have any concerns or questions following this visit please schedule a follow up visit with your Primary Care doctor or seek care at a local urgent care clinic to avoid delays in care.    Seek in person care or schedule a follow up video visit promptly if your symptoms worsen, new concerns arise, the ear is not improving or you are not improving with treatment. Call 911 and/or seek emergency care if your symptoms are severe or life threatening.

## 2021-05-15 NOTE — Progress Notes (Signed)
Virtual Visit via Video Note  I connected with Dawn Mcguire  on 05/15/21 at 11:40 AM EDT by a video enabled telemedicine application and verified that I am speaking with the correct person using two identifiers.  Location patient: home, [place] Location provider:work or home office Persons participating in the virtual visit: patient, provider  I discussed the limitations of evaluation and management by telemedicine and the availability of in person appointments. The patient expressed understanding and agreed to proceed.   HPI:  Acute telemedicine visit for sinus issues: -Onset: yesterday, has some ear issues for a few days before -grandchild was sick before her with a "cold" -home test for covid was negative today -Symptoms include:nasal congestion, cough, R ear discomfort intermittently, sneezing -Denies:fevers, body child, CP (except when coughs), SOB, NVD, inability to eat/drink/get out of bed -Pertinent past medical history: reports a history of bronchitis and feels she always gets bronchitis when sick -Pertinent medication allergies: Allergies  Allergen Reactions   Latex Other (See Comments)    Tears skin and break out   Sulfa Antibiotics Itching and Nausea And Vomiting    Skin becomes red and feels like it is burning   Sulfasalazine Hives, Itching and Nausea And Vomiting    Skin becomes red and feels like it is burning    Corticosteroids Hives, Itching and Rash    fever   Nitrofurantoin Monohyd Macro Nausea And Vomiting   Penicillins Hives, Itching and Rash    Did it involve swelling of the face/tongue/throat, SOB, or low BP? No Did it involve sudden or severe rash/hives, skin peeling, or any reaction on the inside of your mouth or nose? No Did you need to seek medical attention at a hospital or doctor's office? No When did it last happen?      5 + years If all above answers are "NO", may proceed with cephalosporin use.     Prednisone Hives, Itching and Other (See Comments)     fever   Tramadol Nausea Only  -COVID-19 vaccine status: Immunization History  Administered Date(s) Administered   Influenza-Unspecified 05/07/2020   PFIZER(Purple Top)SARS-COV-2 Vaccination 10/30/2019, 11/26/2019   Tdap 08/10/2019     ROS: See pertinent positives and negatives per HPI.  Past Medical History:  Diagnosis Date   Arthritis    Carpal tunnel syndrome of right wrist    Closed displaced trimalleolar fracture of right ankle 11/20/2018   GERD (gastroesophageal reflux disease)    Psoriasis     Past Surgical History:  Procedure Laterality Date   ABDOMINAL HYSTERECTOMY     BLADDER REPAIR     BREAST SURGERY     CARPAL TUNNEL RELEASE     on the right wrist   CHOLECYSTECTOMY     INSERTION OF MESH N/A 10/16/2017   Procedure: INSERTION OF MESH;  Surgeon: Donnie Mesa, MD;  Location: [facility];  Service: General;  Laterality: N/A;   IR US GUIDE BX ASP/DRAIN  12/31/2017   ORIF ANKLE FRACTURE Right 11/20/2018   Procedure: OPEN REDUCTION INTERNAL FIXATION (ORIF) ANKLE FRACTURE AND SYNDESMOSIS;  Surgeon: Marchia Bond, MD;  Location: [facility];  Service: Orthopedics;  Laterality: Right;   VENTRAL HERNIA REPAIR N/A 10/16/2017   Procedure: OPEN VENTRAL HERNIA REPAIR WITH MESH;  Surgeon: Donnie Mesa, MD;  Location: [facility];  Service: General;  Laterality: N/A;     Current Outpatient Medications:    albuterol (PROAIR HFA) 108 (90 Base) MCG/ACT inhaler, Inhale 2 puffs into the lungs every 6 (six) hours as needed  for wheezing or shortness of breath., Disp: 1 each, Rfl: 0   ALPRAZolam (XANAX) 0.5 MG tablet, Take 0.5 mg by mouth daily as needed for anxiety., Disp: , Rfl:    Ascorbic Acid (VITAMIN C) 100 MG tablet, Take 100 mg by mouth daily., Disp: , Rfl:    benzonatate (TESSALON) 200 MG capsule, Take 1 capsule (200 mg total) by mouth 2 (two) times daily as needed., Disp: 20 capsule, Rfl: 0   Biotin 1 MG CAPS, , Disp: , Rfl:    cholecalciferol (VITAMIN D3) 25 MCG (1000 UT)  tablet, Take 2,000 Units by mouth daily., Disp: , Rfl:    Cyanocobalamin (B-12) 2500 MCG TABS, Take 2,500 mcg by mouth daily., Disp: , Rfl:    loratadine (CLARITIN) 10 MG tablet, 1 tablet, Disp: , Rfl:    omeprazole (PRILOSEC) 20 MG capsule, Take 20 mg by mouth daily., Disp: , Rfl:    Zinc Sulfate (ZINC 15 PO), Take by mouth., Disp: , Rfl:   EXAM:  VITALS per patient if applicable:  GENERAL: alert, oriented, appears well and in no acute distress  HEENT: atraumatic, conjunttiva clear, no obvious abnormalities on inspection of external nose and ears  NECK: normal movements of the head and neck  LUNGS: on inspection no signs of respiratory distress, breathing rate appears normal, no obvious gross SOB, gasping or wheezing  CV: no obvious cyanosis  MS: moves all visible extremities without noticeable abnormality  PSYCH/NEURO: pleasant and cooperative, no obvious depression or anxiety, speech and thought processing grossly intact  ASSESSMENT AND PLAN:  Discussed the following assessment and plan:  Cough, unspecified type  Nasal congestion  -we discussed possible serious and likely etiologies, options for evaluation and workup, limitations of telemedicine visit vs in person visit, treatment, treatment risks and precautions. Pt is agreeable to treatment via telemedicine at this moment. Query VURI, covid19 vs other. She had a negative covid test today and agrees to retest in 24 hours - discussed could call [facility] pharmacy or do virtual follow up if positive, in first 5 days and desires treatment.  Opted for treatment with Tessalon for cough, nasal saline, short course nasal decongestant and albuterol if needed given her report of bronchitis with respiratory illnesses.  Advised of proper use. Advised to seek prompt in person care if worsening, new symptoms arise, ear is not improving or worsening or if is not improving with treatment. Discussed options for inperson care if PCP office not  available. Did let this patient know that I only do telemedicine on Tuesdays and Thursdays for [place]. Advised to schedule follow up visit with PCP or [facility] if any further questions or concerns to avoid delays in care.   I discussed the assessment and treatment plan with the patient. The patient was provided an opportunity to ask questions and all were answered. The patient agreed with the plan and demonstrated an understanding of the instructions.     Lucretia Kern, DO

## 2021-07-19 ENCOUNTER — Encounter: Payer: Self-pay | Admitting: Nurse Practitioner

## 2021-07-19 ENCOUNTER — Ambulatory Visit (INDEPENDENT_AMBULATORY_CARE_PROVIDER_SITE_OTHER)
Admission: RE | Admit: 2021-07-19 | Discharge: 2021-07-19 | Disposition: A | Discharge status: Home / Self Care | Payer: No Typology Code available for payment source | Source: Ambulatory Visit | Attending: Nurse Practitioner | Admitting: Nurse Practitioner

## 2021-07-19 ENCOUNTER — Other Ambulatory Visit: Payer: Self-pay

## 2021-07-19 ENCOUNTER — Ambulatory Visit (INDEPENDENT_AMBULATORY_CARE_PROVIDER_SITE_OTHER): Payer: No Typology Code available for payment source | Admitting: Nurse Practitioner

## 2021-07-19 ENCOUNTER — Telehealth: Payer: Self-pay

## 2021-07-19 VITALS — BP 110/84 | HR 63 | Temp 97.9°F | Resp 14 | Ht 68.0 in | Wt 254.2 lb

## 2021-07-19 DIAGNOSIS — R5383 Other fatigue: Secondary | ICD-10-CM | POA: Diagnosis not present

## 2021-07-19 DIAGNOSIS — R051 Acute cough: Secondary | ICD-10-CM

## 2021-07-19 DIAGNOSIS — Z760 Encounter for issue of repeat prescription: Secondary | ICD-10-CM | POA: Diagnosis not present

## 2021-07-19 DIAGNOSIS — H6981 Other specified disorders of Eustachian tube, right ear: Secondary | ICD-10-CM

## 2021-07-19 DIAGNOSIS — G4489 Other headache syndrome: Secondary | ICD-10-CM

## 2021-07-19 LAB — POC COVID19 BINAXNOW: SARS Coronavirus 2 Ag: NEGATIVE

## 2021-07-19 MED ORDER — ALBUTEROL SULFATE HFA 108 (90 BASE) MCG/ACT IN AERS: 2.0000 | INHALATION_SPRAY | Freq: Four times a day (QID) | RESPIRATORY_TRACT | 0 refills | Status: DC | PRN

## 2021-07-19 NOTE — Progress Notes (Signed)
Acute Office Visit  Subjective:    Patient ID: Dawn Mcguire, female    DOB: 10-29-1965, 55 y.o.   MRN: [id number]  Chief Complaint  Patient presents with   Post nasal drainage    Off and on for 2 months has had a cold per patient, some swollen lymph nodes on the right neck area, cough, right ear pain. Has been taking Mucinex, Claritin. No fever or sore throat. Raspy voice, sometimes wheezing,    HPI Patient is in today for   Was seen in oct by telehealth service. Dx with viral illness and recovered States that her grandson had a cold or something in November or late October. She caught that and has since recovered  A few week later she got sick again and was in and out of the hospital with her MIL in and out approx 4 times. Recovered from illness  Symptoms started yesterday. States she is coughing up yellow and green. Nose is yellow and green Antihistamine and decongested. Has been taking mucinex. History of walking pna. Past Medical History:  Diagnosis Date   Arthritis    Carpal tunnel syndrome of right wrist    Closed displaced trimalleolar fracture of right ankle 11/20/2018   GERD (gastroesophageal reflux disease)    Psoriasis     Past Surgical History:  Procedure Laterality Date   ABDOMINAL HYSTERECTOMY     BLADDER REPAIR     BREAST SURGERY     CARPAL TUNNEL RELEASE     on the right wrist   CHOLECYSTECTOMY     INSERTION OF MESH N/A 10/16/2017   Procedure: INSERTION OF MESH;  Surgeon: Donnie Mesa, MD;  Location: [facility];  Service: General;  Laterality: N/A;   IR US GUIDE BX ASP/DRAIN  12/31/2017   ORIF ANKLE FRACTURE Right 11/20/2018   Procedure: OPEN REDUCTION INTERNAL FIXATION (ORIF) ANKLE FRACTURE AND SYNDESMOSIS;  Surgeon: Marchia Bond, MD;  Location: [facility];  Service: Orthopedics;  Laterality: Right;   VENTRAL HERNIA REPAIR N/A 10/16/2017   Procedure: OPEN VENTRAL HERNIA REPAIR WITH MESH;  Surgeon: Donnie Mesa, MD;  Location: [facility];   Service: General;  Laterality: N/A;    Family History  Problem Relation Age of Onset   Arthritis Mother    Heart attack Mother    High blood pressure Mother    Cancer Father    Diabetes Father    Arthritis Brother    Arthritis Brother    Arthritis Brother    Diabetes Son     Social History   Socioeconomic History   Marital status: Married    Spouse name: Not on file   Number of children: Not on file   Years of education: Not on file   Highest education level: Not on file  Occupational History   Not on file  Tobacco Use   Smoking status: Never   Smokeless tobacco: Never  Vaping Use   Vaping Use: Never used  Substance and Sexual Activity   Alcohol use: No   Drug use: No   Sexual activity: Yes    Birth control/protection: Surgical  Other Topics Concern   Not on file  Social History Narrative   Not on file   Social Determinants of Health   Financial Resource Strain: Not on file  Food Insecurity: Not on file  Transportation Needs: Not on file  Physical Activity: Not on file  Stress: Not on file  Social Connections: Not on file  Intimate Partner Violence: Not on  file    Outpatient Medications Prior to Visit  Medication Sig Dispense Refill   albuterol (PROAIR HFA) 108 (90 Base) MCG/ACT inhaler Inhale 2 puffs into the lungs every 6 (six) hours as needed for wheezing or shortness of breath. 1 each 0   ALPRAZolam (XANAX) 0.5 MG tablet Take 0.5 mg by mouth daily as needed for anxiety.     Ascorbic Acid (VITAMIN C) 100 MG tablet Take 100 mg by mouth daily.     Biotin 1 MG CAPS      cholecalciferol (VITAMIN D3) 25 MCG (1000 UT) tablet Take 2,000 Units by mouth daily.     Cyanocobalamin (B-12) 2500 MCG TABS Take 2,500 mcg by mouth daily.     loratadine (CLARITIN) 10 MG tablet 1 tablet     omeprazole (PRILOSEC) 20 MG capsule Take 20 mg by mouth daily.     Zinc Sulfate (ZINC 15 PO) Take by mouth.     benzonatate (TESSALON) 200 MG capsule Take 1 capsule (200 mg total) by  mouth 2 (two) times daily as needed. 20 capsule 0   No facility-administered medications prior to visit.    Allergies  Allergen Reactions   Latex Other (See Comments)    Tears skin and break out   Sulfa Antibiotics Itching and Nausea And Vomiting    Skin becomes red and feels like it is burning   Sulfasalazine Hives, Itching and Nausea And Vomiting    Skin becomes red and feels like it is burning    Corticosteroids Hives, Itching and Rash    fever   Nitrofurantoin Monohyd Macro Nausea And Vomiting   Penicillins Hives, Itching and Rash    Did it involve swelling of the face/tongue/throat, SOB, or low BP? No Did it involve sudden or severe rash/hives, skin peeling, or any reaction on the inside of your mouth or nose? No Did you need to seek medical attention at a hospital or doctor's office? No When did it last happen?      5 + years If all above answers are NO, may proceed with cephalosporin use.     Prednisone Hives, Itching and Other (See Comments)    fever   Tramadol Nausea Only    Review of Systems  Constitutional:  Positive for fatigue. Negative for chills and fever.  HENT:  Positive for ear pain (right ear that feels full), postnasal drip and sneezing. Negative for congestion, sinus pressure and sinus pain.   Respiratory:  Positive for cough (yellow and green). Negative for shortness of breath.   Cardiovascular:  Negative for chest pain.  Gastrointestinal:  Positive for nausea (last night that has passed). Negative for abdominal pain, diarrhea and vomiting.  Musculoskeletal:  Negative for arthralgias and myalgias.  Neurological:  Positive for headaches.      Objective:    Physical Exam Vitals and nursing note reviewed.  Constitutional:      Appearance: Normal appearance.  HENT:     Right Ear: Ear canal and external ear normal.     Left Ear: Tympanic membrane, ear canal and external ear normal.     Ears:     Comments: Clear fluid behind right TM     Nose:  Congestion present.     Right Sinus: No maxillary sinus tenderness or frontal sinus tenderness.     Left Sinus: No maxillary sinus tenderness or frontal sinus tenderness.     Mouth/Throat:     Mouth: Mucous membranes are moist.     Comments: [place]bl[place]  Cardiovascular:     Rate and Rhythm: Normal rate.  Pulmonary:     Effort: Pulmonary effort is normal.     Breath sounds: Normal breath sounds.  Abdominal:     General: Bowel sounds are normal.  Lymphadenopathy:     Cervical: Cervical adenopathy present.  Neurological:     Mental Status: She is alert.    There were no vitals taken for this visit. Wt Readings from Last 3 Encounters:  07/19/21 254 lb 4 oz (115.3 kg)  05/15/21 256 lb (116.1 kg)  03/14/21 262 lb 9.6 oz (119.1 kg)    Health Maintenance Due  Topic Date Due   Pneumococcal Vaccine 44-41 Years old (1 - PCV) Never done   HIV Screening  Never done   Hepatitis C Screening  Never done   Zoster Vaccines- Shingrix (1 of 2) Never done   COLONOSCOPY (Pts 45-59yrs Insurance coverage will need to be confirmed)  Never done   COVID-19 Vaccine (3 - Pfizer risk series) 12/24/2019    There are no preventive care reminders to display for this patient.   No results found for: TSH Lab Results  Component Value Date   WBC 8.7 03/10/2019   HGB 14.1 03/10/2019   HCT 42.8 03/10/2019   MCV 90.9 03/10/2019   PLT 301 03/10/2019   Lab Results  Component Value Date   NA 138 03/10/2019   K 3.5 03/10/2019   CO2 23 03/10/2019   GLUCOSE 111 (H) 03/10/2019   BUN 21 (H) 03/10/2019   CREATININE 0.86 03/10/2019   BILITOT 1.0 03/04/2019   ALKPHOS 108 03/04/2019   AST 19 03/04/2019   ALT 16 03/04/2019   PROT 7.5 03/04/2019   ALBUMIN 4.3 03/04/2019   CALCIUM 9.0 03/10/2019   ANIONGAP 11 03/10/2019   No results found for: CHOL No results found for: HDL No results found for: LDLCALC No results found for: TRIG No results found for: CHOLHDL No results found for: HGBA1C      Assessment & Plan:   Problem List Items Addressed This Visit       Nervous and Auditory   Eustachian tube dysfunction, right    Offered to send in some topical nasal corticosteroids patient declined states they will make a nosebleed discussed use of Sudafed to be beneficial along with a short course of Afrin.  Did tell patient Afrin used 2 days and to discontinue.  Patient does have allergy to steroids.        Other   Medication refill    Asked for refill on her albuterol inhaler as this helps her when she has illness.  Refill sent in      Relevant Medications   albuterol (PROAIR HFA) 108 (90 Base) MCG/ACT inhaler   Other headache syndrome    Over-the-counter analgesics as needed COVID test negative in office.      Relevant Orders   POC COVID-19 (Completed)   Other fatigue    Rest, drink plenty of fluids, eat as tolerated.      Relevant Orders   POC COVID-19 (Completed)   Acute cough - Primary    Continue over-the-counter regimens currently.  Chest x-ray came back with no acute cardiopulmonary abnormality.  Patient is overly concerned and feels like she needs antibiotic as she knows this is either a bronchitis or something infectious.  Did explain to her that I think antibiotic will be useful at this point in time.  She will update me tomorrow before close of office about her  symptoms we discussed writing an antibiotic and holding it at the pharmacy if she gets worse over the weekend or on Monday.  We will discussed this with patient after she updates me in the morning.      Relevant Orders   POC COVID-19 (Completed)   DG Chest 2 View (Completed)     No orders of the defined types were placed in this encounter.  This visit occurred during the SARS-CoV-2 public health emergency.  Safety protocols were in place, including screening questions prior to the visit, additional usage of staff PPE, and extensive cleaning of exam room while observing appropriate contact time as  indicated for disinfecting solutions.   Romilda Garret, NP

## 2021-07-19 NOTE — Assessment & Plan Note (Signed)
Offered to send in some topical nasal corticosteroids patient declined states they will make a nosebleed discussed use of Sudafed to be beneficial along with a short course of Afrin.  Did tell patient Afrin used 2 days and to discontinue.  Patient does have allergy to steroids.

## 2021-07-19 NOTE — Patient Instructions (Signed)
Afrin 2 sprays each nostril twice daily. But it should say this on the bottle. Do not use more than 2 days. You can also use pseudoephedrine I will be in touch in regards to your chest xray

## 2021-07-19 NOTE — Assessment & Plan Note (Addendum)
Continue over-the-counter regimens currently.  Chest x-ray came back with no acute cardiopulmonary abnormality.  Patient is overly concerned and feels like she needs antibiotic as she knows this is either a bronchitis or something infectious.  Did explain to her that I think antibiotic will be useful at this point in time.  She will update me tomorrow before close of office about her symptoms we discussed writing an antibiotic and holding it at the pharmacy if she gets worse over the weekend or on Monday.  We will discussed this with patient after she updates me in the morning.

## 2021-07-19 NOTE — Telephone Encounter (Signed)
Patient was seen by Dawn Mcguire for a acute visit today and asked to have a RX for Omeprazole. She gets this OTC but is more expensive that way. She also is asking to have it filled for 40 mg daily. She usually takes OTC 20 mg 1 twice daily. Please review. Thank you

## 2021-07-19 NOTE — Assessment & Plan Note (Signed)
Over-the-counter analgesics as needed COVID test negative in office.

## 2021-07-19 NOTE — Assessment & Plan Note (Signed)
Asked for refill on her albuterol inhaler as this helps her when she has illness.  Refill sent in

## 2021-07-19 NOTE — Assessment & Plan Note (Signed)
Rest, drink plenty of fluids, eat as tolerated.

## 2021-07-23 MED ORDER — OMEPRAZOLE 40 MG PO CPDR: 40.0000 mg | DELAYED_RELEASE_CAPSULE | Freq: Every day | ORAL | 3 refills | Status: DC

## 2021-07-23 NOTE — Addendum Note (Signed)
Addended by: Jon Billings on: 07/23/2021 05:01 PM   Modules accepted: Orders

## 2021-09-04 ENCOUNTER — Other Ambulatory Visit: Payer: Self-pay

## 2021-09-04 ENCOUNTER — Ambulatory Visit (INDEPENDENT_AMBULATORY_CARE_PROVIDER_SITE_OTHER): Payer: No Typology Code available for payment source | Admitting: Family Medicine

## 2021-09-04 VITALS — BP 130/76 | HR 79 | Temp 96.7°F | Ht 68.0 in | Wt 256.8 lb

## 2021-09-04 DIAGNOSIS — E669 Obesity, unspecified: Secondary | ICD-10-CM | POA: Diagnosis not present

## 2021-09-04 DIAGNOSIS — M5441 Lumbago with sciatica, right side: Secondary | ICD-10-CM

## 2021-09-04 DIAGNOSIS — M5442 Lumbago with sciatica, left side: Secondary | ICD-10-CM

## 2021-09-04 MED ORDER — TIZANIDINE HCL 4 MG PO TABS: 4.0000 mg | ORAL_TABLET | Freq: Three times a day (TID) | ORAL | 1 refills | Status: DC

## 2021-09-04 MED ORDER — NAPROXEN 500 MG PO TABS: 500.0000 mg | ORAL_TABLET | Freq: Two times a day (BID) | ORAL | 0 refills | Status: DC

## 2021-09-04 NOTE — Patient Instructions (Signed)

## 2021-09-04 NOTE — Progress Notes (Signed)
[facility] [address] [place] [address] Dept: [phone] Dept Fax: [phone]  Office Visit  Subjective:    Patient ID: Dawn Mcguire, female    DOB: 05/06/1966, 56 y.o..   MRN: [id number]  Chief Complaint  Patient presents with   Acute Visit    C/o having back spasms (waist up to rib cage) x 4 days.  She has been using Aleve, heating pad and pain patches.      History of Present Illness:  Patient is in today for evaluation of low to mid right back pain for 4 days. She does not recall any specific injury. 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 She feels she may have pulled a muscle, as she describes feeling spasms over the back muscles, extending up to her rib cage. She has [place] had a flare of past sciatica, with pain extending down into the buttocks. She has not had any issue with new areas of numbness or weakness in either leg, any fever, unintended weight loss, or bowel/bladder incontinence. She notes she has a known history of degenerative disc issues, but this is lower down in the back than where she is experiencing her current pain.  Dawn Mcguire also brings up concerns for her weight. She notes struggles she has had with weight loss. She feels she follows a healthy diet. She only eats 2 meals a day and limits her intake between 11 am and 7:30 pm. She does go tot he gym and exercise. Despite this, she has not seen any weight loss. She wonders about medication assistance for weight loss.  Past Medical History: Patient Active Problem List   Diagnosis Date Noted   Medication refill 07/19/2021   Other headache syndrome 07/19/2021   Other fatigue 07/19/2021   Acute cough 07/19/2021   Acute cystitis without hematuria 03/14/2021   Morbid obesity ([place]) 12/08/2020   Acute non-recurrent maxillary sinusitis 10/02/2020   Anal fissure 06/07/2019   Hematuria  06/07/2019   Left foot pain 02/11/2019   Closed displaced trimalleolar fracture of right ankle 11/20/2018   Nonspecific syndrome suggestive of viral illness 11/16/2018   Bad odor of urine 06/08/2018   Acute non-recurrent frontal sinusitis 06/08/2018   Suprapubic pressure 06/08/2018   Eustachian tube dysfunction, right 06/08/2018   Drug allergy, multiple 06/08/2018   Rhus dermatitis 01/20/2018   Past Surgical History:  Procedure Laterality Date   ABDOMINAL HYSTERECTOMY     BLADDER REPAIR     BREAST SURGERY     CARPAL TUNNEL RELEASE     on the right wrist   CHOLECYSTECTOMY     INSERTION OF MESH N/A 10/16/2017   Procedure: INSERTION OF MESH;  Surgeon: Donnie Mesa, MD;  Location: [place] [facility];  Service: General;  Laterality: N/A;   IR US GUIDE BX ASP/DRAIN  12/31/2017   ORIF ANKLE FRACTURE Right 11/20/2018   Procedure: OPEN REDUCTION INTERNAL FIXATION (ORIF) ANKLE FRACTURE AND SYNDESMOSIS;  Surgeon: Marchia Bond, MD;  Location: [place];  Service: Orthopedics;  Laterality: Right;   VENTRAL HERNIA REPAIR N/A 10/16/2017   Procedure: OPEN VENTRAL HERNIA REPAIR WITH MESH;  Surgeon: Donnie Mesa, MD;  Location: [place];  Service: General;  Laterality: N/A;   Family History  Problem Relation Age of Onset   Arthritis Mother    Heart attack Mother    High blood pressure Mother    Cancer Father    Diabetes  Father    Arthritis Brother    Arthritis Brother    Arthritis Brother    Diabetes Son    Outpatient Medications Prior to Visit  Medication Sig Dispense Refill   albuterol (PROAIR HFA) 108 (90 Base) MCG/ACT inhaler Inhale 2 puffs into the lungs every 6 (six) hours as needed for wheezing or shortness of breath. 1 each 0   ALPRAZolam (XANAX) 0.5 MG tablet Take 0.5 mg by mouth daily as needed for anxiety.     Ascorbic Acid (VITAMIN C) 100 MG tablet Take 100 mg by mouth daily.     Biotin 1 MG CAPS      cholecalciferol (VITAMIN D3) 25 MCG (1000 UT) tablet Take 2,000 Units by  mouth daily.     Cyanocobalamin (B-12) 2500 MCG TABS Take 2,500 mcg by mouth daily.     loratadine (CLARITIN) 10 MG tablet 1 tablet     omeprazole (PRILOSEC) 40 MG capsule Take 1 capsule (40 mg total) by mouth daily. 30 capsule 3   Zinc Sulfate (ZINC 15 PO) Take by mouth.     No facility-administered medications prior to visit.   Allergies  Allergen Reactions   Latex Other (See Comments)    Tears skin and break out   Sulfa Antibiotics Itching and Nausea And Vomiting    Skin becomes red and feels like it is burning   Sulfasalazine Hives, Itching and Nausea And Vomiting    Skin becomes red and feels like it is burning    Corticosteroids Hives, Itching and Rash    fever   Nitrofurantoin Monohyd Macro Nausea And Vomiting   Penicillins Hives, Itching and Rash    Did it involve swelling of the face/tongue/throat, SOB, or low BP? No Did it involve sudden or severe rash/hives, skin peeling, or any reaction on the inside of your mouth or nose? No Did you need to seek medical attention at a hospital or doctor's office? No When did it last happen?      5 + years If all above answers are NO, may proceed with cephalosporin use.     Prednisone Hives, Itching and Other (See Comments)    fever   Tramadol Nausea Only    Objective:   Today's Vitals   09/04/21 1536  BP: 130/76  Pulse: 79  Temp: (!) 96.7 F (35.9 C)  TempSrc: Temporal  SpO2: 97%  Weight: 256 lb 12.8 oz (116.5 kg)  Height: 5\' 8"  (1.727 m)   Body mass index is 39.05 kg/m.   General: Well developed, well nourished. No acute distress. Back: Straight. Mild tenderness over the right paraspinal muscles int he upper lumbar   area. Extremities: Strength 5/5 bilat. Neuro: Normal sensation and DTR 2+ bilaterally. Psych: Alert and oriented. Normal mood and affect.  Health Maintenance Due  Topic Date Due   HIV Screening  Never done   Hepatitis C Screening  Never done   COLONOSCOPY (Pts 45-33yrs Insurance coverage will  need to be confirmed)  Never done   Zoster Vaccines- Shingrix (1 of 2) Never done   COVID-19 Vaccine (3 - Booster for Pfizer series) 01/21/2020     Assessment & Plan:   1. Acute right-sided low back pain with bilateral sciatica Dawn Mcguire's back pain is likely from a  pulled muscle. I recommend she follow relative rest (avoid picking up her grandson), use stretches, heat, and take an antiinflammatory. I will also add some Zanaflex for spasm. If not improving in the next 10 days, she shodul  follow up with Dr. Ethelene Hal.  - tiZANidine (ZANAFLEX) 4 MG tablet; Take 1 tablet (4 mg total) by mouth 3 (three) times daily.  Dispense: 30 tablet; Refill: 1 - naproxen (NAPROSYN) 500 MG tablet; Take 1 tablet (500 mg total) by mouth 2 (two) times daily with a meal.  Dispense: 30 tablet; Refill: 0  2. Obesity (BMI 35.0-39.9 without comorbidity) We briefly discussed approaches to weight loss. She may be a candidate for medication assistance for weight loss. I recommend she return to discuss this further with her PCP.  Haydee Salter, MD

## 2021-09-21 ENCOUNTER — Telehealth: Payer: Self-pay | Admitting: Family Medicine

## 2021-09-21 ENCOUNTER — Ambulatory Visit: Payer: No Typology Code available for payment source | Admitting: Family Medicine

## 2021-09-21 NOTE — Telephone Encounter (Signed)
Patient/Caregiver was notified of No Show/Late Cancellation Policy & possible $64 charge. Visit was cancelled with reason "No Show/Cancel within 24 hours" for tracking & charging.  Caller Name: Kaylen Motl  Caller Ph #: [phone] Date of APPT: 09/21/21 Reason given for no show/late cancellation: Child has stomach bug, resched.  No Show Letter printed & put in outgoing mail (Yes/No): no  ~~~Route message to admin supervisor and clinical team/CMA~~~

## 2021-09-24 ENCOUNTER — Ambulatory Visit: Payer: No Typology Code available for payment source | Admitting: Family Medicine

## 2021-09-24 ENCOUNTER — Telehealth: Payer: Self-pay | Admitting: Family Medicine

## 2021-09-24 NOTE — Telephone Encounter (Signed)
Pt cancelled appointment. She has had diarrhea, and feeling very sick, I let her know the No Show fee policy. She rescheduled, but was not happy.

## 2021-09-24 NOTE — Telephone Encounter (Signed)
I mailed her a No Show letter. I see she did this on Friday 09/21/21

## 2021-09-27 ENCOUNTER — Ambulatory Visit (INDEPENDENT_AMBULATORY_CARE_PROVIDER_SITE_OTHER): Payer: No Typology Code available for payment source | Admitting: Family Medicine

## 2021-09-27 ENCOUNTER — Other Ambulatory Visit: Payer: Self-pay

## 2021-09-27 ENCOUNTER — Encounter: Payer: Self-pay | Admitting: Family Medicine

## 2021-09-27 VITALS — BP 116/70 | HR 69 | Temp 97.4°F | Ht 68.0 in | Wt 256.6 lb

## 2021-09-27 DIAGNOSIS — E669 Obesity, unspecified: Secondary | ICD-10-CM | POA: Diagnosis not present

## 2021-09-27 DIAGNOSIS — L659 Nonscarring hair loss, unspecified: Secondary | ICD-10-CM | POA: Insufficient documentation

## 2021-09-27 DIAGNOSIS — L409 Psoriasis, unspecified: Secondary | ICD-10-CM | POA: Insufficient documentation

## 2021-09-27 DIAGNOSIS — D485 Neoplasm of uncertain behavior of skin: Secondary | ICD-10-CM | POA: Insufficient documentation

## 2021-09-27 DIAGNOSIS — Z0000 Encounter for general adult medical examination without abnormal findings: Secondary | ICD-10-CM

## 2021-09-27 DIAGNOSIS — M2550 Pain in unspecified joint: Secondary | ICD-10-CM | POA: Insufficient documentation

## 2021-09-27 DIAGNOSIS — Z1211 Encounter for screening for malignant neoplasm of colon: Secondary | ICD-10-CM

## 2021-09-27 LAB — URINALYSIS, ROUTINE W REFLEX MICROSCOPIC
Bilirubin Urine: NEGATIVE
Ketones, ur: NEGATIVE
Leukocytes,Ua: NEGATIVE
Nitrite: NEGATIVE
Specific Gravity, Urine: 1.02 (ref 1.000–1.030)
Total Protein, Urine: NEGATIVE
Urine Glucose: NEGATIVE
Urobilinogen, UA: 0.2 (ref 0.0–1.0)
pH: 6 (ref 5.0–8.0)

## 2021-09-27 LAB — CBC
HCT: 42 % (ref 36.0–46.0)
Hemoglobin: 14 g/dL (ref 12.0–15.0)
MCHC: 33.4 g/dL (ref 30.0–36.0)
MCV: 90.9 fl (ref 78.0–100.0)
Platelets: 268 10*3/uL (ref 150.0–400.0)
RBC: 4.62 Mil/uL (ref 3.87–5.11)
RDW: 13.6 % (ref 11.5–15.5)
WBC: 8.6 10*3/uL (ref 4.0–10.5)

## 2021-09-27 LAB — COMPREHENSIVE METABOLIC PANEL
ALT: 23 U/L (ref 0–35)
AST: 16 U/L (ref 0–37)
Albumin: 4.3 g/dL (ref 3.5–5.2)
Alkaline Phosphatase: 95 U/L (ref 39–117)
BUN: 14 mg/dL (ref 6–23)
CO2: 30 mEq/L (ref 19–32)
Calcium: 9.3 mg/dL (ref 8.4–10.5)
Chloride: 103 mEq/L (ref 96–112)
Creatinine, Ser: 0.83 mg/dL (ref 0.40–1.20)
GFR: 77.91 mL/min (ref >60.00)
Glucose, Bld: 93 mg/dL (ref 70–99)
Potassium: 4 mEq/L (ref 3.5–5.1)
Sodium: 141 mEq/L (ref 135–145)
Total Bilirubin: 0.6 mg/dL (ref 0.2–1.2)
Total Protein: 7 g/dL (ref 6.0–8.3)

## 2021-09-27 LAB — LIPID PANEL
Cholesterol: 195 mg/dL (ref 0–200)
HDL: 50.5 mg/dL (ref >39.00)
NonHDL: 144.76
Total CHOL/HDL Ratio: 4
Triglycerides: 213 mg/dL — ABNORMAL HIGH (ref 0.0–149.0)
VLDL: 42.6 mg/dL — ABNORMAL HIGH (ref 0.0–40.0)

## 2021-09-27 LAB — HEMOGLOBIN A1C: Hgb A1c MFr Bld: 5.8 % (ref 4.6–6.5)

## 2021-09-27 LAB — LDL CHOLESTEROL, DIRECT: Direct LDL: 124 mg/dL

## 2021-09-27 MED ORDER — OZEMPIC (0.25 OR 0.5 MG/DOSE) 2 MG/3ML ~~LOC~~ SOPN
0.5000 mg | PEN_INJECTOR | SUBCUTANEOUS | 1 refills | Status: DC
  Filled 2021-11-13: qty 3, 28d supply, fill #0

## 2021-09-27 MED ORDER — OZEMPIC (0.25 OR 0.5 MG/DOSE) 2 MG/1.5ML ~~LOC~~ SOPN: 0.5000 mg | PEN_INJECTOR | SUBCUTANEOUS | 1 refills | Status: DC

## 2021-09-27 NOTE — Progress Notes (Signed)
Established Patient Office Visit  Subjective:  Patient ID: Dawn Mcguire, female    DOB: 1965/12/02  Age: 56 y.o. MRN: [id number]  CC:  Chief Complaint  Patient presents with   Advice Only    Discuss Ozempic to help with weight loss.     HPI Dawn Mcguire presents for for health check and follow-up of her obesity.  Morning.  He is on health maintenance except for PE.  Her father was diagnosed with colon cancer at age 86.  She is interested in trying Ozempic.  She is already exercising daily for 40 minutes she has already been able to lose weight diet.  Past Medical History:  Diagnosis Date   Arthritis    Carpal tunnel syndrome of right wrist    Closed displaced trimalleolar fracture of right ankle 11/20/2018   GERD (gastroesophageal reflux disease)    Psoriasis     Past Surgical History:  Procedure Laterality Date   ABDOMINAL HYSTERECTOMY     BLADDER REPAIR     BREAST SURGERY     CARPAL TUNNEL RELEASE     on the right wrist   CHOLECYSTECTOMY     INSERTION OF MESH N/A 10/16/2017   Procedure: INSERTION OF MESH;  Surgeon: Donnie Mesa, MD;  Location: [facility];  Service: General;  Laterality: N/A;   IR US GUIDE BX ASP/DRAIN  12/31/2017   ORIF ANKLE FRACTURE Right 11/20/2018   Procedure: OPEN REDUCTION INTERNAL FIXATION (ORIF) ANKLE FRACTURE AND SYNDESMOSIS;  Surgeon: Marchia Bond, MD;  Location: [place] [facility];  Service: Orthopedics;  Laterality: Right;   VENTRAL HERNIA REPAIR N/A 10/16/2017   Procedure: OPEN VENTRAL HERNIA REPAIR WITH MESH;  Surgeon: Donnie Mesa, MD;  Location: [facility];  Service: General;  Laterality: N/A;    Family History  Problem Relation Age of Onset   Arthritis Mother    Heart attack Mother    High blood pressure Mother    Cancer Father    Diabetes Father    Arthritis Brother    Arthritis Brother    Arthritis Brother    Diabetes Son     Social History   Socioeconomic History   Marital status: Married    Spouse name: Not on  file   Number of children: Not on file   Years of education: Not on file   Highest education level: Not on file  Occupational History   Not on file  Tobacco Use   Smoking status: Never   Smokeless tobacco: Never  Vaping Use   Vaping Use: Never used  Substance and Sexual Activity   Alcohol use: No   Drug use: No   Sexual activity: Yes    Birth control/protection: Surgical  Other Topics Concern   Not on file  Social History Narrative   Not on file   Social Determinants of Health   Financial Resource Strain: Not on file  Food Insecurity: Not on file  Transportation Needs: Not on file  Physical Activity: Not on file  Stress: Not on file  Social Connections: Not on file  Intimate Partner Violence: Not on file    Outpatient Medications Prior to Visit  Medication Sig Dispense Refill   albuterol (PROAIR HFA) 108 (90 Base) MCG/ACT inhaler Inhale 2 puffs into the lungs every 6 (six) hours as needed for wheezing or shortness of breath. 1 each 0   ALPRAZolam (XANAX) 0.5 MG tablet Take 0.5 mg by mouth daily as needed for anxiety.     Ascorbic Acid (VITAMIN  C) 100 MG tablet Take 100 mg by mouth daily.     Biotin 1 MG CAPS      cholecalciferol (VITAMIN D3) 25 MCG (1000 UT) tablet Take 2,000 Units by mouth daily.     Cyanocobalamin (B-12) 2500 MCG TABS Take 2,500 mcg by mouth daily.     loratadine (CLARITIN) 10 MG tablet 1 tablet     naproxen (NAPROSYN) 500 MG tablet Take 1 tablet (500 mg total) by mouth 2 (two) times daily with a meal. 30 tablet 0   omeprazole (PRILOSEC) 40 MG capsule Take 1 capsule (40 mg total) by mouth daily. 30 capsule 3   tiZANidine (ZANAFLEX) 4 MG tablet Take 1 tablet (4 mg total) by mouth 3 (three) times daily. 30 tablet 1   Zinc Sulfate (ZINC 15 PO) Take by mouth.     No facility-administered medications prior to visit.    Allergies  Allergen Reactions   Latex Other (See Comments)    Tears skin and break out   Sulfa Antibiotics Itching and Nausea And  Vomiting    Skin becomes red and feels like it is burning   Sulfasalazine Hives, Itching and Nausea And Vomiting    Skin becomes red and feels like it is burning    Corticosteroids Hives, Itching and Rash    fever   Nitrofurantoin Monohyd Macro Nausea And Vomiting   Penicillins Hives, Itching and Rash    Did it involve swelling of the face/tongue/throat, SOB, or low BP? No Did it involve sudden or severe rash/hives, skin peeling, or any reaction on the inside of your mouth or nose? No Did you need to seek medical attention at a hospital or doctor's office? No When did it last happen?      5 + years If all above answers are NO, may proceed with cephalosporin use.     Prednisone Hives, Itching and Other (See Comments)    fever   Tramadol Nausea Only    ROS Review of Systems  Constitutional: Negative.   HENT: Negative.    Respiratory: Negative.    Cardiovascular: Negative.   Gastrointestinal: Negative.   Endocrine: Negative for cold intolerance and heat intolerance.  Genitourinary: Negative.   Musculoskeletal:  Negative for gait problem and joint swelling.  Neurological:  Negative for speech difficulty and weakness.     Objective:    Physical Exam Vitals and nursing note reviewed.  Constitutional:      General: She is not in acute distress.    Appearance: Normal appearance. She is obese. She is not ill-appearing, toxic-appearing or diaphoretic.  HENT:     Head: Normocephalic and atraumatic.     Right Ear: Tympanic membrane, ear canal and external ear normal.     Left Ear: Tympanic membrane, ear canal and external ear normal.     Mouth/Throat:     Mouth: Mucous membranes are moist.     Pharynx: Oropharynx is clear. No oropharyngeal exudate or posterior oropharyngeal erythema.  Eyes:     General:        Right eye: No discharge.        Left eye: No discharge.     Extraocular Movements: Extraocular movements intact.     Conjunctiva/sclera: Conjunctivae normal.      Pupils: Pupils are equal, round, and reactive to light.  Cardiovascular:     Rate and Rhythm: Normal rate and regular rhythm.  Pulmonary:     Effort: Pulmonary effort is normal.     Breath sounds: Normal  breath sounds.  Abdominal:     General: Bowel sounds are normal.  Musculoskeletal:     Cervical back: Normal range of motion and neck supple. No rigidity or tenderness.  Lymphadenopathy:     Cervical: No cervical adenopathy.  Skin:    General: Skin is warm and dry.  Neurological:     Mental Status: She is alert and oriented to person, place, and time.  Psychiatric:        Mood and Affect: Mood normal.        Behavior: Behavior normal.    BP 116/70 (BP Location: Right Arm, Patient Position: Sitting, Cuff Size: Large)    Pulse 69    Temp (!) 97.4 F (36.3 C) (Temporal)    Ht 5\' 8"  (1.727 m)    Wt 256 lb 9.6 oz (116.4 kg)    SpO2 97%    BMI 39.02 kg/m  Wt Readings from Last 3 Encounters:  09/27/21 256 lb 9.6 oz (116.4 kg)  09/04/21 256 lb 12.8 oz (116.5 kg)  07/19/21 254 lb 4 oz (115.3 kg)     Health Maintenance Due  Topic Date Due   HIV Screening  Never done   Hepatitis C Screening  Never done    There are no preventive care reminders to display for this patient.  No results found for: TSH Lab Results  Component Value Date   WBC 8.7 03/10/2019   HGB 14.1 03/10/2019   HCT 42.8 03/10/2019   MCV 90.9 03/10/2019   PLT 301 03/10/2019   Lab Results  Component Value Date   NA 138 03/10/2019   K 3.5 03/10/2019   CO2 23 03/10/2019   GLUCOSE 111 (H) 03/10/2019   BUN 21 (H) 03/10/2019   CREATININE 0.86 03/10/2019   BILITOT 1.0 03/04/2019   ALKPHOS 108 03/04/2019   AST 19 03/04/2019   ALT 16 03/04/2019   PROT 7.5 03/04/2019   ALBUMIN 4.3 03/04/2019   CALCIUM 9.0 03/10/2019   ANIONGAP 11 03/10/2019   No results found for: CHOL No results found for: HDL No results found for: LDLCALC No results found for: TRIG No results found for: CHOLHDL No results found for:  HGBA1C    Assessment & Plan:   Problem List Items Addressed This Visit       Other   Morbid obesity ([place])   Relevant Medications   Semaglutide,0.25 or 0.5MG /DOS, (OZEMPIC, 0.25 OR 0.5 MG/DOSE,) 2 MG/1.5ML SOPN   Obesity (BMI 35.0-39.9 without comorbidity) - Primary   Relevant Medications   Semaglutide,0.25 or 0.5MG /DOS, (OZEMPIC, 0.25 OR 0.5 MG/DOSE,) 2 MG/1.5ML SOPN   Other Visit Diagnoses     Healthcare maintenance       Relevant Orders   CBC   Comprehensive metabolic panel   LDL cholesterol, direct   Hemoglobin A1c   Lipid panel   Urinalysis, Routine w reflex microscopic   Screen for colon cancer       Relevant Orders   Ambulatory referral to Gastroenterology       Meds ordered this encounter  Medications   DISCONTD: Semaglutide,0.25 or 0.5MG /DOS, (OZEMPIC, 0.25 OR 0.5 MG/DOSE,) 2 MG/1.5ML SOPN    Sig: Inject 0.5 mg into the skin once a week.    Dispense:  4.5 mL    Refill:  1   Semaglutide,0.25 or 0.5MG /DOS, (OZEMPIC, 0.25 OR 0.5 MG/DOSE,) 2 MG/1.5ML SOPN    Sig: Inject 0.5 mg into the skin once a week.    Dispense:  4.5 mL    Refill:  1    Needs appropriate pen needles    Follow-up: Return in about 3 months (around 12/28/2021).  Patient was given information about Ozempic.  We discussed side effects especially with carbohydrate overload.  She is aware that it is associated with cancer in mice.  She will continue to exercise and usual healthy lifestyle that has led to weight loss in the past.  Libby Maw, MD

## 2021-10-22 ENCOUNTER — Telehealth: Payer: Self-pay | Admitting: Family Medicine

## 2021-10-22 NOTE — Telephone Encounter (Signed)
Pt called saying she prefers to do the cologard instead of colonoscopy.  ?

## 2021-11-13 ENCOUNTER — Other Ambulatory Visit (HOSPITAL_COMMUNITY): Payer: Self-pay

## 2021-11-14 ENCOUNTER — Other Ambulatory Visit (HOSPITAL_COMMUNITY): Payer: Self-pay

## 2021-11-15 NOTE — Telephone Encounter (Signed)
Pt is wanting her colonoscopy cancelled and is only wanting a cologuard TEST. She keeps her grandchild and it's hard to get away. Please advise pt at [phone] ?

## 2021-11-19 NOTE — Telephone Encounter (Signed)
Will have cologuard form for patient to sing at next visit to be sent off so kit will be mailed to patient ?

## 2021-12-03 ENCOUNTER — Encounter: Payer: Self-pay | Admitting: Nurse Practitioner

## 2021-12-03 ENCOUNTER — Ambulatory Visit (INDEPENDENT_AMBULATORY_CARE_PROVIDER_SITE_OTHER): Payer: No Typology Code available for payment source | Admitting: Nurse Practitioner

## 2021-12-03 VITALS — BP 136/88 | HR 74 | Temp 96.5°F | Wt 249.0 lb

## 2021-12-03 DIAGNOSIS — R8290 Unspecified abnormal findings in urine: Secondary | ICD-10-CM

## 2021-12-03 DIAGNOSIS — N3001 Acute cystitis with hematuria: Secondary | ICD-10-CM | POA: Diagnosis not present

## 2021-12-03 LAB — POCT URINALYSIS DIPSTICK
Bilirubin, UA: NEGATIVE
Blood, UA: POSITIVE
Glucose, UA: NEGATIVE
Leukocytes, UA: NEGATIVE
Nitrite, UA: POSITIVE
Protein, UA: NEGATIVE
Spec Grav, UA: >=1.03 — AB (ref 1.010–1.025)
Urobilinogen, UA: 0.2 E.U./dL
pH, UA: 6 (ref 5.0–8.0)

## 2021-12-03 MED ORDER — TAMSULOSIN HCL 0.4 MG PO CAPS: 0.4000 mg | ORAL_CAPSULE | Freq: Every day | ORAL | 0 refills | Status: DC

## 2021-12-03 MED ORDER — CEPHALEXIN 500 MG PO CAPS: 500.0000 mg | ORAL_CAPSULE | Freq: Three times a day (TID) | ORAL | 0 refills | Status: DC

## 2021-12-03 MED ORDER — FLUCONAZOLE 150 MG PO TABS: ORAL_TABLET | ORAL | 0 refills | Status: DC

## 2021-12-03 NOTE — Assessment & Plan Note (Addendum)
Urine positive for 3+ blood and nitrates.  We will treat with Keflex due to multiple antibiotic allergies for UTI.  She does have a history of kidney stones as well, we will give her Flomax to take daily in case this is related to a kidney stone since [place].  Sending urine for culture.  Encouraged her to drink plenty of fluids.  Diflucan sent in for prevention of yeast infection with antibiotics.  Follow-up if symptoms worsen or do not improve. ?

## 2021-12-03 NOTE — Patient Instructions (Signed)
It was great to see you! ? ?Start keflex 1 capsule 3 times a day for 7 days. Make sure you are drinking plenty of fluids.  ? ?I have sent in diflucan for you to take when you finish your antibiotic.  ? ?You can take flomax daily as needed.  ? ?Let's follow-up if your symptoms don't improve or worsen.  ? ?Take care, ? ?Vance Peper, NP ? ?

## 2021-12-03 NOTE — Progress Notes (Signed)
? ?Acute Visit ? ?BP 136/88 (BP Location: Right Arm, Cuff Size: Large)   Pulse 74   Temp (!) 96.5 ?F (35.8 ?C) (Temporal)   Wt 249 lb (112.9 kg)   SpO2 96%   BMI 37.86 kg/m?   ? ?Subjective:  ? ? Patient ID: Devonna Oboyle, female    DOB: 1965-09-08, 56 y.o.   MRN: [id number] ? ?CC: ?Chief Complaint  ?Patient presents with  ? Urinary Tract Infection  ?  Pt c/o urine odor, bladder/pelvic pressure, w/ lower back pain x4 days.   ? ? ?HPI: ?Haani Bakula is a 56 y.o. female presents today with bladder/pelvic pressure, urine odor, and low back pain x4 days. Of note, she did start ozempic 3 weeks ago and had about 5 days of severe diarrhea which makes her think that this could have caused a UTI. The pain is different than her kidney stone pain.  ? ?URINARY SYMPTOMS ? ?Dysuria: no ?Urinary frequency: yes ?Urgency: yes ?Small volume voids: yes ?Symptom severity:  moderate ?Urinary incontinence: no ?Foul odor: yes ?Hematuria: no ?Abdominal pain: no ?Back pain: yes ?Suprapubic pain/pressure: yes ?Flank pain: no ?Fever:  no ?Vomiting: no ?Relief with cranberry juice: yes ?Relief with pyridium:  n/a ?Status: better/worse/stable ?Previous urinary tract infection: yes ?Recurrent urinary tract infection: no ?Sexual activity: monogomous ?Treatments attempted: cranberry and increasing fluids  ? ?Past Medical History:  ?Diagnosis Date  ? Arthritis   ? Carpal tunnel syndrome of right wrist   ? Closed displaced trimalleolar fracture of right ankle 11/20/2018  ? GERD (gastroesophageal reflux disease)   ? Psoriasis   ? ? ?Past Surgical History:  ?Procedure Laterality Date  ? ABDOMINAL HYSTERECTOMY    ? BLADDER REPAIR    ? BREAST SURGERY    ? CARPAL TUNNEL RELEASE    ? on the right wrist  ? CHOLECYSTECTOMY    ? INSERTION OF MESH N/A 10/16/2017  ? Procedure: INSERTION OF MESH;  Surgeon: Donnie Mesa, MD;  Location: [facility];  Service: General;  Laterality: N/A;  ? IR US GUIDE BX ASP/DRAIN  12/31/2017  ? ORIF ANKLE FRACTURE Right  11/20/2018  ? Procedure: OPEN REDUCTION INTERNAL FIXATION (ORIF) ANKLE FRACTURE AND SYNDESMOSIS;  Surgeon: Marchia Bond, MD;  Location: [place];  Service: Orthopedics;  Laterality: Right;  ? VENTRAL HERNIA REPAIR N/A 10/16/2017  ? Procedure: OPEN VENTRAL HERNIA REPAIR WITH MESH;  Surgeon: Donnie Mesa, MD;  Location: [place];  Service: General;  Laterality: N/A;  ? ? ?Family History  ?Problem Relation Age of Onset  ? Arthritis Mother   ? Heart attack Mother   ? High blood pressure Mother   ? Cancer Father   ? Diabetes Father   ? Arthritis Brother   ? Arthritis Brother   ? Arthritis Brother   ? Diabetes Son   ?  ? ?Current Outpatient Medications on File Prior to Visit  ?Medication Sig Dispense Refill  ? albuterol (PROAIR HFA) 108 (90 Base) MCG/ACT inhaler Inhale 2 puffs into the lungs every 6 (six) hours as needed for wheezing or shortness of breath. 1 each 0  ? ALPRAZolam (XANAX) 0.5 MG tablet Take 0.5 mg by mouth daily as needed for anxiety.    ? Ascorbic Acid (VITAMIN C) 100 MG tablet Take 100 mg by mouth daily.    ? Biotin 1 MG CAPS     ? cholecalciferol (VITAMIN D3) 25 MCG (1000 UT) tablet Take 2,000 Units by mouth daily.    ? Cyanocobalamin (B-12) 2500 MCG  TABS Take 2,500 mcg by mouth daily.    ? loratadine (CLARITIN) 10 MG tablet 1 tablet    ? naproxen (NAPROSYN) 500 MG tablet Take 1 tablet (500 mg total) by mouth 2 (two) times daily with a meal. 30 tablet 0  ? omeprazole (PRILOSEC) 40 MG capsule Take 1 capsule (40 mg total) by mouth daily. 30 capsule 3  ? OZEMPIC, 0.25 OR 0.5 MG/DOSE, 2 MG/1.5ML SOPN Inject 0.5 mg into the skin once a week.    ? tiZANidine (ZANAFLEX) 4 MG tablet Take 1 tablet (4 mg total) by mouth 3 (three) times daily. 30 tablet 1  ? Zinc Sulfate (ZINC 15 PO) Take by mouth.    ? ?No current facility-administered medications on file prior to visit.  ?  ? ?Review of Systems ?See pertinent positives and negatives per HPI. ? ?   ?Objective:  ?  ?BP 136/88 (BP Location: Right  Arm, Cuff Size: Large)   Pulse 74   Temp (!) 96.5 ?F (35.8 ?C) (Temporal)   Wt 249 lb (112.9 kg)   SpO2 96%   BMI 37.86 kg/m?   ?Wt Readings from Last 3 Encounters:  ?12/03/21 249 lb (112.9 kg)  ?09/27/21 256 lb 9.6 oz (116.4 kg)  ?09/04/21 256 lb 12.8 oz (116.5 kg)  ?  ?BP Readings from Last 3 Encounters:  ?12/03/21 136/88  ?09/27/21 116/70  ?09/04/21 130/76  ?  ?Physical Exam ?Vitals and nursing note reviewed.  ?Constitutional:   ?   General: She is not in acute distress. ?   Appearance: Normal appearance.  ?HENT:  ?   Head: Normocephalic.  ?Eyes:  ?   Conjunctiva/sclera: Conjunctivae normal.  ?Cardiovascular:  ?   Rate and Rhythm: Normal rate and regular rhythm.  ?   Pulses: Normal pulses.  ?   Heart sounds: Normal heart sounds.  ?Pulmonary:  ?   Effort: Pulmonary effort is normal.  ?   Breath sounds: Normal breath sounds.  ?Abdominal:  ?   Palpations: Abdomen is soft.  ?   Tenderness: There is abdominal tenderness (suprapubic). There is no right CVA tenderness or left CVA tenderness.  ?Musculoskeletal:  ?   Cervical back: Normal range of motion.  ?Skin: ?   General: Skin is warm.  ?Neurological:  ?   General: No focal deficit present.  ?   Mental Status: She is alert and oriented to person, place, and time.  ?Psychiatric:     ?   Mood and Affect: Mood normal.     ?   Behavior: Behavior normal.     ?   Thought Content: Thought content normal.     ?   Judgment: Judgment normal.  ? ? ?Results for orders placed or performed in visit on 12/03/21  ?POCT urinalysis dipstick  ?Result Value Ref Range  ? Color, UA Dark Yellow   ? Clarity, UA    ? Glucose, UA Negative Negative  ? Bilirubin, UA neg   ? Ketones, UA trace   ? Spec Grav, UA >=1.030 (A) 1.010 - 1.025  ? Blood, UA pos   ? pH, UA 6.0 5.0 - 8.0  ? Protein, UA Negative Negative  ? Urobilinogen, UA 0.2 0.2 or 1.0 E.U./dL  ? Nitrite, UA pos   ? Leukocytes, UA Negative Negative  ? Appearance    ? Odor present   ? ?   ?Assessment & Plan:  ? ?Problem List Items  Addressed This Visit   ? ?  ? Genitourinary  ?  Urinary tract infection with hematuria - Primary  ?  Urine positive for 3+ blood and nitrates.  We will treat with Keflex due to multiple antibiotic allergies for UTI.  She does have a history of kidney stones as well, we will give her Flomax to take daily in case this is related to a kidney stone since [place].  Sending urine for culture.  Encouraged her to drink plenty of fluids.  Diflucan sent in for prevention of yeast infection with antibiotics.  Follow-up if symptoms worsen or do not improve. ? ?  ?  ? Relevant Medications  ? cephALEXin (KEFLEX) 500 MG capsule  ? fluconazole (DIFLUCAN) 150 MG tablet  ? ?Other Visit Diagnoses   ? ? Abnormal urine odor      ? UA positive for 3+ blood, nitrates.  Negative for leukocytes.  Treat for UTI  ? Relevant Orders  ? POCT urinalysis dipstick (Completed)  ? Urine Culture  ? ?  ?  ? ?Follow up plan: ?Return if symptoms worsen or fail to improve. ?

## 2021-12-05 LAB — URINE CULTURE
MICRO NUMBER:: 13364923
SPECIMEN QUALITY:: ADEQUATE

## 2021-12-10 ENCOUNTER — Other Ambulatory Visit: Payer: Self-pay | Admitting: Family Medicine

## 2021-12-10 ENCOUNTER — Other Ambulatory Visit (HOSPITAL_COMMUNITY): Payer: Self-pay

## 2021-12-10 DIAGNOSIS — E669 Obesity, unspecified: Secondary | ICD-10-CM

## 2021-12-10 MED ORDER — OZEMPIC (0.25 OR 0.5 MG/DOSE) 2 MG/3ML ~~LOC~~ SOPN
0.5000 mg | PEN_INJECTOR | SUBCUTANEOUS | 1 refills | Status: DC
  Filled 2021-12-10 – 2021-12-12 (×2): qty 3, 28d supply, fill #0

## 2021-12-12 ENCOUNTER — Other Ambulatory Visit (HOSPITAL_COMMUNITY): Payer: Self-pay

## 2021-12-17 ENCOUNTER — Encounter: Payer: No Typology Code available for payment source | Admitting: Gastroenterology

## 2021-12-27 ENCOUNTER — Ambulatory Visit: Payer: No Typology Code available for payment source | Admitting: Family Medicine

## 2022-03-28 ENCOUNTER — Emergency Department (HOSPITAL_BASED_OUTPATIENT_CLINIC_OR_DEPARTMENT_OTHER)
Admission: EM | Admit: 2022-03-28 | Discharge: 2022-03-28 | Disposition: A | Discharge status: Home / Self Care | Payer: No Typology Code available for payment source | Attending: Emergency Medicine | Admitting: Emergency Medicine

## 2022-03-28 ENCOUNTER — Other Ambulatory Visit: Payer: Self-pay

## 2022-03-28 ENCOUNTER — Encounter (HOSPITAL_BASED_OUTPATIENT_CLINIC_OR_DEPARTMENT_OTHER): Payer: Self-pay | Admitting: Emergency Medicine

## 2022-03-28 DIAGNOSIS — R0789 Other chest pain: Secondary | ICD-10-CM | POA: Insufficient documentation

## 2022-03-28 DIAGNOSIS — Z91040 Latex allergy status: Secondary | ICD-10-CM | POA: Insufficient documentation

## 2022-03-28 DIAGNOSIS — R202 Paresthesia of skin: Secondary | ICD-10-CM | POA: Insufficient documentation

## 2022-03-28 DIAGNOSIS — T7840XA Allergy, unspecified, initial encounter: Secondary | ICD-10-CM | POA: Insufficient documentation

## 2022-03-28 MED ORDER — DIPHENHYDRAMINE HCL 25 MG PO CAPS
50.0000 mg | ORAL_CAPSULE | Freq: Once | ORAL | Status: AC
  Administered 2022-03-28: 50 mg via ORAL
  Filled 2022-03-28: qty 2

## 2022-03-28 NOTE — ED Provider Notes (Signed)
[facility] Provider Note   CSN: [id number] Arrival date & time: 03/28/22  2141     History  Chief Complaint  Patient presents with   Allergic Reaction    Dawn Mcguire is a 56 y.o. female.  56 year old female presented to emergency department with husband after she was stung by wasp to left cheek about 40 minutes ago. Patient complaining of chest tightness, tingling of the lips and both arms.  Denies difficulty swallowing, difficulty breathing. Denies swelling of the tongue or tingling and numbness in the back of the throat.   Denies chest pain, dizziness, headache, fever or chills.  There is localized redness and swelling of the left cheek with mild itching.  No stinger present Patient took 50 mg of Benadryl which she vomited about 5 minutes after. Blood pressure elevated upon arrival.  Other vitals within normal limits. Patient anxious appearing.  She is comfortably communicating with complete sentences.  No obvious visible distress.  Husband present in the room.    The history is provided by the patient. No language interpreter was used.  Allergic Reaction Presenting symptoms: itching, rash and swelling   Presenting symptoms: no difficulty breathing, no difficulty swallowing and no wheezing   Context: insect bite/sting        Home Medications Prior to Admission medications   Medication Sig Start Date End Date Taking? Authorizing Provider  albuterol (PROAIR HFA) 108 (90 Base) MCG/ACT inhaler Inhale 2 puffs into the lungs every 6 (six) hours as needed for wheezing or shortness of breath. 07/19/21   Michela Pitcher, NP  ALPRAZolam Duanne Moron) 0.5 MG tablet Take 0.5 mg by mouth daily as needed for anxiety.    [provider]  Ascorbic Acid (VITAMIN C) 100 MG tablet Take 100 mg by mouth daily.    [provider]  Biotin 1 MG CAPS     [provider]  cephALEXin (KEFLEX) 500 MG capsule Take 1 capsule (500 mg total) by  mouth 3 (three) times daily. 12/03/21   McElwee, Lauren A, NP  cholecalciferol (VITAMIN D3) 25 MCG (1000 UT) tablet Take 2,000 Units by mouth daily.    [provider]  Cyanocobalamin (B-12) 2500 MCG TABS Take 2,500 mcg by mouth daily.    [provider]  fluconazole (DIFLUCAN) 150 MG tablet Take 1 tablet when finishing your antibiotic, then take a second one in 3 days if needed. 12/03/21   McElwee, Scheryl Darter, NP  loratadine (CLARITIN) 10 MG tablet 1 tablet    [provider]  naproxen (NAPROSYN) 500 MG tablet Take 1 tablet (500 mg total) by mouth 2 (two) times daily with a meal. 09/04/21   Haydee Salter, MD  omeprazole (PRILOSEC) 40 MG capsule Take 1 capsule (40 mg total) by mouth daily. 07/23/21   Libby Maw, MD  OZEMPIC, 0.25 OR 0.5 MG/DOSE, 2 MG/1.5ML SOPN Inject 0.5 mg into the skin once a week. 11/12/21   [provider]  Semaglutide,0.25 or 0.'5MG'$ /DOS, (OZEMPIC, 0.25 OR 0.5 MG/DOSE,) 2 MG/3ML SOPN Inject 0.5 mg into the skin once a week. 12/10/21   Libby Maw, MD  tamsulosin (FLOMAX) 0.4 MG CAPS capsule Take 1 capsule (0.4 mg total) by mouth daily. 12/03/21   McElwee, Lauren A, NP  tiZANidine (ZANAFLEX) 4 MG tablet Take 1 tablet (4 mg total) by mouth 3 (three) times daily. 09/04/21   Haydee Salter, MD  Zinc Sulfate (ZINC 15 PO) Take by mouth.    [provider]      Allergies    Latex, Sulfa antibiotics, Sulfasalazine, Corticosteroids, Nitrofurantoin monohyd macro, Penicillins, Prednisone, and Tramadol    Review of Systems   Review of Systems  Constitutional:  Negative for chills, diaphoresis and fatigue.  HENT:  Negative for trouble swallowing.   Eyes:  Negative for redness and visual disturbance.  Respiratory:  Positive for chest tightness. Negative for cough, shortness of breath and wheezing.   Cardiovascular:  Negative for chest pain and palpitations.  Gastrointestinal:  Positive for vomiting. Negative for abdominal pain  and nausea.  Skin:  Positive for itching and rash.  Neurological:  Positive for numbness. Negative for dizziness, weakness, light-headedness and headaches.  Psychiatric/Behavioral:         Anxious    Physical Exam Updated Vital Signs BP (!) 161/99   Pulse 72   Temp 98.1 F (36.7 C) (Oral)   Resp 16   Ht '5\' 8"'$  (1.727 m)   Wt 108 kg   SpO2 97%   BMI 36.19 kg/m  Physical Exam Vitals and nursing note reviewed.  Constitutional:      Appearance: Normal appearance.  HENT:     Head: Normocephalic and atraumatic.     Nose: Nose normal.     Mouth/Throat:     Pharynx: No oropharyngeal exudate or posterior oropharyngeal erythema.     Comments: No redness, swelling.  Airway patent.  Uvula midline. Eyes:     Conjunctiva/sclera: Conjunctivae normal.     Pupils: Pupils are equal, round, and reactive to light.  Cardiovascular:     Rate and Rhythm: Normal rate and regular rhythm.  Pulmonary:     Effort: Pulmonary effort is normal. No respiratory distress.     Breath sounds: Normal breath sounds. No wheezing, rhonchi or rales.  Chest:     Chest wall: Tenderness present.  Skin:    Capillary Refill: Capillary refill takes less than 2 seconds.     Findings: Erythema (Localized redness, swelling to the left cheek.) and rash present.  Neurological:     General: No focal deficit present.     Mental Status: She is alert and oriented to person, place, and time.     ED Results / Procedures / Treatments   Labs (all labs ordered are listed, but only abnormal results are displayed) Labs Reviewed - No data to display  EKG EKG Interpretation  Date/Time:  Thursday March 28 2022 21:52:07 EDT Ventricular Rate:  75 PR Interval:  150 QRS Duration: 86 QT Interval:  388 QTC Calculation: 433 R Axis:   7 Text Interpretation: Normal sinus rhythm Normal ECG No old tracing to compare Confirmed by Deno Etienne [phone]) on 03/28/2022 10:23:28 PM  Radiology No results found.  Procedures Procedures     Medications Ordered in ED Medications  diphenhydrAMINE (BENADRYL) capsule 50 mg (50 mg Oral Given 03/28/22 2300)    ED Course/ Medical Decision Making/ A&P                           Medical Decision Making 56 year old female presented to emergency department after she was stung by a Wasp. Patient complaining of swelling and redness to the left cheek, chest tightness, tingling of lips and both arms.  She denies chest pain, shortness of breath, difficulty swallowing, swelling of the tongue or heaviness in the back of the throat.  Patient took 50 mg of Benadryl which she immediately vomited.  Denies abdominal pain.  Patient anxious appearing.  No acute visible respiratory distress.  EKG: Sinus rhythm, normal axis.  No ST changes appreciated.  After the physical examination there is no indication of anaphylaxis.  Patient is comfortably breathing, communicating with complete sentences.  Lungs clear to auscultation without wheeze or limitation to airflow.  Airway remains patent, uvula midline. I will treat with oral Benadryl 50 mg x 1 in the emergency room. She will continue Zyrtec, Pepcid over-the-counter and Benadryl as needed.  Patient will be discharged with education on the red flag signs if any noted she will call 911 or return to emergency department for reevaluation.  Patient in no distress and overall condition improved here in the ED. Detailed discussions were had with the patient regarding current findings, and need for close f/u with PCP or on call doctor. The patient has been instructed to return immediately if the symptoms worsen in any way for re-evaluation. Patient verbalized understanding and is in agreement with current care plan. All questions answered prior to discharge.            Final Clinical Impression(s) / ED Diagnoses Final diagnoses:  Allergic reaction, initial encounter    Rx / DC Orders ED Discharge Orders     None         Teola Bradley,  MD 03/28/22 [place], [place], DO 03/28/22 2354

## 2022-03-28 NOTE — ED Triage Notes (Addendum)
Pt was stung by bee on left cheek 30 min PTA. Pt denies throat or tongue swelling. Pt c/o chest pain and arm numbness. Pt took 50 mg Benadryl PTA. Pt very anxious

## 2022-03-28 NOTE — ED Triage Notes (Signed)
Pt states she now feels a knot in her throat.

## 2022-03-28 NOTE — Discharge Instructions (Signed)
May take Zyrtec, Pepcid and Benadryl as needed. PCP follow-up if symptoms continue to persist. Return to emergency department if worsening of symptoms.

## 2022-04-11 ENCOUNTER — Encounter: Payer: Self-pay | Admitting: Family Medicine

## 2022-04-11 ENCOUNTER — Ambulatory Visit (INDEPENDENT_AMBULATORY_CARE_PROVIDER_SITE_OTHER): Payer: Self-pay | Admitting: Family Medicine

## 2022-04-11 VITALS — BP 110/80 | HR 68 | Temp 97.7°F | Ht 68.0 in | Wt 244.0 lb

## 2022-04-11 DIAGNOSIS — N390 Urinary tract infection, site not specified: Secondary | ICD-10-CM

## 2022-04-11 DIAGNOSIS — Z87448 Personal history of other diseases of urinary system: Secondary | ICD-10-CM | POA: Insufficient documentation

## 2022-04-11 DIAGNOSIS — N3000 Acute cystitis without hematuria: Secondary | ICD-10-CM

## 2022-04-11 LAB — URINALYSIS, ROUTINE W REFLEX MICROSCOPIC
Bilirubin Urine: NEGATIVE
Ketones, ur: NEGATIVE
Nitrite: POSITIVE — AB
Specific Gravity, Urine: <=1.005 — AB (ref 1.000–1.030)
Total Protein, Urine: NEGATIVE
Urine Glucose: NEGATIVE
Urobilinogen, UA: 0.2 (ref 0.0–1.0)
pH: 7 (ref 5.0–8.0)

## 2022-04-11 MED ORDER — CEPHALEXIN 500 MG PO CAPS
500.0000 mg | ORAL_CAPSULE | Freq: Three times a day (TID) | ORAL | 0 refills | Status: AC
Start: 2022-04-11 — End: 2022-04-18

## 2022-04-11 NOTE — Progress Notes (Signed)
Established Patient Office Visit  Subjective   Patient ID: Dawn Mcguire, female    DOB: January 11, 1966  Age: 56 y.o. MRN: [id number]  Chief Complaint  Patient presents with   Urinary Tract Infection    Pt c/o UTI x 2 days    Urinary Tract Infection  Associated symptoms include frequency and urgency. Pertinent negatives include no chills, flank pain or hematuria.   2 to 3-day history urinary frequency with slight dysuria lower back pain and suprapubic pressure.  There has been no fever chills nausea or vomiting.  Recent UTI in this past May.  History of urethral stricture.  Multiple antibiotic sensitivities.  Has some difficulty maintaining hydration.  She is quick to urinate after intercourse.    Review of Systems  Constitutional:  Negative for chills, diaphoresis, malaise/fatigue and weight loss.  HENT: Negative.    Eyes: Negative.  Negative for blurred vision and double vision.  Respiratory: Negative.    Cardiovascular:  Negative for chest pain.  Gastrointestinal:  Negative for abdominal pain.  Genitourinary:  Positive for dysuria, frequency and urgency. Negative for flank pain and hematuria.  Musculoskeletal:  Negative for falls and myalgias.  Neurological:  Negative for speech change, loss of consciousness and weakness.  Psychiatric/Behavioral: Negative.        Objective:     BP 110/80 (BP Location: Right Arm, Patient Position: Sitting, Cuff Size: Large)   Pulse 68   Temp 97.7 F (36.5 C) (Temporal)   Ht '5\' 8"'$  (1.727 m)   Wt 244 lb (110.7 kg)   SpO2 98%   BMI 37.10 kg/m    Physical Exam Constitutional:      General: She is not in acute distress.    Appearance: Normal appearance. She is not ill-appearing, toxic-appearing or diaphoretic.  HENT:     Head: Normocephalic and atraumatic.     Right Ear: External ear normal.     Left Ear: External ear normal.  Eyes:     General: No scleral icterus.       Right eye: No discharge.        Left eye: No discharge.      Extraocular Movements: Extraocular movements intact.     Conjunctiva/sclera: Conjunctivae normal.  Cardiovascular:     Rate and Rhythm: Normal rate and regular rhythm.  Pulmonary:     Effort: Pulmonary effort is normal. No respiratory distress.     Breath sounds: Normal breath sounds.  Abdominal:     General: Bowel sounds are normal.     Tenderness: There is no abdominal tenderness. There is no right CVA tenderness, left CVA tenderness or guarding.  Musculoskeletal:     Cervical back: No rigidity or tenderness.  Skin:    General: Skin is warm and dry.  Neurological:     Mental Status: She is alert and oriented to person, place, and time.  Psychiatric:        Mood and Affect: Mood normal.        Behavior: Behavior normal.      No results found for any visits on 04/11/22.    The 10-year ASCVD risk score (Arnett DK, et al., 2019) is: 2.1%    Assessment & Plan:   Problem List Items Addressed This Visit       Genitourinary   Acute cystitis without hematuria - Primary   Relevant Medications   cephALEXin (KEFLEX) 500 MG capsule   Other Relevant Orders   Urinalysis, Routine w reflex microscopic   Urine Culture  Frequent UTI   Relevant Medications   cephALEXin (KEFLEX) 500 MG capsule     Other   History of urethral stricture    Return if symptoms worsen or fail to improve.   We will send urine for culture.  Patient will schedule appointment for urology for evaluation of possible return of stricture of the urethra. Libby Maw, MD

## 2022-04-12 ENCOUNTER — Telehealth: Payer: Self-pay | Admitting: Family Medicine

## 2022-04-12 LAB — UNLABELED: Test Ordered On Req: 395

## 2022-04-12 NOTE — Telephone Encounter (Signed)
Lab calling states that urine culture was not labeled for patient. Any ideas about this? Pease advise.

## 2022-04-12 NOTE — Telephone Encounter (Signed)
Caller Name: [facility] Call back phone #: [phone] Ref# [id number]  Reason for Call: A urine culture was received, does not have a name. Please call with reference #

## 2022-04-16 NOTE — Telephone Encounter (Signed)
Form signed and faxed to provided fax number on form. I called [facility] to see if form was received, form received per [facility] turn around time for results should be within the next 2- 3 days.

## 2022-04-20 LAB — URINE CULTURE
MICRO NUMBER:: 13937595
SPECIMEN QUALITY:: ADEQUATE

## 2022-04-20 LAB — PAT ID TIQ DOC: Test Affected: 395

## 2022-09-04 DIAGNOSIS — M25571 Pain in right ankle and joints of right foot: Secondary | ICD-10-CM | POA: Diagnosis not present

## 2022-11-11 ENCOUNTER — Encounter: Payer: Self-pay | Admitting: *Deleted

## 2023-02-13 ENCOUNTER — Ambulatory Visit: Payer: Self-pay | Admitting: Sports Medicine

## 2023-02-19 NOTE — Progress Notes (Unsigned)
    Aleen Sells [name] [address] [place] [address] Phone: [phone]   Assessment and Plan:     There are no diagnoses linked to this encounter.  ***   Pertinent previous records reviewed include ***   Follow Up: ***     Subjective:   I, Andre Swander, am serving as a Neurosurgeon for Doctor [employer]  Chief Complaint: low back and feet pain   HPI:   02/20/2023 Patient is a 57 year old female complaining of low back and feet pain. Patient states  Relevant Historical Information: ***  Additional pertinent review of systems negative.   Current Outpatient Medications:    albuterol (PROAIR HFA) 108 (90 Base) MCG/ACT inhaler, Inhale 2 puffs into the lungs every 6 (six) hours as needed for wheezing or shortness of breath., Disp: 1 each, Rfl: 0   ALPRAZolam (XANAX) 0.5 MG tablet, Take 0.5 mg by mouth daily as needed for anxiety., Disp: , Rfl:    Ascorbic Acid (VITAMIN C) 100 MG tablet, Take 100 mg by mouth daily., Disp: , Rfl:    Biotin 1 MG CAPS, , Disp: , Rfl:    cholecalciferol (VITAMIN D3) 25 MCG (1000 UT) tablet, Take 2,000 Units by mouth daily., Disp: , Rfl:    Cyanocobalamin (B-12) 2500 MCG TABS, Take 2,500 mcg by mouth daily., Disp: , Rfl:    fluconazole (DIFLUCAN) 150 MG tablet, Take 1 tablet when finishing your antibiotic, then take a second one in 3 days if needed., Disp: 2 tablet, Rfl: 0   loratadine (CLARITIN) 10 MG tablet, 1 tablet, Disp: , Rfl:    naproxen (NAPROSYN) 500 MG tablet, Take 1 tablet (500 mg total) by mouth 2 (two) times daily with a meal., Disp: 30 tablet, Rfl: 0   omeprazole (PRILOSEC) 40 MG capsule, Take 1 capsule (40 mg total) by mouth daily., Disp: 30 capsule, Rfl: 3   OZEMPIC, 0.25 OR 0.5 MG/DOSE, 2 MG/1.5ML SOPN, Inject 0.5 mg into the skin once a week. (Patient not taking: Reported on 04/11/2022), Disp: , Rfl:    Semaglutide,0.25 or 0.5MG /DOS, (OZEMPIC, 0.25 OR 0.5 MG/DOSE,) 2 MG/3ML SOPN,  Inject 0.5 mg into the skin once a week., Disp: 9 mL, Rfl: 1   tamsulosin (FLOMAX) 0.4 MG CAPS capsule, Take 1 capsule (0.4 mg total) by mouth daily., Disp: 30 capsule, Rfl: 0   tiZANidine (ZANAFLEX) 4 MG tablet, Take 1 tablet (4 mg total) by mouth 3 (three) times daily., Disp: 30 tablet, Rfl: 1   Zinc Sulfate (ZINC 15 PO), Take by mouth., Disp: , Rfl:    Objective:     There were no vitals filed for this visit.    There is no height or weight on file to calculate BMI.    Physical Exam:    ***   Electronically signed by:  Aleen Sells [name] 12:32 PM 02/19/23

## 2023-02-20 ENCOUNTER — Ambulatory Visit: Payer: 59 | Admitting: Sports Medicine

## 2023-02-20 ENCOUNTER — Ambulatory Visit (INDEPENDENT_AMBULATORY_CARE_PROVIDER_SITE_OTHER): Payer: 59

## 2023-02-20 VITALS — BP 126/78 | HR 74 | Ht 68.0 in | Wt 244.0 lb

## 2023-02-20 DIAGNOSIS — M5442 Lumbago with sciatica, left side: Secondary | ICD-10-CM

## 2023-02-20 DIAGNOSIS — M5441 Lumbago with sciatica, right side: Secondary | ICD-10-CM | POA: Diagnosis not present

## 2023-02-20 DIAGNOSIS — M4316 Spondylolisthesis, lumbar region: Secondary | ICD-10-CM

## 2023-02-20 DIAGNOSIS — M47816 Spondylosis without myelopathy or radiculopathy, lumbar region: Secondary | ICD-10-CM | POA: Diagnosis not present

## 2023-02-20 DIAGNOSIS — G8929 Other chronic pain: Secondary | ICD-10-CM | POA: Diagnosis not present

## 2023-02-20 DIAGNOSIS — M545 Low back pain, unspecified: Secondary | ICD-10-CM

## 2023-02-20 NOTE — Patient Instructions (Addendum)
-   Start meloxicam 15 mg daily x2 weeks.  If still having pain after 2 weeks, complete 3rd-week of meloxicam. May use remaining meloxicam as needed once daily for pain control.  Do not to use additional NSAIDs while taking meloxicam.  May use Tylenol [phone] mg 2 to 3 times a day for breakthrough pain. Low back HEP  Stationary bike, elliptical, and water aerobics MRI referral  Follow up 4 days after  MRI to discuss results

## 2023-03-07 ENCOUNTER — Other Ambulatory Visit: Payer: Self-pay | Admitting: Sports Medicine

## 2023-03-07 ENCOUNTER — Telehealth: Payer: Self-pay

## 2023-03-07 DIAGNOSIS — M4316 Spondylolisthesis, lumbar region: Secondary | ICD-10-CM

## 2023-03-07 DIAGNOSIS — G8929 Other chronic pain: Secondary | ICD-10-CM

## 2023-03-07 MED ORDER — MELOXICAM 15 MG PO TABS: 15.0000 mg | ORAL_TABLET | Freq: Every day | ORAL | 0 refills | Status: DC

## 2023-03-07 NOTE — Telephone Encounter (Signed)
Patient called stating her MRI was denied (which it is) because she needs six weeks of conservative therapy and a follow up with worsening or same pain. Patient is scheduled for September 19th for a follow up but she is almost out of Meloxicam and would like a refill to get her till she can get her MRI

## 2023-03-07 NOTE — Progress Notes (Signed)
-   Continue meloxicam 15 mg daily x2 weeks.  If still having pain after 2 weeks, complete 3rd-week of meloxicam. May use remaining meloxicam as needed once daily for pain control.  Do not to use additional NSAIDs while taking meloxicam.  May use Tylenol [phone] mg 2 to 3 times a day for breakthrough pain.  Follow-up scheduled for 04/17/2023 for reevaluation.

## 2023-03-20 ENCOUNTER — Telehealth: Payer: Self-pay | Admitting: Sports Medicine

## 2023-03-20 NOTE — Telephone Encounter (Signed)
Patient called stating that she has been taking Tylenol, Ibuprofen and Meloxicam but she is still having constant pain. She asked if there was something else Dr Jean Rosenthal would recommend or prescribe that might would help until she is here for her follow up appointment?

## 2023-03-21 ENCOUNTER — Other Ambulatory Visit: Payer: Self-pay | Admitting: Sports Medicine

## 2023-03-21 DIAGNOSIS — M4316 Spondylolisthesis, lumbar region: Secondary | ICD-10-CM

## 2023-03-21 DIAGNOSIS — G8929 Other chronic pain: Secondary | ICD-10-CM

## 2023-03-21 MED ORDER — CYCLOBENZAPRINE HCL 5 MG PO TABS: ORAL_TABLET | ORAL | 0 refills | Status: DC

## 2023-03-21 NOTE — Progress Notes (Signed)
Patient having continuing chronic pain.  Recommend continuing meloxicam 15 mg daily, Tylenol 500 to 1000 mg 3 times a day.  Will prescribe additional Flexeril 5 to 10 mg nightly as needed for muscle spasms.  Prescription sent to patient's pharmacy.

## 2023-03-21 NOTE — Telephone Encounter (Signed)
Spoke with pt and read Dr. Edison Pace response. Pt states she has NOT been taking ibuprofen, may have been confused initially.  Pt is interested in the muscle relaxer at nighttime, understands this will make her sleepy but thinks this will help as she has more discomfort when she lays down.Please send in meds to [facility] on file.  Pt advised to re-read AVS instructions on dosage/medications from last visit to ensure clarity.

## 2023-03-21 NOTE — Telephone Encounter (Signed)
Left message for patient to call back to discuss.

## 2023-04-16 NOTE — Progress Notes (Deleted)
    Dawn Mcguire [name] [address] [place] [address] Phone: [phone]   Assessment and Plan:     There are no diagnoses linked to this encounter.  ***   Pertinent previous records reviewed include ***   Follow Up: ***     Subjective:   I, Dawn Mcguire, am serving as a Neurosurgeon for Doctor [employer]   Chief Complaint: low back and feet pain    HPI:    02/20/2023 Patient is a 57 year old female complaining of low back and feet pain. Patient states that she has been having lower back , hip and feet pain. She feels a burning sensation when she stands for a long time. She has had CSI in her feet before. Aleve for the pain 2x a day and that helps some. If she lays on her left side she gets a burning sensation. When she lifts her grandchild her legs go numb.   04/17/2023 Patient states   Relevant Historical Information: None pertinent  Additional pertinent review of systems negative.   Current Outpatient Medications:    albuterol (PROAIR HFA) 108 (90 Base) MCG/ACT inhaler, Inhale 2 puffs into the lungs every 6 (six) hours as needed for wheezing or shortness of breath., Disp: 1 each, Rfl: 0   ALPRAZolam (XANAX) 0.5 MG tablet, Take 0.5 mg by mouth daily as needed for anxiety., Disp: , Rfl:    Ascorbic Acid (VITAMIN C) 100 MG tablet, Take 100 mg by mouth daily., Disp: , Rfl:    Biotin 1 MG CAPS, , Disp: , Rfl:    cholecalciferol (VITAMIN D3) 25 MCG (1000 UT) tablet, Take 2,000 Units by mouth daily., Disp: , Rfl:    Cyanocobalamin (B-12) 2500 MCG TABS, Take 2,500 mcg by mouth daily., Disp: , Rfl:    cyclobenzaprine (FLEXERIL) 5 MG tablet, Use 1 to 2 tablets (5 to 10 mg) nightly as needed for muscle spasms., Disp: 20 tablet, Rfl: 0   fluconazole (DIFLUCAN) 150 MG tablet, Take 1 tablet when finishing your antibiotic, then take a second one in 3 days if needed., Disp: 2 tablet, Rfl: 0   loratadine (CLARITIN) 10 MG tablet, 1 tablet,  Disp: , Rfl:    meloxicam (MOBIC) 15 MG tablet, Take 1 tablet (15 mg total) by mouth daily., Disp: 30 tablet, Rfl: 0   omeprazole (PRILOSEC) 40 MG capsule, Take 1 capsule (40 mg total) by mouth daily., Disp: 30 capsule, Rfl: 3   OZEMPIC, 0.25 OR 0.5 MG/DOSE, 2 MG/1.5ML SOPN, Inject 0.5 mg into the skin once a week., Disp: , Rfl:    Semaglutide,0.25 or 0.5MG /DOS, (OZEMPIC, 0.25 OR 0.5 MG/DOSE,) 2 MG/3ML SOPN, Inject 0.5 mg into the skin once a week., Disp: 9 mL, Rfl: 1   tamsulosin (FLOMAX) 0.4 MG CAPS capsule, Take 1 capsule (0.4 mg total) by mouth daily., Disp: 30 capsule, Rfl: 0   Zinc Sulfate (ZINC 15 PO), Take by mouth., Disp: , Rfl:    Objective:     There were no vitals filed for this visit.    There is no height or weight on file to calculate BMI.    Physical Exam:    ***   Electronically signed by:  Dawn Mcguire [name] 7:10 AM 04/16/23

## 2023-04-17 ENCOUNTER — Ambulatory Visit: Payer: 59 | Admitting: Sports Medicine

## 2023-04-22 ENCOUNTER — Other Ambulatory Visit: Payer: Self-pay | Admitting: Sports Medicine

## 2023-04-22 ENCOUNTER — Telehealth: Payer: Self-pay | Admitting: Sports Medicine

## 2023-04-22 DIAGNOSIS — M4316 Spondylolisthesis, lumbar region: Secondary | ICD-10-CM

## 2023-04-22 DIAGNOSIS — G8929 Other chronic pain: Secondary | ICD-10-CM

## 2023-04-22 MED ORDER — MELOXICAM 15 MG PO TABS: 15.0000 mg | ORAL_TABLET | Freq: Every day | ORAL | 0 refills | Status: DC

## 2023-04-22 NOTE — Telephone Encounter (Signed)
Refilled placed.

## 2023-04-22 NOTE — Telephone Encounter (Signed)
Pt requesting refill of Meloxicam to [facility]. Has upcoming reck but will run out before then.

## 2023-04-22 NOTE — Telephone Encounter (Signed)
Pt informed via VM

## 2023-04-30 NOTE — Progress Notes (Unsigned)
    Dawn Mcguire [name] [address] [place] [address] Phone: [phone]   Assessment and Plan:     There are no diagnoses linked to this encounter.  ***   Pertinent previous records reviewed include ***   Follow Up: ***     Subjective:   I, Dawn Mcguire, am serving as a Neurosurgeon for Doctor [employer]   Chief Complaint: low back and feet pain    HPI:    02/20/2023 Patient is a 57 year old female complaining of low back and feet pain. Patient states that she has been having lower back , hip and feet pain. She feels a burning sensation when she stands for a long time. She has had CSI in her feet before. Aleve for the pain 2x a day and that helps some. If she lays on her left side she gets a burning sensation. When she lifts her grandchild her legs go numb.   05/01/2023 Patient states  Relevant Historical Information: None pertinent  Additional pertinent review of systems negative.   Current Outpatient Medications:    albuterol (PROAIR HFA) 108 (90 Base) MCG/ACT inhaler, Inhale 2 puffs into the lungs every 6 (six) hours as needed for wheezing or shortness of breath., Disp: 1 each, Rfl: 0   ALPRAZolam (XANAX) 0.5 MG tablet, Take 0.5 mg by mouth daily as needed for anxiety., Disp: , Rfl:    Ascorbic Acid (VITAMIN C) 100 MG tablet, Take 100 mg by mouth daily., Disp: , Rfl:    Biotin 1 MG CAPS, , Disp: , Rfl:    cholecalciferol (VITAMIN D3) 25 MCG (1000 UT) tablet, Take 2,000 Units by mouth daily., Disp: , Rfl:    Cyanocobalamin (B-12) 2500 MCG TABS, Take 2,500 mcg by mouth daily., Disp: , Rfl:    cyclobenzaprine (FLEXERIL) 5 MG tablet, Use 1 to 2 tablets (5 to 10 mg) nightly as needed for muscle spasms., Disp: 20 tablet, Rfl: 0   fluconazole (DIFLUCAN) 150 MG tablet, Take 1 tablet when finishing your antibiotic, then take a second one in 3 days if needed., Disp: 2 tablet, Rfl: 0   loratadine (CLARITIN) 10 MG tablet, 1 tablet,  Disp: , Rfl:    meloxicam (MOBIC) 15 MG tablet, Take 1 tablet (15 mg total) by mouth daily., Disp: 30 tablet, Rfl: 0   omeprazole (PRILOSEC) 40 MG capsule, Take 1 capsule (40 mg total) by mouth daily., Disp: 30 capsule, Rfl: 3   OZEMPIC, 0.25 OR 0.5 MG/DOSE, 2 MG/1.5ML SOPN, Inject 0.5 mg into the skin once a week., Disp: , Rfl:    Semaglutide,0.25 or 0.5MG /DOS, (OZEMPIC, 0.25 OR 0.5 MG/DOSE,) 2 MG/3ML SOPN, Inject 0.5 mg into the skin once a week., Disp: 9 mL, Rfl: 1   tamsulosin (FLOMAX) 0.4 MG CAPS capsule, Take 1 capsule (0.4 mg total) by mouth daily., Disp: 30 capsule, Rfl: 0   Zinc Sulfate (ZINC 15 PO), Take by mouth., Disp: , Rfl:    Objective:     There were no vitals filed for this visit.    There is no height or weight on file to calculate BMI.    Physical Exam:    ***   Electronically signed by:  Dawn Mcguire [name] 7:49 AM 04/30/23

## 2023-05-01 ENCOUNTER — Ambulatory Visit: Payer: 59 | Admitting: Sports Medicine

## 2023-05-01 VITALS — BP 120/84 | HR 91 | Ht 68.0 in | Wt 246.0 lb

## 2023-05-01 DIAGNOSIS — M4316 Spondylolisthesis, lumbar region: Secondary | ICD-10-CM

## 2023-05-01 DIAGNOSIS — G8929 Other chronic pain: Secondary | ICD-10-CM

## 2023-05-01 DIAGNOSIS — M5442 Lumbago with sciatica, left side: Secondary | ICD-10-CM

## 2023-05-01 DIAGNOSIS — M5441 Lumbago with sciatica, right side: Secondary | ICD-10-CM | POA: Diagnosis not present

## 2023-05-01 NOTE — Patient Instructions (Signed)
Lumbar MRI referral  Continue meloxicam and flexeril  Follow up 4-5 days after to discuss results

## 2023-05-13 ENCOUNTER — Telehealth: Payer: Self-pay | Admitting: Sports Medicine

## 2023-05-13 DIAGNOSIS — G8929 Other chronic pain: Secondary | ICD-10-CM

## 2023-05-13 DIAGNOSIS — M4316 Spondylolisthesis, lumbar region: Secondary | ICD-10-CM

## 2023-05-13 NOTE — Telephone Encounter (Signed)
Patient called requesting a refill on: cyclobenzaprine (FLEXERIL) 5 MG tablet   Pharmacy:  [facility]

## 2023-05-14 MED ORDER — CYCLOBENZAPRINE HCL 5 MG PO TABS: ORAL_TABLET | ORAL | 0 refills | Status: DC

## 2023-05-14 NOTE — Telephone Encounter (Signed)
Refill sent.

## 2023-05-14 NOTE — Telephone Encounter (Signed)
Pt called, routing to [place] for fill.

## 2023-05-15 NOTE — Progress Notes (Signed)
Dawn Mcguire [name] [address] [place] [address] Phone: [phone]   Assessment and Plan:    1. Chronic bilateral low back pain with bilateral sciatica 2. Anterolisthesis of lumbar spine 3. Weakness of right lower extremity -Chronic with exacerbation, subsequent visit - Reviewed patient's MRI which showed grade 1 anterior listhesis, severe bilateral subarticular recess stenosis, moderate central canal stenosis at L4-L5, likely representing the cause of the patient's ongoing symptoms - We discussed that due to patient's worsening symptoms, right lower extremity weakness, I would recommend further evaluation by neurosurgery to discuss injection versus surgical intervention treatments.  Patient agreeable and prefers to see Dr. Yetta Barre, neurosurgery.  Referral sent  4. Lesion of vertebra  -New finding - Incidental finding of 1.4 cm T1 and T2 hypointense L2 vertebral body lesion.  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 Patient states that she has been having lower back , hip and feet pain. She feels a burning sensation when she stands for a long time. She has had CSI in her feet before. Aleve for the pain 2x a day and that helps some. If she lays on her left side she gets a burning sensation. When she lifts her grandchild her legs go numb.    05/01/2023 Patient states that she is the  same. The weather increases pain. Has pain and burning when doing certain motions . Meloxicam has helped with the pain .   05/29/2023 Patient states she's the same . She does feel like she is building her core up    Relevant Historical Information: None pertinent   Additional pertinent review of systems negative.   Current Outpatient Medications:    albuterol (PROAIR HFA) 108 (90 Base) MCG/ACT inhaler, Inhale 2 puffs into the lungs every 6 (six) hours as needed for wheezing or shortness of breath., Disp: 1 each, Rfl: 0   ALPRAZolam (XANAX) 0.5 MG tablet, Take 0.5 mg by mouth daily as needed for anxiety., Disp: , Rfl:    Ascorbic Acid (VITAMIN C) 100 MG tablet, Take 100 mg by mouth daily., Disp: , Rfl:    Biotin 1 MG CAPS, , Disp: , Rfl:    cholecalciferol (VITAMIN D3) 25 MCG (1000 UT) tablet, Take 2,000 Units by mouth daily., Disp: , Rfl:    Cyanocobalamin (B-12) 2500 MCG TABS, Take 2,500 mcg by mouth daily., Disp: , Rfl:    cyclobenzaprine (FLEXERIL) 5 MG tablet, Use 1 to 2 tablets (5 to 10 mg) nightly as needed for muscle spasms., Disp: 20 tablet, Rfl: 0   fluconazole (DIFLUCAN) 150 MG tablet, Take 1 tablet when finishing your antibiotic, then take a second one in 3 days if needed., Disp: 2 tablet, Rfl: 0   loratadine (CLARITIN) 10 MG tablet, 1 tablet, Disp: , Rfl:    meloxicam (MOBIC) 15 MG tablet, Take 1 tablet (15 mg total) by mouth daily., Disp: 30 tablet, Rfl:  0   omeprazole (PRILOSEC) 40 MG capsule, Take 1 capsule (40 mg total) by mouth daily., Disp: 30 capsule, Rfl: 3   OZEMPIC, 0.25 OR 0.5 MG/DOSE, 2 MG/1.5ML SOPN, Inject 0.5 mg into the skin once a week., Disp: , Rfl:    Semaglutide,0.25 or 0.5MG /DOS, (OZEMPIC, 0.25 OR 0.5 MG/DOSE,) 2 MG/3ML SOPN, Inject 0.5 mg into the skin once a week., Disp: 9 mL, Rfl: 1   tamsulosin (FLOMAX) 0.4 MG CAPS capsule, Take 1 capsule (0.4 mg total) by mouth daily., Disp: 30 capsule, Rfl: 0   Zinc Sulfate (ZINC 15 PO), Take by mouth., Disp: , Rfl:     Objective:     Vitals:   05/29/23 1010  BP: 120/82  Pulse: 84  SpO2: 97%  Weight: 244 lb (110.7 kg)  Height: 5\' 8"  (1.727 m)      Body mass index is 37.1 kg/m.    Physical Exam:     Gen: Appears well, nad, nontoxic and pleasant Psych: Alert and oriented, appropriate mood and affect Neuro: sensation intact, strength is 5/5 in upper and lower extremities, muscle tone wnl Skin: no susupicious lesions or rashes   Back - Normal skin, Spine with normal alignment and no deformity.     tenderness to L2-5 vertebral process palpation.   Lumbar paraspinous muscles are bilaterally tender and without spasm  TTP gluteal musculature Straight leg raise positive bilaterally Trendelenberg positive right Piriformis Test positive left Pain worsened with lumbar extension    Electronically signed by:  Dawn Mc[facility]ire [name] 11:15 AM 05/29/23

## 2023-05-25 ENCOUNTER — Ambulatory Visit
Admission: RE | Admit: 2023-05-25 | Discharge: 2023-05-25 | Disposition: A | Discharge status: Home / Self Care | Payer: 59 | Source: Ambulatory Visit | Attending: Sports Medicine | Admitting: Sports Medicine

## 2023-05-25 DIAGNOSIS — G8929 Other chronic pain: Secondary | ICD-10-CM

## 2023-05-25 DIAGNOSIS — M4316 Spondylolisthesis, lumbar region: Secondary | ICD-10-CM | POA: Diagnosis not present

## 2023-05-25 DIAGNOSIS — M48061 Spinal stenosis, lumbar region without neurogenic claudication: Secondary | ICD-10-CM | POA: Diagnosis not present

## 2023-05-29 ENCOUNTER — Ambulatory Visit: Payer: 59 | Admitting: Sports Medicine

## 2023-05-29 ENCOUNTER — Telehealth: Payer: Self-pay | Admitting: Sports Medicine

## 2023-05-29 ENCOUNTER — Other Ambulatory Visit: Payer: Self-pay | Admitting: Sports Medicine

## 2023-05-29 VITALS — BP 120/82 | HR 84 | Ht 68.0 in | Wt 244.0 lb

## 2023-05-29 DIAGNOSIS — M5442 Lumbago with sciatica, left side: Secondary | ICD-10-CM

## 2023-05-29 DIAGNOSIS — R29898 Other symptoms and signs involving the musculoskeletal system: Secondary | ICD-10-CM

## 2023-05-29 DIAGNOSIS — M4316 Spondylolisthesis, lumbar region: Secondary | ICD-10-CM

## 2023-05-29 DIAGNOSIS — M5441 Lumbago with sciatica, right side: Secondary | ICD-10-CM

## 2023-05-29 DIAGNOSIS — G8929 Other chronic pain: Secondary | ICD-10-CM

## 2023-05-29 DIAGNOSIS — M899 Disorder of bone, unspecified: Secondary | ICD-10-CM | POA: Diagnosis not present

## 2023-05-29 MED ORDER — CYCLOBENZAPRINE HCL 5 MG PO TABS: ORAL_TABLET | ORAL | 0 refills | Status: DC

## 2023-05-29 MED ORDER — MELOXICAM 15 MG PO TABS: 15.0000 mg | ORAL_TABLET | Freq: Every day | ORAL | 0 refills | Status: DC

## 2023-05-29 NOTE — Progress Notes (Unsigned)
Refill provided

## 2023-05-29 NOTE — Telephone Encounter (Signed)
Refill placed

## 2023-05-29 NOTE — Telephone Encounter (Signed)
Patient called asking if Dr Jean Rosenthal would refill her Meloxicam and Flexeril?

## 2023-05-29 NOTE — Patient Instructions (Signed)
Dr. Yetta Barre neurosurgery  Repeat MRI 6 months to call us if you do not hear from [place] by then  As needed follow up

## 2023-06-08 ENCOUNTER — Other Ambulatory Visit: Payer: Self-pay

## 2023-06-08 ENCOUNTER — Encounter: Payer: Self-pay | Admitting: *Deleted

## 2023-06-08 ENCOUNTER — Ambulatory Visit
Admission: EM | Admit: 2023-06-08 | Discharge: 2023-06-08 | Disposition: A | Discharge status: Home / Self Care | Payer: 59 | Attending: Internal Medicine | Admitting: Internal Medicine

## 2023-06-08 DIAGNOSIS — J019 Acute sinusitis, unspecified: Secondary | ICD-10-CM | POA: Diagnosis not present

## 2023-06-08 MED ORDER — AZITHROMYCIN 250 MG PO TABS: 250.0000 mg | ORAL_TABLET | Freq: Every day | ORAL | 0 refills | Status: DC

## 2023-06-08 NOTE — ED Triage Notes (Signed)
C/o cough productive for green mucous, bilateral ear pain with drainage, and sore throat. Symptoms started approx 8 days ago. Denies known fever

## 2023-06-08 NOTE — ED Provider Notes (Signed)
[facility]    CSN: [id number] Arrival date & time: 06/08/23  1249      History   Chief Complaint Chief Complaint  Patient presents with   Cough    HPI Dawn Mcguire is a 57 y.o. female comes to the urgent care with about a day history of nasal congestion, nasal discharge and a sore throat.  Patient's symptoms started insidiously and appeared to have gotten better after about 4 to 5 days.  Over the past few days patient has experienced worsening cough, greenish nasal discharge, bilateral ear pain and fullness and worsening sore throat.  She has ringing in the ears but denies any dizziness or spinning sensation.  She is complaining of facial pain and pressure over the sinuses and behind the eye.  No fever or chills.  Patient is a non-smoker.   HPI  Past Medical History:  Diagnosis Date   Arthritis    Carpal tunnel syndrome of right wrist    Closed displaced trimalleolar fracture of right ankle 11/20/2018   GERD (gastroesophageal reflux disease)    Psoriasis     Patient Active Problem List   Diagnosis Date Noted   History of urethral stricture 04/11/2022   Joint pain 09/27/2021   Neoplasm of uncertain behavior of skin 09/27/2021   Psoriasis 09/27/2021   Thinning hair 09/27/2021   Obesity (BMI 35.0-39.9 without comorbidity) 09/04/2021   Medication refill 07/19/2021   Other headache syndrome 07/19/2021   Other fatigue 07/19/2021   Acute cough 07/19/2021   Acute cystitis without hematuria 03/14/2021   Morbid obesity (HCC) 12/08/2020   Acute non-recurrent maxillary sinusitis 10/02/2020   Anal fissure 06/07/2019   Hematuria 06/07/2019   Left foot pain 02/11/2019   Closed displaced trimalleolar fracture of right ankle 11/20/2018   Nonspecific syndrome suggestive of viral illness 11/16/2018   Bad odor of urine 06/08/2018   Acute non-recurrent frontal sinusitis 06/08/2018   Suprapubic pressure 06/08/2018   Eustachian tube dysfunction, right 06/08/2018    Drug allergy, multiple 06/08/2018   Rhus dermatitis 01/20/2018   Dysuria 07/31/2017   Frequent UTI 07/31/2017   Abdominal pain 03/31/2017   Hx laparoscopic cholecystectomy 03/31/2017   Calculus of gallbladder without cholecystitis without obstruction 03/26/2017   Family history of colonic polyps 10/02/2016   Routine health maintenance 09/17/2012    Past Surgical History:  Procedure Laterality Date   ABDOMINAL HYSTERECTOMY     BLADDER REPAIR     BREAST SURGERY     CARPAL TUNNEL RELEASE     on the right wrist   CHOLECYSTECTOMY     INSERTION OF MESH N/A 10/16/2017   Procedure: INSERTION OF MESH;  Surgeon: Manus Rudd, MD;  Location: [facility] OR;  Service: General;  Laterality: N/A;   IR US GUIDE BX ASP/DRAIN  12/31/2017   ORIF ANKLE FRACTURE Right 11/20/2018   Procedure: OPEN REDUCTION INTERNAL FIXATION (ORIF) ANKLE FRACTURE AND SYNDESMOSIS;  Surgeon: Teryl Lucy, MD;  Location: [facility];  Service: Orthopedics;  Laterality: Right;   VENTRAL HERNIA REPAIR N/A 10/16/2017   Procedure: OPEN VENTRAL HERNIA REPAIR WITH MESH;  Surgeon: Manus Rudd, MD;  Location: [facility] OR;  Service: General;  Laterality: N/A;    OB History   No obstetric history on file.      Home Medications    Prior to Admission medications   Medication Sig Start Date End Date Taking? Authorizing Provider  Ascorbic Acid (VITAMIN C) 100 MG tablet Take 100 mg by mouth daily.   Yes [provider]  azithromycin (ZITHROMAX) 250 MG tablet Take 1 tablet (250 mg total) by mouth daily. Take first 2 tablets together, then 1 every day until finished. 06/08/23  Yes Peggye Poon, Britta Mccreedy, MD  Biotin 1 MG CAPS    Yes [provider]  cholecalciferol (VITAMIN D3) 25 MCG (1000 UT) tablet Take 2,000 Units by mouth daily.   Yes [provider]  Cyanocobalamin (B-12) 2500 MCG TABS Take 2,500 mcg by mouth daily.   Yes [provider]  cyclobenzaprine (FLEXERIL) 5 MG tablet Use 1 to 2  tablets (5 to 10 mg) nightly as needed for muscle spasms. 05/29/23  Yes Richardean Sale, DO  loratadine-pseudoephedrine (CLARITIN-D 24 HOUR) 10-240 MG 24 hr tablet Take 1 tablet by mouth daily as needed. 10/02/16  Yes [provider]  meloxicam (MOBIC) 15 MG tablet Take 1 tablet (15 mg total) by mouth daily. 05/29/23  Yes Richardean Sale, DO  omeprazole (PRILOSEC OTC) 20 MG tablet 1 tablet every day by oral route.   Yes [provider]  Turmeric (QC TUMERIC COMPLEX PO) Take by mouth.   Yes [provider]  Zinc Sulfate (ZINC 15 PO) Take by mouth.   Yes [provider]  loratadine (CLARITIN) 10 MG tablet 1 tablet    [provider]    Family History Family History  Problem Relation Age of Onset   Arthritis Mother    Heart attack Mother    High blood pressure Mother    Cancer Father    Diabetes Father    Arthritis Brother    Arthritis Brother    Arthritis Brother    Diabetes Son     Social History Social History   Tobacco Use   Smoking status: Never   Smokeless tobacco: Never  Vaping Use   Vaping status: Never Used  Substance Use Topics   Alcohol use: No   Drug use: No     Allergies   Latex, Sulfa antibiotics, Sulfasalazine, Corticosteroids, Nitrofurantoin monohyd macro, Penicillins, Prednisone, and Tramadol   Review of Systems Review of Systems As per HPI  Physical Exam Triage Vital Signs ED Triage Vitals  Encounter Vitals Group     BP 06/08/23 1307 (!) 139/94     Systolic BP Percentile --      Diastolic BP Percentile --      Pulse Rate 06/08/23 1307 86     Resp 06/08/23 1307 18     Temp 06/08/23 1307 98.5 F (36.9 C)     Temp Source 06/08/23 1307 Oral     SpO2 06/08/23 1307 97 %     Weight --      Height --      Head Circumference --      Peak Flow --      Pain Score 06/08/23 1300 7     Pain Loc --      Pain Education --      Exclude from Growth Chart --    No data found.  Updated Vital Signs BP (!)  139/94 (BP Location: Left Arm)   Pulse 86   Temp 98.5 F (36.9 C) (Oral)   Resp 18   SpO2 97%   Visual Acuity Right Eye Distance:   Left Eye Distance:   Bilateral Distance:    Right Eye Near:   Left Eye Near:    Bilateral Near:     Physical Exam Vitals and nursing note reviewed.  Constitutional:      General: She is not  in acute distress.    Appearance: She is not ill-appearing.  HENT:     Ears:     Comments: Middle ear effusion bilaterally.  Tympanic membrane is without erythema    Mouth/Throat:     Mouth: Mucous membranes are moist.     Pharynx: Posterior oropharyngeal erythema present.  Cardiovascular:     Rate and Rhythm: Normal rate and regular rhythm.     Pulses: Normal pulses.     Heart sounds: Normal heart sounds.  Pulmonary:     Effort: Pulmonary effort is normal.     Breath sounds: Normal breath sounds.  Abdominal:     General: Bowel sounds are normal.     Palpations: Abdomen is soft.  Musculoskeletal:     Cervical back: Normal range of motion and neck supple. No tenderness.  Neurological:     Mental Status: She is alert.      UC Treatments / Results  Labs (all labs ordered are listed, but only abnormal results are displayed) Labs Reviewed - No data to display  EKG   Radiology No results found.  Procedures Procedures (including critical care time)  Medications Ordered in UC Medications - No data to display  Initial Impression / Assessment and Plan / UC Course  I have reviewed the triage vital signs and the nursing notes.  Pertinent labs & imaging results that were available during my care of the patient were reviewed by me and considered in my medical decision making (see chart for details).     1.  Acute sinusitis with symptoms greater than 10 days: Azithromycin-Z-Pak Patient is advised to continue Mucinex, antihistamine, humidifier and VapoRub use Patient is advised to maintain adequate hydration Return precautions given. Final  Clinical Impressions(s) / UC Diagnoses   Final diagnoses:  Acute sinusitis with symptoms > 10 days     Discharge Instructions      Please take antibiotics as directed.  Please complete the course of antibiotics Continue using Mucinex as needed Humidifier and VapoRub use will help with nasal congestion postnasal drainage and the cough You have fluid behind both the ears but you do not have an ear infection.  I anticipate the fluid behind both the ears will improve over the coming days. Return to urgent care if you have any other concerns.   ED Prescriptions     Medication Sig Dispense Auth. Provider   azithromycin (ZITHROMAX) 250 MG tablet Take 1 tablet (250 mg total) by mouth daily. Take first 2 tablets together, then 1 every day until finished. 6 tablet Clare Fennimore, Britta Mccreedy, MD      PDMP not reviewed this encounter.   Merrilee Jansky, MD 06/08/23 [id number]

## 2023-06-08 NOTE — Discharge Instructions (Signed)
Please take antibiotics as directed.  Please complete the course of antibiotics Continue using Mucinex as needed Humidifier and VapoRub use will help with nasal congestion postnasal drainage and the cough You have fluid behind both the ears but you do not have an ear infection.  I anticipate the fluid behind both the ears will improve over the coming days. Return to urgent care if you have any other concerns.

## 2023-06-09 ENCOUNTER — Ambulatory Visit: Payer: 59

## 2023-06-13 ENCOUNTER — Encounter: Payer: Self-pay | Admitting: Family Medicine

## 2023-06-13 ENCOUNTER — Ambulatory Visit (INDEPENDENT_AMBULATORY_CARE_PROVIDER_SITE_OTHER): Payer: 59 | Admitting: Family Medicine

## 2023-06-13 VITALS — BP 128/84 | HR 76 | Temp 97.6°F | Wt 239.6 lb

## 2023-06-13 DIAGNOSIS — H60333 Swimmer's ear, bilateral: Secondary | ICD-10-CM | POA: Diagnosis not present

## 2023-06-13 DIAGNOSIS — H66003 Acute suppurative otitis media without spontaneous rupture of ear drum, bilateral: Secondary | ICD-10-CM | POA: Insufficient documentation

## 2023-06-13 DIAGNOSIS — H65193 Other acute nonsuppurative otitis media, bilateral: Secondary | ICD-10-CM | POA: Diagnosis not present

## 2023-06-13 MED ORDER — CLINDAMYCIN HCL 300 MG PO CAPS: 300.0000 mg | ORAL_CAPSULE | Freq: Three times a day (TID) | ORAL | 0 refills | Status: AC

## 2023-06-13 MED ORDER — CIPROFLOXACIN HCL 0.3 % OP SOLN: 5.0000 [drp] | Freq: Two times a day (BID) | OPHTHALMIC | 0 refills | Status: AC

## 2023-06-13 MED ORDER — SUDAFED SINUS CONGESTION 24HR 240 MG PO TB24: 1.0000 | ORAL_TABLET | Freq: Every day | ORAL | Status: DC | PRN

## 2023-06-13 MED ORDER — PREDNISONE 50 MG PO TABS: ORAL_TABLET | ORAL | 0 refills | Status: DC

## 2023-06-13 NOTE — Progress Notes (Signed)
Assessment/Plan:   Problem List Items Addressed This Visit       Nervous and Auditory   Non-recurrent acute suppurative otitis media of both ears without spontaneous rupture of tympanic membranes    Acute condition, not resolving with azithromycin treatment.  Plan:  Initiate prednisone 50?mg orally once daily for 5 days to reduce Eustachian tube inflammation. Recommend over-the-counter pseudoephedrine 240?mg once daily and flonase 2 sprays per nostril a day for a few days to reduce nasal congestion and facilitate ear drainage. Educate patient on proper nasal spray technique if choosing to retry intranasal steroids. Provide prescription for clindamycin 300 mg twice daily for 7 days as an alternative to Augmentin to the patient with a history of penicillin allergy  Start clindamycin if symptoms do not improve within 48 hours. Return to clinic if symptoms persist or worsen after initiating clindamycin.      Relevant Medications   predniSONE (DELTASONE) 50 MG tablet   clindamycin (CLEOCIN) 300 MG capsule   Pseudoephedrine HCl (SUDAFED SINUS CONGESTION 24HR) 240 MG TB24   Acute MEE (middle ear effusion), bilateral - Primary   Relevant Medications   ciprofloxacin (CILOXAN) 0.3 % ophthalmic solution   predniSONE (DELTASONE) 50 MG tablet   clindamycin (CLEOCIN) 300 MG capsule   Pseudoephedrine HCl (SUDAFED SINUS CONGESTION 24HR) 240 MG TB24   Acute swimmer's ear of both sides    Acute. not resolved with prior therapy.  Plan:  Prescribe ciprofloxacin 0.35  drops Educate patient on proper ear hygiene to prevent moisture accumulation. Advise avoiding water exposure to ears during bathing. Monitor symptoms over the next few days. Return for re-evaluation if no improvement or if symptoms worsen.      Relevant Medications   ciprofloxacin (CILOXAN) 0.3 % ophthalmic solution    There are no discontinued medications.  No follow-ups on file.    Subjective:   Encounter date:  06/13/2023  Dawn Mcguire is a 57 y.o. female who has Rhus dermatitis; Bad odor of urine; Acute non-recurrent frontal sinusitis; Suprapubic pressure; Eustachian tube dysfunction, right; Drug allergy, multiple; Nonspecific syndrome suggestive of viral illness; Closed displaced trimalleolar fracture of right ankle; Left foot pain; Anal fissure; Hematuria; Acute non-recurrent maxillary sinusitis; Morbid obesity (HCC); Acute cystitis without hematuria; Medication refill; Other headache syndrome; Other fatigue; Acute cough; Obesity (BMI 35.0-39.9 without comorbidity); Abdominal pain; Calculus of gallbladder without cholecystitis without obstruction; Dysuria; Family history of colonic polyps; Hx laparoscopic cholecystectomy; Joint pain; Neoplasm of uncertain behavior of skin; Psoriasis; Routine health maintenance; Thinning hair; Frequent UTI; History of urethral stricture; Non-recurrent acute suppurative otitis media of both ears without spontaneous rupture of tympanic membranes; Acute MEE (middle ear effusion), bilateral; and Acute swimmer's ear of both sides on their problem list..   She  has a past medical history of Arthritis, Carpal tunnel syndrome of right wrist, Closed displaced trimalleolar fracture of right ankle (11/20/2018), GERD (gastroesophageal reflux disease), and Psoriasis.Marland Kitchen   History of Present Illness:  Patient reports persistent bilateral ear congestion and muffled hearing. Symptoms began prior to visiting urgent care on Sunday, where they were diagnosed with both ears filled with fluid and prescribed azithromycin (Z-Pack), completed today. While nasal congestion has significantly improved, ear symptoms persist with no improvement in hearing. Patient experiences intermittent ear pain and a sensation of ear fullness, feeling as though the eardrums are collapsed. Reports previous yellowish, gritty ear drainage. Currently taking Mucinex DM and Claritin D. Notes that nasal sprays, including  Flonase and saline, have previously caused nosebleeds. Denies chest pain  or shortness of breath. Coughing has improved.  Review of Systems:  HEENT: Positive for ear congestion, muffled hearing, intermittent ear pain, and history of ear drainage. Denies nasal discharge due to nasal sprays causing nosebleeds. Respiratory: Positive for cough, improved. Cardiovascular: Denies chest pain. Constitutional: Denies fever, fatigue. Neurological: Denies dizziness. All other systems reviewed and are negative.    Past Surgical History:  Procedure Laterality Date   ABDOMINAL HYSTERECTOMY     BLADDER REPAIR     BREAST SURGERY     CARPAL TUNNEL RELEASE     on the right wrist   CHOLECYSTECTOMY     INSERTION OF MESH N/A 10/16/2017   Procedure: INSERTION OF MESH;  Surgeon: Manus Rudd, MD;  Location: [facility];  Service: General;  Laterality: N/A;   IR US GUIDE BX ASP/DRAIN  12/31/2017   ORIF ANKLE FRACTURE Right 11/20/2018   Procedure: OPEN REDUCTION INTERNAL FIXATION (ORIF) ANKLE FRACTURE AND SYNDESMOSIS;  Surgeon: Teryl Lucy, MD;  Location: [facility];  Service: Orthopedics;  Laterality: Right;   VENTRAL HERNIA REPAIR N/A 10/16/2017   Procedure: OPEN VENTRAL HERNIA REPAIR WITH MESH;  Surgeon: Manus Rudd, MD;  Location: [facility] OR;  Service: General;  Laterality: N/A;    Outpatient Medications Prior to Visit  Medication Sig Dispense Refill   Ascorbic Acid (VITAMIN C) 100 MG tablet Take 100 mg by mouth daily.     azithromycin (ZITHROMAX) 250 MG tablet Take 1 tablet (250 mg total) by mouth daily. Take first 2 tablets together, then 1 every day until finished. 6 tablet 0   Biotin 1 MG CAPS      cholecalciferol (VITAMIN D3) 25 MCG (1000 UT) tablet Take 2,000 Units by mouth daily.     Cyanocobalamin (B-12) 2500 MCG TABS Take 2,500 mcg by mouth daily.     cyclobenzaprine (FLEXERIL) 5 MG tablet Use 1 to 2 tablets (5 to 10 mg) nightly as needed for muscle spasms. 30 tablet 0   loratadine  (CLARITIN) 10 MG tablet 1 tablet     loratadine-pseudoephedrine (CLARITIN-D 24 HOUR) 10-240 MG 24 hr tablet Take 1 tablet by mouth daily as needed.     meloxicam (MOBIC) 15 MG tablet Take 1 tablet (15 mg total) by mouth daily. 30 tablet 0   omeprazole (PRILOSEC OTC) 20 MG tablet 1 tablet every day by oral route.     Turmeric (QC TUMERIC COMPLEX PO) Take by mouth.     Zinc Sulfate (ZINC 15 PO) Take by mouth.     No facility-administered medications prior to visit.    Family History  Problem Relation Age of Onset   Arthritis Mother    Heart attack Mother    High blood pressure Mother    Cancer Father    Diabetes Father    Arthritis Brother    Arthritis Brother    Arthritis Brother    Diabetes Son     Social History   Socioeconomic History   Marital status: Married    Spouse name: Not on file   Number of children: Not on file   Years of education: Not on file   Highest education level: Not on file  Occupational History   Not on file  Tobacco Use   Smoking status: Never   Smokeless tobacco: Never  Vaping Use   Vaping status: Never Used  Substance and Sexual Activity   Alcohol use: No   Drug use: No   Sexual activity: Yes    Birth control/protection: Surgical  Other  Topics Concern   Not on file  Social History Narrative   Not on file   Social Determinants of Health   Financial Resource Strain: Not on file  Food Insecurity: Not on file  Transportation Needs: Not on file  Physical Activity: Not on file  Stress: Not on file  Social Connections: Not on file  Intimate Partner Violence: Not on file                                                                                                  Objective:  Physical Exam: BP 128/84 (BP Location: Left Arm, Patient Position: Sitting, Cuff Size: Large)   Pulse 76   Temp 97.6 F (36.4 C) (Temporal)   Wt 239 lb 9.6 oz (108.7 kg)   SpO2 98%   BMI 36.43 kg/m     Physical Exam Constitutional:      General: She  is not in acute distress.    Appearance: Normal appearance. She is not ill-appearing or toxic-appearing.  HENT:     Head: Normocephalic and atraumatic.     Right Ear: Drainage and tenderness present. A middle ear effusion is present. Tympanic membrane is injected, erythematous and bulging.     Left Ear: Drainage and tenderness present. A middle ear effusion is present. Tympanic membrane is injected, erythematous and bulging.     Ears:     Comments: xternal auditory canals are erythematous and tender to manipulation bilaterally.     Nose: Nose normal. No congestion.     Mouth/Throat:     Mouth: Mucous membranes are moist.     Pharynx: Oropharynx is clear.  Eyes:     General: No scleral icterus.    Extraocular Movements: Extraocular movements intact.  Cardiovascular:     Rate and Rhythm: Normal rate and regular rhythm.     Pulses: Normal pulses.     Heart sounds: Normal heart sounds.  Pulmonary:     Effort: Pulmonary effort is normal. No respiratory distress.     Breath sounds: Normal breath sounds.  Abdominal:     General: Abdomen is flat. Bowel sounds are normal.     Palpations: Abdomen is soft.  Musculoskeletal:        General: Normal range of motion.  Lymphadenopathy:     Cervical: No cervical adenopathy.  Skin:    General: Skin is warm and dry.     Findings: No rash.  Neurological:     General: No focal deficit present.     Mental Status: She is alert and oriented to person, place, and time. Mental status is at baseline.  Psychiatric:        Mood and Affect: Mood normal.        Behavior: Behavior normal.        Thought Content: Thought content normal.        Judgment: Judgment normal.     MR Lumbar Spine Wo Contrast  Result Date: 05/29/2023 CLINICAL DATA:  low back pain.  Bilateral hip, leg and buttock pain. EXAM: MRI LUMBAR SPINE WITHOUT CONTRAST TECHNIQUE: Multiplanar, multisequence MR imaging of the  lumbar spine was performed. No intravenous contrast was  administered. COMPARISON:  Lumbar radiographs 02/20/2023. CT abdomen/pelvis November 25, 2017. FINDINGS: Segmentation:  Standard. Alignment:  Grade 1 anterolisthesis of L4 on L5. Vertebrae: Degenerative/discogenic endplate signal changes at L4-L5, eccentric to the right. No specific evidence of acute fracture or discitis/osteomyelitis. Approximately 1.4 cm T1 and T2 hypointense L2 vertebral body lesion (see series 5, image 8). Conus medullaris and cauda equina: Conus extends to the L1-L2 level. Conus appears normal. Paraspinal and other soft tissues: Unremarkable. Disc levels: T12-L1: No significant disc protrusion, foraminal stenosis, or canal stenosis. L1-L2: No significant disc protrusion, foraminal stenosis, or canal stenosis. L2-L3: Mild disc bulge.  No significant stenosis. L3-L4: Disc bulging and ligamentum flavum thickening with facet arthropathy. Patent canal and foramina. L4-L5: Degenerative disc height loss and desiccation. Degenerative grade 1 anterolisthesis of L4 on L5. Uncovering the disc and disc bulging. Moderate to severe bilateral facet arthropathy and ligamentum flavum thickening. Resulting severe bilateral subarticular recess stenosis with moderate central canal stenosis. Mild-to-moderate right and mild left foraminal stenosis. L5-S1: Left greater than right facet arthropathy. No significant canal or foraminal stenosis. IMPRESSION: 1. At L4-L5, grade 1 anterolisthesis with severe bilateral subarticular recess stenosis, moderate central canal stenosis, and mild to moderate right and mild left foraminal stenosis. 2. Approximately 1.4 cm T1 and T2 hypointense L2 vertebral body lesion. This most likely represents an atypical vertebral venous malformation; however, given indeterminate signal characteristics recommend follow-up MRI of the lumbar spine with contrast in 6 months to ensure stability and exclude malignancy. Electronically Signed   By: Feliberto Harts M.D.   On: 05/29/2023 09:46    No  results found for this or any previous visit (from the past 2160 hour(s)).      Garner Nash, MD, MS

## 2023-06-13 NOTE — Assessment & Plan Note (Signed)
Acute. not resolved with prior therapy.  Plan:  Prescribe ciprofloxacin 0.35  drops Educate patient on proper ear hygiene to prevent moisture accumulation. Advise avoiding water exposure to ears during bathing. Monitor symptoms over the next few days. Return for re-evaluation if no improvement or if symptoms worsen.

## 2023-06-13 NOTE — Assessment & Plan Note (Signed)
Acute condition, not resolving with azithromycin treatment.  Plan:  Initiate prednisone 50?mg orally once daily for 5 days to reduce Eustachian tube inflammation. Recommend over-the-counter pseudoephedrine 240?mg once daily and flonase 2 sprays per nostril a day for a few days to reduce nasal congestion and facilitate ear drainage. Educate patient on proper nasal spray technique if choosing to retry intranasal steroids. Provide prescription for clindamycin 300 mg twice daily for 7 days as an alternative to Augmentin to the patient with a history of penicillin allergy  Start clindamycin if symptoms do not improve within 48 hours. Return to clinic if symptoms persist or worsen after initiating clindamycin.

## 2023-06-19 DIAGNOSIS — M48062 Spinal stenosis, lumbar region with neurogenic claudication: Secondary | ICD-10-CM | POA: Diagnosis not present

## 2023-06-19 DIAGNOSIS — M4316 Spondylolisthesis, lumbar region: Secondary | ICD-10-CM | POA: Diagnosis not present

## 2023-07-03 ENCOUNTER — Telehealth: Payer: Self-pay | Admitting: Sports Medicine

## 2023-07-03 ENCOUNTER — Other Ambulatory Visit: Payer: Self-pay | Admitting: Sports Medicine

## 2023-07-03 ENCOUNTER — Ambulatory Visit: Payer: 59

## 2023-07-03 DIAGNOSIS — G8929 Other chronic pain: Secondary | ICD-10-CM

## 2023-07-03 DIAGNOSIS — M4316 Spondylolisthesis, lumbar region: Secondary | ICD-10-CM

## 2023-07-03 MED ORDER — MELOXICAM 15 MG PO TABS: 15.0000 mg | ORAL_TABLET | Freq: Every day | ORAL | 0 refills | Status: DC

## 2023-07-03 MED ORDER — CYCLOBENZAPRINE HCL 5 MG PO TABS: ORAL_TABLET | ORAL | 0 refills | Status: DC

## 2023-07-03 NOTE — Telephone Encounter (Signed)
Patient called requesting a refill on Felxeril and Meloxicam.  She also wanted to let Dr Jean Rosenthal know that her surgery is scheduled fir January 6th.

## 2023-07-03 NOTE — Telephone Encounter (Signed)
Refill sent.

## 2023-07-14 NOTE — Therapy (Signed)
OUTPATIENT PHYSICAL THERAPY THORACOLUMBAR EVALUATION   Patient Name: Dawn Mcguire MRN: [id number] DOB:April 03, 1966, 57 y.o., female Today's Date: 07/16/2023  END OF SESSION:  PT End of Session - 07/16/23 1332     Visit Number 1    PT Start Time 1317    PT Stop Time 1350    PT Time Calculation (min) 33 min    Activity Tolerance Patient tolerated treatment well    Behavior During Therapy [facility] for tasks assessed/performed             Past Medical History:  Diagnosis Date   Arthritis    Carpal tunnel syndrome of right wrist    Closed displaced trimalleolar fracture of right ankle 11/20/2018   GERD (gastroesophageal reflux disease)    Psoriasis    Past Surgical History:  Procedure Laterality Date   ABDOMINAL HYSTERECTOMY     BLADDER REPAIR     BREAST SURGERY     CARPAL TUNNEL RELEASE     on the right wrist   CHOLECYSTECTOMY     INSERTION OF MESH N/A 10/16/2017   Procedure: INSERTION OF MESH;  Surgeon: Manus Rudd, MD;  Location: [facility] OR;  Service: General;  Laterality: N/A;   IR US GUIDE BX ASP/DRAIN  12/31/2017   ORIF ANKLE FRACTURE Right 11/20/2018   Procedure: OPEN REDUCTION INTERNAL FIXATION (ORIF) ANKLE FRACTURE AND SYNDESMOSIS;  Surgeon: Teryl Lucy, MD;  Location: [facility];  Service: Orthopedics;  Laterality: Right;   VENTRAL HERNIA REPAIR N/A 10/16/2017   Procedure: OPEN VENTRAL HERNIA REPAIR WITH MESH;  Surgeon: Manus Rudd, MD;  Location: [facility] OR;  Service: General;  Laterality: N/A;   Patient Active Problem List   Diagnosis Date Noted   Non-recurrent acute suppurative otitis media of both ears without spontaneous rupture of tympanic membranes 06/13/2023   Acute MEE (middle ear effusion), bilateral 06/13/2023   Acute swimmer's ear of both sides 06/13/2023   History of urethral stricture 04/11/2022   Joint pain 09/27/2021   Neoplasm of uncertain behavior of skin 09/27/2021   Psoriasis 09/27/2021   Thinning hair 09/27/2021   Obesity (BMI  35.0-39.9 without comorbidity) 09/04/2021   Medication refill 07/19/2021   Other headache syndrome 07/19/2021   Other fatigue 07/19/2021   Acute cough 07/19/2021   Acute cystitis without hematuria 03/14/2021   Morbid obesity (HCC) 12/08/2020   Acute non-recurrent maxillary sinusitis 10/02/2020   Anal fissure 06/07/2019   Hematuria 06/07/2019   Left foot pain 02/11/2019   Closed displaced trimalleolar fracture of right ankle 11/20/2018   Nonspecific syndrome suggestive of viral illness 11/16/2018   Bad odor of urine 06/08/2018   Acute non-recurrent frontal sinusitis 06/08/2018   Suprapubic pressure 06/08/2018   Eustachian tube dysfunction, right 06/08/2018   Drug allergy, multiple 06/08/2018   Rhus dermatitis 01/20/2018   Dysuria 07/31/2017   Frequent UTI 07/31/2017   Abdominal pain 03/31/2017   Hx laparoscopic cholecystectomy 03/31/2017   Calculus of gallbladder without cholecystitis without obstruction 03/26/2017   Family history of colonic polyps 10/02/2016   Routine health maintenance 09/17/2012    PCP: Mliss Sax, MD   REFERRING PROVIDER: Arman Bogus, MD   REFERRING DIAG: [phone] (ICD-10-CM) - Spinal stenosis, lumbar region with neurogenic claudication   Rationale for Evaluation and Treatment: Rehabilitation  THERAPY DIAG:  Radiculopathy, lumbar region  Muscle weakness (generalized)  ONSET DATE: progressively worsened over last month, significantly worsened last week.   SUBJECTIVE:  SUBJECTIVE STATEMENT: Patient reports she has started to lose control of her bladder, wet herself several times, has burning and numbness down her leg, has constant pain down her legs, her R knee has started buckle, L foot is now numb, feels like she is dragging her R leg.  L leg burns  and is L foot is numb.  Has to go up steps with L foot first pulling R leg.  Almost collapsed at grocery store, had to hold onto grocery cart to keep from falling.  This has progressed significantly over the last week.  Almost lost control over her bowels.    PERTINENT HISTORY:  Arthritis, psoriasis, GERD, lumbar stenosis, anterolisthesis  PAIN:  Are you having pain? Yes: NPRS scale: 6/10 Pain location: low back radiating down both legs Pain description: numbness, burning, tingling down both legs Aggravating factors: standing, walking, laying flat Relieving factors: sitting down  PRECAUTIONS: None  RED FLAGS: Bowel or bladder incontinence: Yes: bladder and Cauda equina syndrome: Yes: numbness in saddle region    WEIGHT BEARING RESTRICTIONS: No  FALLS:  Has patient fallen in last 6 months?  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 At L4-L5, grade 1 anterolisthesis with severe bilateral subarticular recess stenosis, moderate central canal stenosis, and mild to moderate right and mild left foraminal stenosis. 2. Approximately 1.4 cm T1 and T2 hypointense L2 vertebral body lesion. This most likely represents an atypical vertebral venous malformation; however, given indeterminate signal characteristics recommend follow-up MRI of the lumbar spine with contrast in 6 months to ensure stability and exclude malignancy.  PATIENT SURVEYS:  Modified Oswestry 35/50   COGNITION: Overall cognitive status: Within functional limits for  tasks assessed     SENSATION: Light touch: Impaired   POSTURE: No Significant postural limitations  PALPATION: Tenderness bil lumbar paraspinals  LOWER EXTREMITY MMT:    MMT Right eval Left eval  Hip flexion 3 4  Hip extension    Hip abduction 4+ 4+  Hip adduction 4+ 4+  Knee flexion 3+p! 5  Knee extension 3+ 5  Ankle dorsiflexion 3+ 5  Ankle plantarflexion 5 5   (Blank rows = not tested)  LUMBAR SPECIAL TESTS:  Slump test: Positive  GAIT: Distance walked: 34' Assistive device utilized: None Level of assistance: Complete Independence Comments: antalgic  TODAY'S TREATMENT:  DATE:   07/16/23 Evaluation     PATIENT EDUCATION:  Education details: findings, precautions, go to ER if symptoms worsen Person educated: Patient Education method: Explanation Education comprehension: verbalized understanding  HOME EXERCISE PROGRAM: NA  ASSESSMENT:  CLINICAL IMPRESSION: Yared Herpin  is a 58 y.o. female who was seen today for physical therapy evaluation and treatment for lumbar stenosis.    Patient presents with significant low back pain with numbness/tingling and burning down both legs as well and numbness in saddle region and new onset bladder incontinence, and R leg weakness.  She reports symptoms have worsened significantly over the last week and she has almost fallen due to R leg weakness.  Due to presence of red flags, she is not appropriate for PT and is being referred back to her neurosurgeon.  Advised if symptoms worsen to go to ER.     OBJECTIVE IMPAIRMENTS: Abnormal gait, decreased activity tolerance, decreased balance, decreased mobility, difficulty walking, decreased strength, impaired sensation, and pain.   ACTIVITY LIMITATIONS: lifting, bending, sitting, standing, sleeping, stairs, transfers, bed mobility, continence, and locomotion  level  PARTICIPATION LIMITATIONS: meal prep, cleaning, laundry, driving, shopping, community activity, and occupation  PERSONAL FACTORS: Time since onset of injury/illness/exacerbation and 1-2 comorbidities: Arthritis, psoriasis, GERD, lumbar stenosis, anterolisthesis  are also affecting patient's functional outcome.   REHAB POTENTIAL: Poor due to presence of red flags  CLINICAL DECISION MAKING: Unstable/unpredictable  EVALUATION COMPLEXITY: Low   GOALS: Goals reviewed with patient? Yes  SHORT TERM GOALS: Target date:  07/16/23    Patient will understand when to seek emergency care.  Baseline: educated Goal status: MET PLAN:  PT FREQUENCY: one time visit  PT DURATION:  1 sessions  PLANNED INTERVENTIONS: 97110-Therapeutic exercises, 97530- Therapeutic activity, [id number]- Neuromuscular re-education, 97535- Self Care, and [id number]- Manual therapy.  PLAN FOR NEXT SESSION: NA   Jena Gauss, PT 07/16/2023, 2:15 PM

## 2023-07-16 ENCOUNTER — Encounter: Payer: Self-pay | Admitting: Physical Therapy

## 2023-07-16 ENCOUNTER — Other Ambulatory Visit: Payer: Self-pay

## 2023-07-16 ENCOUNTER — Ambulatory Visit: Payer: 59 | Attending: Neurological Surgery | Admitting: Physical Therapy

## 2023-07-16 DIAGNOSIS — M5416 Radiculopathy, lumbar region: Secondary | ICD-10-CM | POA: Insufficient documentation

## 2023-07-16 DIAGNOSIS — M6281 Muscle weakness (generalized): Secondary | ICD-10-CM | POA: Insufficient documentation

## 2023-07-17 DIAGNOSIS — Z6835 Body mass index (BMI) 35.0-35.9, adult: Secondary | ICD-10-CM | POA: Diagnosis not present

## 2023-07-17 DIAGNOSIS — M4316 Spondylolisthesis, lumbar region: Secondary | ICD-10-CM | POA: Diagnosis not present

## 2023-07-29 ENCOUNTER — Telehealth: Payer: Self-pay | Admitting: Sports Medicine

## 2023-07-29 NOTE — Telephone Encounter (Signed)
 Patient called requesting a refill on: cyclobenzaprine (FLEXERIL) 5 MG tablet  meloxicam (MOBIC) 15 MG tablet   Pharmacy: [facility]  *Patient states that she is still waiting to have surgery. It will probably be the end of January.

## 2023-07-31 ENCOUNTER — Other Ambulatory Visit: Payer: Self-pay | Admitting: Sports Medicine

## 2023-07-31 DIAGNOSIS — G8929 Other chronic pain: Secondary | ICD-10-CM

## 2023-07-31 DIAGNOSIS — M4316 Spondylolisthesis, lumbar region: Secondary | ICD-10-CM

## 2023-07-31 MED ORDER — MELOXICAM 15 MG PO TABS: 15.0000 mg | ORAL_TABLET | Freq: Every day | ORAL | 0 refills | Status: DC

## 2023-07-31 MED ORDER — CYCLOBENZAPRINE HCL 5 MG PO TABS: ORAL_TABLET | ORAL | 0 refills | Status: DC

## 2023-07-31 NOTE — Progress Notes (Unsigned)
 Refill placed

## 2023-08-07 ENCOUNTER — Other Ambulatory Visit: Payer: Self-pay | Admitting: Neurological Surgery

## 2023-08-20 DIAGNOSIS — M4316 Spondylolisthesis, lumbar region: Secondary | ICD-10-CM | POA: Diagnosis not present

## 2023-08-25 NOTE — Pre-Procedure Instructions (Signed)
Surgical Instructions   Your procedure is scheduled on August 29, 2023. Report to [facility] Main Entrance "A" at 8:30 A.M., then check in with the Admitting office. Any questions or running late day of surgery: call [phone]  Questions prior to your surgery date: call [phone], Monday-Friday, 8am-4pm. If you experience any cold or flu symptoms such as cough, fever, chills, shortness of breath, etc. between now and your scheduled surgery, please notify us at the above number.     Remember:  Do not eat or drink after midnight the night before your surgery    Take these medicines the morning of surgery with A SIP OF WATER: acetaminophen (TYLENOL)  omeprazole (PRILOSEC OTC)    May take these medicines IF NEEDED: cyclobenzaprine (FLEXERIL)    One week prior to surgery, STOP taking any Aspirin (unless otherwise instructed by your surgeon) Aleve, Naproxen, Ibuprofen, Motrin, Advil, Goody's, BC's, all herbal medications, fish oil, and non-prescription vitamins. This includes your medication: meloxicam (MOBIC)                      Do NOT Smoke (Tobacco/Vaping) for 24 hours prior to your procedure.  If you use a CPAP at night, you may bring your mask/headgear for your overnight stay.   You will be asked to remove any contacts, glasses, piercing's, hearing aid's, dentures/partials prior to surgery. Please bring cases for these items if needed.    Patients discharged the day of surgery will not be allowed to drive home, and someone needs to stay with them for 24 hours.  SURGICAL WAITING ROOM VISITATION Patients may have no more than 2 support people in the waiting area - these visitors may rotate.   Pre-op nurse will coordinate an appropriate time for 1 ADULT support person, who may not rotate, to accompany patient in pre-op.  Children under the age of 21 must have an adult with them who is not the patient and must remain in the main waiting area with an adult.  If the patient  needs to stay at the hospital during part of their recovery, the visitor guidelines for inpatient rooms apply.  Please refer to the [facility] website for the visitor guidelines for any additional information.   If you received a COVID test during your pre-op visit  it is requested that you wear a mask when out in public, stay away from anyone that may not be feeling well and notify your surgeon if you develop symptoms. If you have been in contact with anyone that has tested positive in the last 10 days please notify you surgeon.      Pre-operative 5 CHG Bathing Instructions   You can play a key role in reducing the risk of infection after surgery. Your skin needs to be as free of germs as possible. You can reduce the number of germs on your skin by washing with CHG (chlorhexidine gluconate) soap before surgery. CHG is an antiseptic soap that kills germs and continues to kill germs even after washing.   DO NOT use if you have an allergy to chlorhexidine/CHG or antibacterial soaps. If your skin becomes reddened or irritated, stop using the CHG and notify one of our RNs at [phone].   Please shower with the CHG soap starting 4 days before surgery using the following schedule:     Please keep in mind the following:  DO NOT shave, including legs and underarms, starting the day of your first shower.   You may  shave your face at any point before/day of surgery.  Place clean sheets on your bed the day you start using CHG soap. Use a clean washcloth (not used since being washed) for each shower. DO NOT sleep with pets once you start using the CHG.   CHG Shower Instructions:  Wash your face and private area with normal soap. If you choose to wash your hair, wash first with your normal shampoo.  After you use shampoo/soap, rinse your hair and body thoroughly to remove shampoo/soap residue.  Turn the water OFF and apply about 3 tablespoons (45 ml) of CHG soap to a CLEAN washcloth.  Apply CHG  soap ONLY FROM YOUR NECK DOWN TO YOUR TOES (washing for 3-5 minutes)  DO NOT use CHG soap on face, private areas, open wounds, or sores.  Pay special attention to the area where your surgery is being performed.  If you are having back surgery, having someone wash your back for you may be helpful. Wait 2 minutes after CHG soap is applied, then you may rinse off the CHG soap.  Pat dry with a clean towel  Put on clean clothes/pajamas   If you choose to wear lotion, please use ONLY the CHG-compatible lotions that are listed below.  Additional instructions for the day of surgery: DO NOT APPLY any lotions, deodorants, cologne, or perfumes.   Do not bring valuables to the hospital. [facility] is not responsible for any belongings/valuables. Do not wear nail polish, gel polish, artificial nails, or any other type of covering on natural nails (fingers and toes) Do not wear jewelry or makeup Put on clean/comfortable clothes.  Please brush your teeth.  Ask your nurse before applying any prescription medications to the skin.     CHG Compatible Lotions   Aveeno Moisturizing lotion  Cetaphil Moisturizing Cream  Cetaphil Moisturizing Lotion  Clairol Herbal Essence Moisturizing Lotion, Dry Skin  Clairol Herbal Essence Moisturizing Lotion, Extra Dry Skin  Clairol Herbal Essence Moisturizing Lotion, Normal Skin  Curel Age Defying Therapeutic Moisturizing Lotion with Alpha Hydroxy  Curel Extreme Care Body Lotion  Curel Soothing Hands Moisturizing Hand Lotion  Curel Therapeutic Moisturizing Cream, Fragrance-Free  Curel Therapeutic Moisturizing Lotion, Fragrance-Free  Curel Therapeutic Moisturizing Lotion, Original Formula  Eucerin Daily Replenishing Lotion  Eucerin Dry Skin Therapy Plus Alpha Hydroxy Crme  Eucerin Dry Skin Therapy Plus Alpha Hydroxy Lotion  Eucerin Original Crme  Eucerin Original Lotion  Eucerin Plus Crme Eucerin Plus Lotion  Eucerin TriLipid Replenishing Lotion  Keri  Anti-Bacterial Hand Lotion  Keri Deep Conditioning Original Lotion Dry Skin Formula Softly Scented  Keri Deep Conditioning Original Lotion, Fragrance Free Sensitive Skin Formula  Keri Lotion Fast Absorbing Fragrance Free Sensitive Skin Formula  Keri Lotion Fast Absorbing Softly Scented Dry Skin Formula  Keri Original Lotion  Keri Skin Renewal Lotion Keri Silky Smooth Lotion  Keri Silky Smooth Sensitive Skin Lotion  Nivea Body Creamy Conditioning Oil  Nivea Body Extra Enriched Lotion  Nivea Body Original Lotion  Nivea Body Sheer Moisturizing Lotion Nivea Crme  Nivea Skin Firming Lotion  NutraDerm 30 Skin Lotion  NutraDerm Skin Lotion  NutraDerm Therapeutic Skin Cream  NutraDerm Therapeutic Skin Lotion  ProShield Protective Hand Cream  Provon moisturizing lotion  Please read over the following fact sheets that you were given.

## 2023-08-26 ENCOUNTER — Other Ambulatory Visit: Payer: Self-pay

## 2023-08-26 ENCOUNTER — Encounter (HOSPITAL_COMMUNITY)
Admission: RE | Admit: 2023-08-26 | Discharge: 2023-08-26 | Disposition: A | Discharge status: Home / Self Care | Payer: 59 | Source: Ambulatory Visit | Attending: Neurological Surgery | Admitting: Neurological Surgery

## 2023-08-26 ENCOUNTER — Encounter (HOSPITAL_COMMUNITY): Payer: Self-pay

## 2023-08-26 VITALS — BP 149/99 | HR 100 | Temp 98.4°F | Resp 17 | Ht 68.5 in | Wt 229.8 lb

## 2023-08-26 DIAGNOSIS — Z01812 Encounter for preprocedural laboratory examination: Secondary | ICD-10-CM | POA: Diagnosis present

## 2023-08-26 DIAGNOSIS — Z01818 Encounter for other preprocedural examination: Secondary | ICD-10-CM

## 2023-08-26 LAB — BASIC METABOLIC PANEL
Anion gap: 10 (ref 5–15)
BUN: 14 mg/dL (ref 6–20)
CO2: 25 mmol/L (ref 22–32)
Calcium: 9.5 mg/dL (ref 8.9–10.3)
Chloride: 103 mmol/L (ref 98–111)
Creatinine, Ser: 0.71 mg/dL (ref 0.44–1.00)
GFR, Estimated: >60 mL/min (ref >60)
Glucose, Bld: 100 mg/dL — ABNORMAL HIGH (ref 70–99)
Potassium: 4.1 mmol/L (ref 3.5–5.1)
Sodium: 138 mmol/L (ref 135–145)

## 2023-08-26 LAB — TYPE AND SCREEN
ABO/RH(D): O POS
Antibody Screen: NEGATIVE

## 2023-08-26 LAB — CBC
HCT: 45.4 % (ref 36.0–46.0)
Hemoglobin: 15.1 g/dL — ABNORMAL HIGH (ref 12.0–15.0)
MCH: 30.8 pg (ref 26.0–34.0)
MCHC: 33.3 g/dL (ref 30.0–36.0)
MCV: 92.5 fL (ref 80.0–100.0)
Platelets: 306 10*3/uL (ref 150–400)
RBC: 4.91 MIL/uL (ref 3.87–5.11)
RDW: 12.4 % (ref 11.5–15.5)
WBC: 10.6 10*3/uL — ABNORMAL HIGH (ref 4.0–10.5)
nRBC: 0 % (ref 0.0–0.2)

## 2023-08-26 LAB — PROTIME-INR
INR: 0.9 (ref 0.8–1.2)
Prothrombin Time: 12.8 s (ref 11.4–15.2)

## 2023-08-26 LAB — SURGICAL PCR SCREEN
MRSA, PCR: NEGATIVE
Staphylococcus aureus: NEGATIVE

## 2023-08-26 NOTE — Progress Notes (Signed)
PCP - Dr. Nadene Rubins Cardiologist - Denies  PPM/ICD - Denies Device Orders - n/a Rep Notified - n/a  Chest x-ray - n/a EKG - n/a Stress Test - Denies ECHO - Denies Cardiac Cath - Denies  Sleep Study - Denies CPAP - N/A  NON-Diabetic  Blood Thinner Instructions: Denies Aspirin Instructions: Denies - instructed to not take any prior to surgery  ERAS Protcol - NPO PRE-SURGERY Ensure or G2- No  COVID TEST- No   Anesthesia review: No  Patient denies shortness of breath, fever, cough and chest pain at PAT appointment. Patient denies any respiratory issues at this time.    All instructions explained to the patient, with a verbal understanding of the material. Patient agrees to go over the instructions while at home for a better understanding. Patient also instructed to self quarantine after being tested for COVID-19. The opportunity to ask questions was provided.

## 2023-08-29 ENCOUNTER — Ambulatory Visit (HOSPITAL_BASED_OUTPATIENT_CLINIC_OR_DEPARTMENT_OTHER): Payer: 59 | Admitting: Anesthesiology

## 2023-08-29 ENCOUNTER — Ambulatory Visit (HOSPITAL_COMMUNITY): Payer: 59 | Admitting: Anesthesiology

## 2023-08-29 ENCOUNTER — Observation Stay (HOSPITAL_COMMUNITY)
Admission: RE | Admit: 2023-08-29 | Discharge: 2023-08-29 | Disposition: A | Discharge status: Home / Self Care | Payer: 59 | Attending: Neurological Surgery | Admitting: Neurological Surgery

## 2023-08-29 ENCOUNTER — Other Ambulatory Visit: Payer: Self-pay

## 2023-08-29 ENCOUNTER — Encounter (HOSPITAL_COMMUNITY)
Admission: RE | Disposition: A | Discharge status: Home / Self Care | Payer: Self-pay | Source: Home / Self Care | Attending: Neurological Surgery

## 2023-08-29 ENCOUNTER — Ambulatory Visit (HOSPITAL_COMMUNITY): Payer: 59

## 2023-08-29 ENCOUNTER — Encounter (HOSPITAL_COMMUNITY): Payer: Self-pay | Admitting: Neurological Surgery

## 2023-08-29 DIAGNOSIS — Z91040 Latex allergy status: Secondary | ICD-10-CM | POA: Diagnosis not present

## 2023-08-29 DIAGNOSIS — M5416 Radiculopathy, lumbar region: Secondary | ICD-10-CM | POA: Insufficient documentation

## 2023-08-29 DIAGNOSIS — M4316 Spondylolisthesis, lumbar region: Secondary | ICD-10-CM | POA: Diagnosis present

## 2023-08-29 DIAGNOSIS — Z981 Arthrodesis status: Secondary | ICD-10-CM | POA: Diagnosis not present

## 2023-08-29 DIAGNOSIS — M48061 Spinal stenosis, lumbar region without neurogenic claudication: Secondary | ICD-10-CM | POA: Diagnosis not present

## 2023-08-29 DIAGNOSIS — G8929 Other chronic pain: Secondary | ICD-10-CM

## 2023-08-29 HISTORY — PX: POSTERIOR FUSION PEDICLE SCREW PLACEMENT: SHX2186

## 2023-08-29 LAB — ABO/RH: ABO/RH(D): O POS

## 2023-08-29 SURGERY — POSTERIOR LUMBAR FUSION 1 LEVEL
Anesthesia: General | Site: Back

## 2023-08-29 MED ORDER — ORAL CARE MOUTH RINSE: 15.0000 mL | Freq: Once | OROMUCOSAL | Status: AC

## 2023-08-29 MED ORDER — ONDANSETRON HCL 4 MG PO TABS
4.0000 mg | ORAL_TABLET | Freq: Four times a day (QID) | ORAL | Status: DC | PRN
Start: 2023-08-29 — End: 2023-08-29

## 2023-08-29 MED ORDER — BIOTIN 1 MG PO CAPS: 1.0000 mg | ORAL_CAPSULE | Freq: Every day | ORAL | Status: DC

## 2023-08-29 MED ORDER — OMEPRAZOLE MAGNESIUM 20 MG PO TBEC
20.0000 mg | DELAYED_RELEASE_TABLET | Freq: Every day | ORAL | Status: DC
  Filled 2023-08-29: qty 1

## 2023-08-29 MED ORDER — ACETAMINOPHEN 500 MG PO TABS: 1000.0000 mg | ORAL_TABLET | ORAL | Status: DC

## 2023-08-29 MED ORDER — OXYCODONE-ACETAMINOPHEN 5-325 MG PO TABS: 1.0000 | ORAL_TABLET | ORAL | 0 refills | Status: DC | PRN

## 2023-08-29 MED ORDER — VITAMIN C 500 MG PO TABS
1000.0000 mg | ORAL_TABLET | Freq: Every day | ORAL | Status: DC
Start: 2023-08-30 — End: 2023-08-29

## 2023-08-29 MED ORDER — THROMBIN 20000 UNITS EX SOLR
CUTANEOUS | Status: AC
  Filled 2023-08-29: qty 20000

## 2023-08-29 MED ORDER — OMEPRAZOLE 20 MG PO CPDR: 20.0000 mg | DELAYED_RELEASE_CAPSULE | Freq: Every day | ORAL | Status: DC

## 2023-08-29 MED ORDER — DEXAMETHASONE SODIUM PHOSPHATE 10 MG/ML IJ SOLN
INTRAMUSCULAR | Status: DC | PRN
  Administered 2023-08-29: 10 mg via INTRAVENOUS

## 2023-08-29 MED ORDER — FENTANYL CITRATE (PF) 250 MCG/5ML IJ SOLN
INTRAMUSCULAR | Status: DC | PRN
  Administered 2023-08-29: 50 ug via INTRAVENOUS
  Administered 2023-08-29 (×2): 100 ug via INTRAVENOUS

## 2023-08-29 MED ORDER — FENTANYL CITRATE (PF) 250 MCG/5ML IJ SOLN
INTRAMUSCULAR | Status: AC
  Filled 2023-08-29: qty 5

## 2023-08-29 MED ORDER — SUGAMMADEX SODIUM 200 MG/2ML IV SOLN
INTRAVENOUS | Status: DC | PRN
  Administered 2023-08-29: 200 mg via INTRAVENOUS

## 2023-08-29 MED ORDER — HYDROMORPHONE HCL 1 MG/ML IJ SOLN
INTRAMUSCULAR | Status: AC
  Filled 2023-08-29: qty 1

## 2023-08-29 MED ORDER — METHOCARBAMOL 1000 MG/10ML IJ SOLN
INTRAMUSCULAR | Status: DC | PRN
  Administered 2023-08-29: 1000 mg via INTRAVENOUS

## 2023-08-29 MED ORDER — ONDANSETRON HCL 4 MG/2ML IJ SOLN: 4.0000 mg | Freq: Four times a day (QID) | INTRAMUSCULAR | Status: DC | PRN

## 2023-08-29 MED ORDER — THROMBIN 20000 UNITS EX SOLR
CUTANEOUS | Status: DC | PRN
  Administered 2023-08-29: 20 mL

## 2023-08-29 MED ORDER — CEFAZOLIN SODIUM-DEXTROSE 2-4 GM/100ML-% IV SOLN
2.0000 g | INTRAVENOUS | Status: AC
  Administered 2023-08-29: 2 g via INTRAVENOUS

## 2023-08-29 MED ORDER — MIDAZOLAM HCL 2 MG/2ML IJ SOLN
INTRAMUSCULAR | Status: DC | PRN
  Administered 2023-08-29: 2 mg via INTRAVENOUS

## 2023-08-29 MED ORDER — CYCLOBENZAPRINE HCL 10 MG PO TABS: 10.0000 mg | ORAL_TABLET | Freq: Three times a day (TID) | ORAL | Status: DC | PRN

## 2023-08-29 MED ORDER — SODIUM CHLORIDE 0.9% FLUSH: 3.0000 mL | INTRAVENOUS | Status: DC | PRN

## 2023-08-29 MED ORDER — PROPOFOL 10 MG/ML IV BOLUS
INTRAVENOUS | Status: DC | PRN
  Administered 2023-08-29: 150 mg via INTRAVENOUS

## 2023-08-29 MED ORDER — HYDROMORPHONE HCL 1 MG/ML IJ SOLN
INTRAMUSCULAR | Status: AC
  Filled 2023-08-29: qty 0.5

## 2023-08-29 MED ORDER — GABAPENTIN 300 MG PO CAPS: 300.0000 mg | ORAL_CAPSULE | ORAL | Status: AC

## 2023-08-29 MED ORDER — THROMBIN 5000 UNITS EX SOLR
OROMUCOSAL | Status: DC | PRN
  Administered 2023-08-29: 5 mL

## 2023-08-29 MED ORDER — ONDANSETRON HCL 4 MG/2ML IJ SOLN
INTRAMUSCULAR | Status: DC | PRN
  Administered 2023-08-29: 4 mg via INTRAVENOUS

## 2023-08-29 MED ORDER — PHENOL 1.4 % MT LIQD: 1.0000 | OROMUCOSAL | Status: DC | PRN

## 2023-08-29 MED ORDER — CHLORHEXIDINE GLUCONATE CLOTH 2 % EX PADS: 6.0000 | MEDICATED_PAD | Freq: Once | CUTANEOUS | Status: DC

## 2023-08-29 MED ORDER — MIDAZOLAM HCL 2 MG/2ML IJ SOLN
INTRAMUSCULAR | Status: AC
  Filled 2023-08-29: qty 2

## 2023-08-29 MED ORDER — LACTATED RINGERS IV SOLN: INTRAVENOUS | Status: DC

## 2023-08-29 MED ORDER — SODIUM CHLORIDE 0.9 % IV SOLN
250.0000 mL | INTRAVENOUS | Status: DC
Start: 2023-08-29 — End: 2023-08-29
  Administered 2023-08-29: 250 mL via INTRAVENOUS

## 2023-08-29 MED ORDER — OXYCODONE HCL 5 MG PO TABS: 5.0000 mg | ORAL_TABLET | Freq: Once | ORAL | Status: DC | PRN

## 2023-08-29 MED ORDER — MENTHOL 3 MG MT LOZG: 1.0000 | LOZENGE | OROMUCOSAL | Status: DC | PRN

## 2023-08-29 MED ORDER — CEFAZOLIN SODIUM-DEXTROSE 2-4 GM/100ML-% IV SOLN
INTRAVENOUS | Status: AC
  Filled 2023-08-29: qty 100

## 2023-08-29 MED ORDER — 0.9 % SODIUM CHLORIDE (POUR BTL) OPTIME
TOPICAL | Status: DC | PRN
  Administered 2023-08-29: 1000 mL

## 2023-08-29 MED ORDER — ACETAMINOPHEN 500 MG PO TABS
1000.0000 mg | ORAL_TABLET | Freq: Four times a day (QID) | ORAL | Status: DC
  Administered 2023-08-29: 1000 mg via ORAL
  Filled 2023-08-29: qty 2

## 2023-08-29 MED ORDER — CEFAZOLIN SODIUM-DEXTROSE 2-4 GM/100ML-% IV SOLN
2.0000 g | Freq: Three times a day (TID) | INTRAVENOUS | Status: DC
  Administered 2023-08-29: 2 g via INTRAVENOUS
  Filled 2023-08-29: qty 100

## 2023-08-29 MED ORDER — SCOPOLAMINE 1 MG/3DAYS TD PT72
1.0000 | MEDICATED_PATCH | Freq: Once | TRANSDERMAL | Status: DC
  Administered 2023-08-29: 1.5 mg via TRANSDERMAL
  Filled 2023-08-29: qty 1

## 2023-08-29 MED ORDER — GABAPENTIN 300 MG PO CAPS
ORAL_CAPSULE | ORAL | Status: AC
  Administered 2023-08-29: 300 mg via ORAL
  Filled 2023-08-29: qty 1

## 2023-08-29 MED ORDER — SENNA 8.6 MG PO TABS
1.0000 | ORAL_TABLET | Freq: Two times a day (BID) | ORAL | Status: DC
Start: 2023-08-29 — End: 2023-08-29

## 2023-08-29 MED ORDER — SODIUM CHLORIDE 0.9% FLUSH
3.0000 mL | Freq: Two times a day (BID) | INTRAVENOUS | Status: DC
  Administered 2023-08-29: 10 mL via INTRAVENOUS

## 2023-08-29 MED ORDER — HYDROMORPHONE HCL 1 MG/ML IJ SOLN
INTRAMUSCULAR | Status: DC | PRN
  Administered 2023-08-29: .5 mg via INTRAVENOUS

## 2023-08-29 MED ORDER — MIDAZOLAM HCL 2 MG/2ML IJ SOLN: 0.5000 mg | Freq: Once | INTRAMUSCULAR | Status: DC | PRN

## 2023-08-29 MED ORDER — CYCLOBENZAPRINE HCL 5 MG PO TABS: ORAL_TABLET | ORAL | 0 refills | Status: AC

## 2023-08-29 MED ORDER — HYDROMORPHONE HCL 1 MG/ML IJ SOLN: 0.5000 mg | INTRAMUSCULAR | Status: DC | PRN

## 2023-08-29 MED ORDER — LIDOCAINE 2% (20 MG/ML) 5 ML SYRINGE
INTRAMUSCULAR | Status: DC | PRN
  Administered 2023-08-29: 2.5 mL via INTRAVENOUS

## 2023-08-29 MED ORDER — ACETAMINOPHEN 500 MG PO TABS
ORAL_TABLET | ORAL | Status: AC
  Filled 2023-08-29: qty 2

## 2023-08-29 MED ORDER — MEPERIDINE HCL 25 MG/ML IJ SOLN: 6.2500 mg | INTRAMUSCULAR | Status: DC | PRN

## 2023-08-29 MED ORDER — ROCURONIUM BROMIDE 10 MG/ML (PF) SYRINGE
PREFILLED_SYRINGE | INTRAVENOUS | Status: DC | PRN
  Administered 2023-08-29: 100 mg via INTRAVENOUS

## 2023-08-29 MED ORDER — BUPIVACAINE HCL (PF) 0.25 % IJ SOLN
INTRAMUSCULAR | Status: DC | PRN
  Administered 2023-08-29: 10 mL

## 2023-08-29 MED ORDER — BUPIVACAINE HCL (PF) 0.25 % IJ SOLN
INTRAMUSCULAR | Status: AC
  Filled 2023-08-29: qty 30

## 2023-08-29 MED ORDER — ZINC 50 MG PO TABS
50.0000 mg | ORAL_TABLET | Freq: Every day | ORAL | Status: DC
  Filled 2023-08-29: qty 1

## 2023-08-29 MED ORDER — CHLORHEXIDINE GLUCONATE 0.12 % MT SOLN
15.0000 mL | Freq: Once | OROMUCOSAL | Status: AC
Start: 2023-08-29 — End: 2023-08-29

## 2023-08-29 MED ORDER — HYDROMORPHONE HCL 1 MG/ML IJ SOLN
0.2500 mg | INTRAMUSCULAR | Status: DC | PRN
  Administered 2023-08-29 (×3): 0.5 mg via INTRAVENOUS

## 2023-08-29 MED ORDER — CHLORHEXIDINE GLUCONATE 0.12 % MT SOLN
OROMUCOSAL | Status: AC
  Administered 2023-08-29: 15 mL via OROMUCOSAL
  Filled 2023-08-29: qty 15

## 2023-08-29 MED ORDER — THROMBIN 5000 UNITS EX SOLR
CUTANEOUS | Status: AC
  Filled 2023-08-29: qty 5000

## 2023-08-29 MED ORDER — OXYCODONE HCL 5 MG PO TABS
5.0000 mg | ORAL_TABLET | ORAL | Status: DC | PRN
  Administered 2023-08-29: 5 mg via ORAL
  Filled 2023-08-29: qty 1

## 2023-08-29 MED ORDER — VASHE WOUND IRRIGATION OPTIME
TOPICAL | Status: DC | PRN
  Administered 2023-08-29: 34 [oz_av]

## 2023-08-29 MED ORDER — ACETAMINOPHEN 500 MG PO TABS: 1000.0000 mg | ORAL_TABLET | Freq: Once | ORAL | Status: DC

## 2023-08-29 MED ORDER — CYCLOBENZAPRINE HCL 5 MG PO TABS: ORAL_TABLET | ORAL | 0 refills | Status: DC

## 2023-08-29 MED ORDER — OXYCODONE HCL 5 MG/5ML PO SOLN: 5.0000 mg | Freq: Once | ORAL | Status: DC | PRN

## 2023-08-29 MED ORDER — SODIUM CHLORIDE 0.9% FLUSH
3.0000 mL | Freq: Two times a day (BID) | INTRAVENOUS | Status: DC
Start: 2023-08-29 — End: 2023-08-29

## 2023-08-29 SURGICAL SUPPLY — 56 items
ALLOGRAFT BONE FIBER KORE 5 (Bone Implant) IMPLANT
BAG COUNTER SPONGE SURGICOUNT (BAG) ×1 IMPLANT
BASKET BONE COLLECTION (BASKET) ×1 IMPLANT
BENZOIN TINCTURE PRP APPL 2/3 (GAUZE/BANDAGES/DRESSINGS) ×1 IMPLANT
BLADE BONE MILL MEDIUM (MISCELLANEOUS) ×1 IMPLANT
BLADE CLIPPER SURG (BLADE) IMPLANT
BUR CARBIDE MATCH 3.0 (BURR) ×1 IMPLANT
CANISTER SUCT 3000ML PPV (MISCELLANEOUS) ×1 IMPLANT
CNTNR URN SCR LID CUP LEK RST (MISCELLANEOUS) ×1 IMPLANT
COVER BACK TABLE 60X90IN (DRAPES) ×1 IMPLANT
DERMABOND ADVANCED .7 DNX12 (GAUZE/BANDAGES/DRESSINGS) ×1 IMPLANT
DERMABOND ADVANCED .7 DNX6 (GAUZE/BANDAGES/DRESSINGS) IMPLANT
DRAPE C-ARM 42X72 X-RAY (DRAPES) ×2 IMPLANT
DRAPE C-ARMOR (DRAPES) ×1 IMPLANT
DRAPE LAPAROTOMY 100X72X124 (DRAPES) ×1 IMPLANT
DRAPE SURG 17X23 STRL (DRAPES) ×1 IMPLANT
DURAPREP 26ML APPLICATOR (WOUND CARE) ×1 IMPLANT
ELECT REM PT RETURN 9FT ADLT (ELECTROSURGICAL) ×1 IMPLANT
ELECTRODE REM PT RTRN 9FT ADLT (ELECTROSURGICAL) ×1 IMPLANT
EVACUATOR 1/8 PVC DRAIN (DRAIN) ×1 IMPLANT
GAUZE 4X4 16PLY ~~LOC~~+RFID DBL (SPONGE) IMPLANT
GAUZE SPONGE 4X4 12PLY STRL (GAUZE/BANDAGES/DRESSINGS) IMPLANT
GLOVE BIO SURGEON STRL SZ7 (GLOVE) IMPLANT
GLOVE BIO SURGEON STRL SZ8 (GLOVE) ×2 IMPLANT
GLOVE BIOGEL PI IND STRL 7.0 (GLOVE) IMPLANT
GOWN STRL REUS W/ TWL LRG LVL3 (GOWN DISPOSABLE) IMPLANT
GOWN STRL REUS W/ TWL XL LVL3 (GOWN DISPOSABLE) ×2 IMPLANT
GOWN STRL REUS W/TWL 2XL LVL3 (GOWN DISPOSABLE) IMPLANT
HEMOSTAT POWDER KIT SURGIFOAM (HEMOSTASIS) ×1 IMPLANT
KIT BASIN OR (CUSTOM PROCEDURE TRAY) ×1 IMPLANT
KIT TURNOVER KIT B (KITS) ×1 IMPLANT
MILL BONE PREP (MISCELLANEOUS) ×1 IMPLANT
NDL HYPO 25X1 1.5 SAFETY (NEEDLE) ×1 IMPLANT
NEEDLE HYPO 25X1 1.5 SAFETY (NEEDLE) ×1 IMPLANT
NS IRRIG 1000ML POUR BTL (IV SOLUTION) ×1 IMPLANT
PACK LAMINECTOMY NEURO (CUSTOM PROCEDURE TRAY) ×1 IMPLANT
PAD ARMBOARD 7.5X6 YLW CONV (MISCELLANEOUS) ×3 IMPLANT
POWDER MYRIAD MORCLLS FINE 500 (Miscellaneous) IMPLANT
PWDR MYRIAD MORCELLS FINE 500 (Miscellaneous) ×1 IMPLANT
ROD LORD LIPPED TI 5.5X35 (Rod) IMPLANT
SCREW CORT SHANK MOD 6.5X40 (Screw) IMPLANT
SCREW KODIAK 6.5X40 (Screw) IMPLANT
SCREW POLYAXIAL TULIP (Screw) IMPLANT
SET SCREW SPNE (Screw) IMPLANT
SOLUTION IRRIG SURGIPHOR (IV SOLUTION) ×1 IMPLANT
SPACER IDENTITI PS 5D 8X9X25 (Spacer) IMPLANT
SPONGE SURGIFOAM ABS GEL 100 (HEMOSTASIS) ×1 IMPLANT
SPONGE T-LAP 4X18 ~~LOC~~+RFID (SPONGE) IMPLANT
STRIP CLOSURE SKIN 1/2X4 (GAUZE/BANDAGES/DRESSINGS) ×1 IMPLANT
SUT VIC AB 0 CT1 18XCR BRD8 (SUTURE) ×1 IMPLANT
SUT VIC AB 2-0 CP2 18 (SUTURE) ×1 IMPLANT
SUT VIC AB 3-0 SH 8-18 (SUTURE) ×2 IMPLANT
TAPE PAPER 3X10 WHT MICROPORE (GAUZE/BANDAGES/DRESSINGS) IMPLANT
TOWEL GREEN STERILE (TOWEL DISPOSABLE) ×1 IMPLANT
TOWEL GREEN STERILE FF (TOWEL DISPOSABLE) ×1 IMPLANT
WATER STERILE IRR 1000ML POUR (IV SOLUTION) ×1 IMPLANT

## 2023-08-29 NOTE — Anesthesia Procedure Notes (Signed)
Procedure Name: Intubation Date/Time: 08/29/2023 10:57 AM  Performed by: Hali Marry, CRNAPre-anesthesia Checklist: Patient identified, Emergency Drugs available, Suction available and Patient being monitored Patient Re-evaluated:Patient Re-evaluated prior to induction Oxygen Delivery Method: Circle system utilized Preoxygenation: Pre-oxygenation with 100% oxygen Induction Type: IV induction Ventilation: Mask ventilation without difficulty Laryngoscope Size: Mac and 3 Grade View: Grade I Tube type: Oral Tube size: 7.0 mm Number of attempts: 1 Airway Equipment and Method: Stylet and Oral airway Placement Confirmation: ETT inserted through vocal cords under direct vision, positive ETCO2 and breath sounds checked- equal and bilateral Secured at: 22 cm Tube secured with: Tape Dental Injury: Teeth and Oropharynx as per pre-operative assessment

## 2023-08-29 NOTE — Op Note (Signed)
08/29/2023  1:15 PM  PATIENT:  Dawn Mcguire  58 y.o. female  PRE-OPERATIVE DIAGNOSIS: Dynamic spondylolisthesis L4-5, spinal stenosis L4-5, back pain with right radiculopathy  POST-OPERATIVE DIAGNOSIS:  same  PROCEDURE:   1. Decompressive lumbar laminectomy, hemi facetectomy and foraminotomies L4-5 requiring more work than would be required for a simple exposure of the disk for PLIF in order to adequately decompress the neural elements and address the spinal stenosis 2. Posterior lumbar interbody fusion L4-5 using PTI interbody cages packed with morcellized allograft and autograft  3. Posterior fixation L4-5 using ATEC cortical pedicle screws.  4. Intertransverse arthrodesis L4-5 using morcellized autograft and allograft.  SURGEON:  Marikay Alar, MD  ASSISTANTSAdelene Idler FNP  ANESTHESIA:  General  EBL: 150 ml  Total I/O In: -  Out: 150 [Blood:150]  BLOOD ADMINISTERED:none  DRAINS: none   INDICATION FOR PROCEDURE: This patient presented with back and R leg pain. Imaging revealed dynamic spondylolisthesis L4-5 with stenosis. The patient tried a reasonable attempt at conservative medical measures without relief. I recommended decompression and instrumented fusion to address the stenosis as well as the segmental  instability.  Patient understood the risks, benefits, and alternatives and potential outcomes and wished to proceed.  PROCEDURE DETAILS:  The patient was brought to the operating room. After induction of generalized endotracheal anesthesia the patient was rolled into the prone position on chest rolls and all pressure points were padded. The patient's lumbar region was cleaned and then prepped with DuraPrep and draped in the usual sterile fashion. Anesthesia was injected and then a dorsal midline incision was made and carried down to the lumbosacral fascia. The fascia was opened and the paraspinous musculature was taken down in a subperiosteal fashion to expose L4-5. A  self-retaining retractor was placed. Intraoperative fluoroscopy confirmed my level, and I started with placement of the L4 cortical pedicle screws. The pedicle screw entry zones were identified utilizing surface landmarks and  AP and lateral fluoroscopy. I scored the cortex with the high-speed drill and then used the hand drill to drill an upward and outward direction into the pedicle. I then tapped line to line. I then placed a 6.5 x 40 mm cortical pedicle screw into the pedicles of L4 bilaterally.    I then turned my attention to the decompression and complete lumbar laminectomies, hemi- facetectomies, and foraminotomies were performed at L4-5.  My nurse practitioner was directly involved in the decompression and exposure of the neural elements. the patient had significant spinal stenosis and this required more work than would be required for a simple exposure of the disc for posterior lumbar interbody fusion which would only require a limited laminotomy. Much more generous decompression and generous foraminotomy was undertaken in order to adequately decompress the neural elements and address the patient's leg pain. The yellow ligament was removed to expose the underlying dura and nerve roots, and generous foraminotomies were performed to adequately decompress the neural elements. Both the exiting and traversing nerve roots were decompressed on both sides until a coronary dilator passed easily along the nerve roots. Once the decompression was complete, I turned my attention to the posterior lower lumbar interbody fusion. The epidural venous vasculature was coagulated and cut sharply. Disc space was incised and the initial discectomy was performed with pituitary rongeurs. The disc space was distracted with sequential distractors to a height of 8 mm. We then used a series of scrapers and shavers to prepare the endplates for fusion. The midline was prepared with Epstein curettes. Once the  complete discectomy was  finished, we packed an appropriate sized interbody cage with local autograft and morcellized allograft, gently retracted the nerve root, and tapped the cage into position at L4-5.  The midline between the cages was packed with morselized autograft and allograft.   We then turned our attention to the placement of the lower pedicle screws. The pedicle screw entry zones were identified utilizing surface landmarks and fluoroscopy. I drilled into each pedicle utilizing the hand drill, and tapped each pedicle with the appropriate tap. We palpated with a ball probe to assure no break in the cortex. We then placed 6.5 x 40 mm pedicle screws into the pedicles bilaterally at L5.  My nurse practitioner assisted in placement of the pedicle screws.  We then decorticated the transverse processes and laid a mixture of morcellized autograft and allograft out over these to perform intertransverse arthrodesis at L4-5. We then placed lordotic rods into the multiaxial screw heads of the pedicle screws and locked these in position with the locking caps and anti-torque device. We then checked our construct with AP and lateral fluoroscopy. Irrigated with copious amounts of Vashe solution followed by saline solution. Inspected the nerve roots once again to assure adequate decompression, lined to the dura with Gelfoam,  and then we closed the muscle and the fascia with 0 Vicryl.  We placed 500 g of myriad fine morsels into the 60 mm wound and closed the subcutaneous tissues with 2-0 Vicryl and subcuticular tissues with 3-0 Vicryl. The skin was closed with benzoin and Steri-Strips. Dressing was then applied, the patient was awakened from general anesthesia and transported to the recovery room in stable condition. At the end of the procedure all sponge, needle and instrument counts were correct.   PLAN OF CARE: admit to inpatient  PATIENT DISPOSITION:  PACU - hemodynamically stable.   Delay start of Pharmacological VTE agent (>24hrs)  due to surgical blood loss or risk of bleeding:  yes

## 2023-08-29 NOTE — Plan of Care (Signed)
Problem: Education: Goal: Knowledge of General Education information will improve Description: Including pain rating scale, medication(s)/side effects and non-pharmacologic comfort measures 08/29/2023 1807 by Vincente Liberty, RN Outcome: Completed/Met 08/29/2023 1724 by Vincente Liberty, RN Outcome: Progressing   Problem: Health Behavior/Discharge Planning: Goal: Ability to manage health-related needs will improve 08/29/2023 1807 by Vincente Liberty, RN Outcome: Completed/Met 08/29/2023 1724 by Vincente Liberty, RN Outcome: Progressing   Problem: Clinical Measurements: Goal: Ability to maintain clinical measurements within normal limits will improve 08/29/2023 1807 by Vincente Liberty, RN Outcome: Completed/Met 08/29/2023 1724 by Vincente Liberty, RN Outcome: Progressing Goal: Will remain free from infection 08/29/2023 1807 by Vincente Liberty, RN Outcome: Completed/Met 08/29/2023 1724 by Vincente Liberty, RN Outcome: Progressing Goal: Diagnostic test results will improve 08/29/2023 1807 by Vincente Liberty, RN Outcome: Completed/Met 08/29/2023 1724 by Vincente Liberty, RN Outcome: Progressing Goal: Respiratory complications will improve 08/29/2023 1807 by Vincente Liberty, RN Outcome: Completed/Met 08/29/2023 1724 by Vincente Liberty, RN Outcome: Progressing Goal: Cardiovascular complication will be avoided 08/29/2023 1807 by Vincente Liberty, RN Outcome: Completed/Met 08/29/2023 1724 by Vincente Liberty, RN Outcome: Progressing   Problem: Activity: Goal: Risk for activity intolerance will decrease 08/29/2023 1807 by Vincente Liberty, RN Outcome: Completed/Met 08/29/2023 1724 by Vincente Liberty, RN Outcome: Progressing   Problem: Nutrition: Goal: Adequate nutrition will be maintained 08/29/2023 1807 by Vincente Liberty, RN Outcome: Completed/Met 08/29/2023 1724 by Vincente Liberty, RN Outcome: Progressing   Problem: Coping: Goal: Level of anxiety will decrease 08/29/2023  1807 by Vincente Liberty, RN Outcome: Completed/Met 08/29/2023 1724 by Vincente Liberty, RN Outcome: Progressing   Problem: Elimination: Goal: Will not experience complications related to bowel motility 08/29/2023 1807 by Vincente Liberty, RN Outcome: Completed/Met 08/29/2023 1724 by Vincente Liberty, RN Outcome: Progressing Goal: Will not experience complications related to urinary retention 08/29/2023 1807 by Vincente Liberty, RN Outcome: Completed/Met 08/29/2023 1724 by Vincente Liberty, RN Outcome: Progressing   Problem: Pain Managment: Goal: General experience of comfort will improve and/or be controlled 08/29/2023 1807 by Vincente Liberty, RN Outcome: Completed/Met 08/29/2023 1724 by Vincente Liberty, RN Outcome: Progressing   Problem: Safety: Goal: Ability to remain free from injury will improve 08/29/2023 1807 by Vincente Liberty, RN Outcome: Completed/Met 08/29/2023 1724 by Vincente Liberty, RN Outcome: Progressing   Problem: Skin Integrity: Goal: Risk for impaired skin integrity will decrease 08/29/2023 1807 by Vincente Liberty, RN Outcome: Completed/Met 08/29/2023 1724 by Vincente Liberty, RN Outcome: Progressing   Problem: Education: Goal: Ability to verbalize activity precautions or restrictions will improve 08/29/2023 1807 by Vincente Liberty, RN Outcome: Completed/Met 08/29/2023 1724 by Vincente Liberty, RN Outcome: Progressing Goal: Knowledge of the prescribed therapeutic regimen will improve 08/29/2023 1807 by Vincente Liberty, RN Outcome: Completed/Met 08/29/2023 1724 by Vincente Liberty, RN Outcome: Progressing Goal: Understanding of discharge needs will improve 08/29/2023 1807 by Vincente Liberty, RN Outcome: Completed/Met 08/29/2023 1724 by Vincente Liberty, RN Outcome: Progressing   Problem: Activity: Goal: Ability to avoid complications of mobility impairment will improve 08/29/2023 1807 by Vincente Liberty, RN Outcome: Completed/Met 08/29/2023 1724 by  Vincente Liberty, RN Outcome: Progressing Goal: Ability to tolerate increased activity will improve 08/29/2023 1807 by Vincente Liberty, RN Outcome: Completed/Met 08/29/2023 1724 by Vincente Liberty, RN Outcome: Progressing Goal: Will remain free from falls 08/29/2023 1807 by Vincente Liberty, RN Outcome: Completed/Met 08/29/2023 1724 by Vincente Liberty, RN Outcome: Progressing   Problem: Bowel/Gastric: Goal: Gastrointestinal status for postoperative course will improve 08/29/2023 1807 by Vincente Liberty, RN Outcome: Completed/Met 08/29/2023 1724 by Vincente Liberty, RN Outcome: Progressing   Problem: Clinical Measurements: Goal: Ability to  maintain clinical measurements within normal limits will improve 08/29/2023 1807 by Vincente Liberty, RN Outcome: Completed/Met 08/29/2023 1724 by Vincente Liberty, RN Outcome: Progressing Goal: Postoperative complications will be avoided or minimized 08/29/2023 1807 by Vincente Liberty, RN Outcome: Completed/Met 08/29/2023 1724 by Vincente Liberty, RN Outcome: Progressing Goal: Diagnostic test results will improve 08/29/2023 1807 by Vincente Liberty, RN Outcome: Completed/Met 08/29/2023 1724 by Vincente Liberty, RN Outcome: Progressing   Problem: Pain Management: Goal: Pain level will decrease 08/29/2023 1807 by Vincente Liberty, RN Outcome: Completed/Met 08/29/2023 1724 by Vincente Liberty, RN Outcome: Progressing   Problem: Skin Integrity: Goal: Will show signs of wound healing 08/29/2023 1807 by Vincente Liberty, RN Outcome: Completed/Met 08/29/2023 1724 by Vincente Liberty, RN Outcome: Progressing   Problem: Health Behavior/Discharge Planning: Goal: Identification of resources available to assist in meeting health care needs will improve 08/29/2023 1807 by Vincente Liberty, RN Outcome: Completed/Met 08/29/2023 1724 by Vincente Liberty, RN Outcome: Progressing   Problem: Bladder/Genitourinary: Goal: Urinary functional status for  postoperative course will improve 08/29/2023 1807 by Vincente Liberty, RN Outcome: Completed/Met 08/29/2023 1724 by Vincente Liberty, RN Outcome: Progressing

## 2023-08-29 NOTE — Progress Notes (Signed)
Patient alert and oriented, mae's well, voiding adequate amount of urine, swallowing without difficulty, no c/o pain at time of discharge. Patient discharged home with family. Script and discharged instructions given to patient. Patient and family stated understanding of instructions given. Patient has an appointment with Dr. Lineback °

## 2023-08-29 NOTE — Plan of Care (Signed)

## 2023-08-29 NOTE — Transfer of Care (Signed)
Immediate Anesthesia Transfer of Care Note  Patient: Spring San  Procedure(s) Performed: PLIF - L4-L5 - Posterior Lateral and Interbody fusion (Back)  Patient Location: PACU  Anesthesia Type:General  Level of Consciousness: awake, alert , and oriented  Airway & Oxygen Therapy: Patient Spontanous Breathing and Patient connected to nasal cannula oxygen  Post-op Assessment: Report given to RN and Post -op Vital signs reviewed and stable  Post vital signs: Reviewed and stable  Last Vitals:  Vitals Value Taken Time  BP 154/85 08/29/23 1330  Temp 36.6 C 08/29/23 1329  Pulse 82 08/29/23 1338  Resp 12 08/29/23 1338  SpO2 96 % 08/29/23 1338  Vitals shown include unfiled device data.  Last Pain:  Vitals:   08/29/23 1329  PainSc: 10-Worst pain ever      Patients Stated Pain Goal: 4 (08/29/23 4034)  Complications: No notable events documented.

## 2023-08-29 NOTE — H&P (Signed)
Subjective: Patient is a 58 y.o. female admitted for back and leg pain. Onset of symptoms was several months ago, gradually worsening since that time.  The pain is rated severe, and is located at the across the lower back and radiates to RLE. The pain is described as aching and occurs all day. The symptoms have been progressive. Symptoms are exacerbated by exercise, standing, and walking for more than a few minutes. MRI or CT showed spondylolisthesis with stenosis L4-5   Past Medical History:  Diagnosis Date   Arthritis    Carpal tunnel syndrome of right wrist    Closed displaced trimalleolar fracture of right ankle 11/20/2018   GERD (gastroesophageal reflux disease)    Psoriasis     Past Surgical History:  Procedure Laterality Date   ABDOMINAL HYSTERECTOMY     BLADDER REPAIR     BREAST SURGERY     CARPAL TUNNEL RELEASE     on the right wrist   CHOLECYSTECTOMY     INSERTION OF MESH N/A 10/16/2017   Procedure: INSERTION OF MESH;  Surgeon: Manus Rudd, MD;  Location: [facility] OR;  Service: General;  Laterality: N/A;   IR US GUIDE BX ASP/DRAIN  12/31/2017   ORIF ANKLE FRACTURE Right 11/20/2018   Procedure: OPEN REDUCTION INTERNAL FIXATION (ORIF) ANKLE FRACTURE AND SYNDESMOSIS;  Surgeon: Teryl Lucy, MD;  Location: [facility];  Service: Orthopedics;  Laterality: Right;   VENTRAL HERNIA REPAIR N/A 10/16/2017   Procedure: OPEN VENTRAL HERNIA REPAIR WITH MESH;  Surgeon: Manus Rudd, MD;  Location: [facility] OR;  Service: General;  Laterality: N/A;    Prior to Admission medications   Medication Sig Start Date End Date Taking? Authorizing Provider  acetaminophen (TYLENOL) 500 MG tablet Take 1,000 mg by mouth 2 (two) times daily.   Yes [provider]  Ascorbic Acid (VITAMIN C) 1000 MG tablet Take 1,000 mg by mouth daily.   Yes [provider]  Biotin 1 MG CAPS Take 1 mg by mouth daily.   Yes [provider]  Cyanocobalamin (B-12 PO) Take 1 capsule by mouth  daily.   Yes [provider]  cyclobenzaprine (FLEXERIL) 5 MG tablet Use 1 to 2 tablets (5 to 10 mg) nightly as needed for muscle spasms. 07/31/23  Yes Richardean Sale, DO  loratadine-pseudoephedrine (CLARITIN-D 12-HOUR) 5-120 MG tablet Take 1 tablet by mouth daily.   Yes [provider]  meloxicam (MOBIC) 15 MG tablet Take 1 tablet (15 mg total) by mouth daily. 07/31/23  Yes Richardean Sale, DO  omeprazole (PRILOSEC OTC) 20 MG tablet Take 20 mg by mouth daily.   Yes [provider]  pseudoephedrine (SUDAFED) 30 MG tablet Take 30 mg by mouth every 6 (six) hours as needed for congestion.   Yes [provider]  Turmeric (QC TUMERIC COMPLEX PO) Take 1 tablet by mouth daily.   Yes [provider]  VITAMIN D PO Take 1 capsule by mouth daily.   Yes [provider]  Zinc 50 MG TABS Take 50 mg by mouth daily.   Yes [provider]   Allergies  Allergen Reactions   Latex Rash    Tears skin and break out   Sulfa Antibiotics Itching and Nausea And Vomiting    Skin becomes red and feels like it is burning   Sulfasalazine Hives, Itching and Nausea And Vomiting    Skin becomes red and feels like it is burning    Tape     Tears skin. Patient prefers  paper tape    Corticosteroids Hives, Itching and Rash    fever   Nitrofurantoin Monohyd Macro Nausea And Vomiting   Penicillins Hives, Itching and Rash          Social History   Tobacco Use   Smoking status: Never   Smokeless tobacco: Never  Substance Use Topics   Alcohol use: No    Family History  Problem Relation Age of Onset   Arthritis Mother    Heart attack Mother    High blood pressure Mother    Cancer Father    Diabetes Father    Arthritis Brother    Arthritis Brother    Arthritis Brother    Diabetes Son      Review of Systems  Positive ROS: neg  All other systems have been reviewed and were otherwise negative with the exception of those mentioned in the HPI and  as above.  Objective: Vital signs in last 24 hours: Temp:  [98.1 F (36.7 C)] 98.1 F (36.7 C) (01/31 0850) Pulse Rate:  [87] 87 (01/31 0850) Resp:  [18] 18 (01/31 0850) BP: (139)/(94) 139/94 (01/31 0850) SpO2:  [99 %] 99 % (01/31 0850) Weight:  [103.4 kg] 103.4 kg (01/31 0850)  General Appearance: Alert, cooperative, no distress, appears stated age Head: Normocephalic, without obvious abnormality, atraumatic Eyes: PERRL, conjunctiva/corneas clear, EOM's intact    Neck: Supple, symmetrical, trachea midline Back: Symmetric, no curvature, ROM normal, no CVA tenderness Lungs:  respirations unlabored Heart: Regular rate and rhythm Abdomen: Soft, non-tender Extremities: Extremities normal, atraumatic, no cyanosis or edema Pulses: 2+ and symmetric all extremities Skin: Skin color, texture, turgor normal, no rashes or lesions  NEUROLOGIC:   Mental status: Alert and oriented x4,  no aphasia, good attention span, fund of knowledge, and memory Motor Exam - grossly normal Sensory Exam - grossly normal Reflexes: 1+ Coordination - grossly normal Gait - grossly normal Balance - grossly normal Cranial Nerves: I: smell Not tested  II: visual acuity  OS: nl    OD: nl  II: visual fields Full to confrontation  II: pupils Equal, round, reactive to light  III,VII: ptosis None  III,IV,VI: extraocular muscles  Full ROM  V: mastication Normal  V: facial light touch sensation  Normal  V,VII: corneal reflex  Present  VII: facial muscle function - upper  Normal  VII: facial muscle function - lower Normal  VIII: hearing Not tested  IX: soft palate elevation  Normal  IX,X: gag reflex Present  XI: trapezius strength  5/5  XI: sternocleidomastoid strength 5/5  XI: neck flexion strength  5/5  XII: tongue strength  Normal    Data Review Lab Results  Component Value Date   WBC 10.6 (H) 08/26/2023   HGB 15.1 (H) 08/26/2023   HCT 45.4 08/26/2023   MCV 92.5 08/26/2023   PLT 306 08/26/2023    Lab Results  Component Value Date   NA 138 08/26/2023   K 4.1 08/26/2023   CL 103 08/26/2023   CO2 25 08/26/2023   BUN 14 08/26/2023   CREATININE 0.71 08/26/2023   GLUCOSE 100 (H) 08/26/2023   Lab Results  Component Value Date   INR 0.9 08/26/2023    Assessment/Plan:  Estimated body mass index is 34.67 kg/m as calculated from the following:   Height as of this encounter: 5\' 8"  (1.727 m).   Weight as of this encounter: 103.4 kg. Patient admitted for PLIF L4-5. Patient has failed a reasonable attempt at conservative therapy.  I  explained the condition and procedure to the patient and answered any questions.  Patient wishes to proceed with procedure as planned. Understands risks/ benefits and typical outcomes of procedure.   Tia Alert 08/29/2023 10:22 AM

## 2023-08-29 NOTE — Evaluation (Signed)
Occupational Therapy Evaluation Patient Details Name: Dawn Mcguire MRN: [id number] DOB: 13-Aug-1965 Today's Date: 08/29/2023   History of Present Illness 58 yo F s/p PLIF.  PMH includes:  Arthritis, Carpal tunnel syndrome of right wrist, Closed displaced trimalleolar fracture of right ankle  11/20/2018, GERD, Psoriasis.   Clinical Impression   Patient admitted for the diagnosis/procedure above.  PTA she lives at home with her spouse, who can assist as needed.  Patient describes an acceptable amount of discomfort post procedure, and is able to walk independently to the bathroom, perform sit to stand ADL without assist, walk the hall with Mod I, and climb 6 stairs with use of handrail only.  Patient with good understanding of all back precautions, all questions answered, and no further OT needs in the acute setting.  No post acute rehab anticipated, recommend follow up as prescribed by MD.       If plan is discharge home, recommend the following: Assist for transportation;Assistance with cooking/housework    Functional Status Assessment  Patient has had a recent decline in their functional status and demonstrates the ability to make significant improvements in function in a reasonable and predictable amount of time.  Equipment Recommendations  None recommended by OT    Recommendations for Other Services       Precautions / Restrictions Precautions Precautions: Back Precaution Booklet Issued: Yes (comment) Required Braces or Orthoses: Spinal Brace Spinal Brace: Lumbar corset Restrictions Weight Bearing Restrictions Per Provider Order: No      Mobility Bed Mobility Overal bed mobility: Modified Independent                  Transfers Overall transfer level: Modified independent Equipment used: None                      Balance Overall balance assessment: Mild deficits observed, not formally tested                                          ADL either performed or assessed with clinical judgement   ADL Overall ADL's : At baseline                                             Vision Patient Visual Report: No change from baseline       Perception Perception: Within Functional Limits       Praxis Praxis: WFL       Pertinent Vitals/Pain Pain Assessment Pain Assessment: Faces Faces Pain Scale: Hurts a little bit Pain Location: Incisional Pain Descriptors / Indicators: Aching Pain Intervention(s): Monitored during session     Extremity/Trunk Assessment Upper Extremity Assessment Upper Extremity Assessment: Overall WFL for tasks assessed   Lower Extremity Assessment Lower Extremity Assessment: Overall WFL for tasks assessed   Cervical / Trunk Assessment Cervical / Trunk Assessment: Back Surgery   Communication Communication Communication: No apparent difficulties   Cognition Arousal: Alert Behavior During Therapy: WFL for tasks assessed/performed Overall Cognitive Status: Within Functional Limits for tasks assessed                                       General Comments   VSS on  RA    Exercises     Shoulder Instructions      Home Living Family/patient expects to be discharged to:: Private residence Living Arrangements: Spouse/significant other Available Help at Discharge: Family;Available 24 hours/day Type of Home: House Home Access: Stairs to enter [employer] of Steps: 2 Entrance Stairs-Rails: None Home Layout: Two level;Able to live on main level with bedroom/bathroom Alternate Level Stairs-Number of Steps: 14 Alternate Level Stairs-Rails: Right Bathroom Shower/Tub: Producer, television/film/video: Standard Bathroom Accessibility: Yes How Accessible: Accessible via walker Home Equipment: None          Prior Functioning/Environment Prior Level of Function : Independent/Modified Independent;Driving                        OT  Problem List: Pain      OT Treatment/Interventions:      OT Goals(Current goals can be found in the care plan section) Acute Rehab OT Goals Patient Stated Goal: Return home OT Goal Formulation: With patient Time For Goal Achievement: 09/01/23 Potential to Achieve Goals: Good  OT Frequency:      Co-evaluation              AM-PAC OT "6 Clicks" Daily Activity     Outcome Measure Help from another person eating meals?: None Help from another person taking care of personal grooming?: None Help from another person toileting, which includes using toliet, bedpan, or urinal?: None Help from another person bathing (including washing, rinsing, drying)?: None Help from another person to put on and taking off regular upper body clothing?: None Help from another person to put on and taking off regular lower body clothing?: None 6 Click Score: 24   End of Session Equipment Utilized During Treatment: Back brace Nurse Communication: Mobility status  Activity Tolerance: Patient tolerated treatment well Patient left: in bed;with call bell/phone within reach;with family/visitor present  OT Visit Diagnosis: Unsteadiness on feet (R26.81)                Time: 1610-9604 OT Time Calculation (min): 22 min Charges:  OT General Charges $OT Visit: 1 Visit OT Evaluation $OT Eval Moderate Complexity: 1 Mod  08/29/2023  RP, OTR/L  Acute Rehabilitation Services  Office:  [phone]   Suzanna Obey 08/29/2023, 4:32 PM

## 2023-08-29 NOTE — Progress Notes (Signed)
PT Cancellation Note  Patient Details Name: Dawn Mcguire MRN: [id number] DOB: 04/16/1966   Cancelled Treatment:    Reason Eval/Treat Not Completed: PT screened, no needs identified, will sign off.  OT saw and ascertained that there were no PT needs.  Will sign off. 08/29/2023  Jacinto Halim., PT Acute Rehabilitation Services [phone]  (office)   Dawn Mcguire 08/29/2023, 4:50 PM

## 2023-08-29 NOTE — Discharge Summary (Cosign Needed Addendum)
Physician Discharge Summary  Patient ID: Dawn Mcguire MRN: [id number] DOB/AGE: Dec 08, 1965 58 y.o.  Admit date: 08/29/2023 Discharge date: 08/29/2023  Admission Diagnoses:  Dynamic spondylolisthesis L4-5, spinal stenosis L4-5, back pain with right radiculopathy    Discharge Diagnoses: same   Discharged Condition: good  Hospital Course: The patient was admitted on 08/29/2023 and taken to the operating room where the patient underwent PLIF L4-5. The patient tolerated the procedure well and was taken to the recovery room and then to the floor in stable condition. The hospital course was routine. There were no complications. The wound remained clean dry and intact. Pt had appropriate back soreness. No complaints of leg pain or new N/T/W. The patient remained afebrile with stable vital signs, and tolerated a regular diet. The patient continued to increase activities, and pain was well controlled with oral pain medications.   Consults: None  Significant Diagnostic Studies:  Results for orders placed or performed during the hospital encounter of 08/29/23  ABO/Rh   Collection Time: 08/29/23  9:20 AM  Result Value Ref Range   ABO/RH(D)      O POS Performed at [facility], [address], [place], [place] [address]     DG Lumbar Spine 2-3 Views Result Date: 08/29/2023 CLINICAL DATA:  Elective surgery. EXAM: LUMBAR SPINE - 2-3 VIEW COMPARISON:  Radiograph 02/20/2023 FINDINGS: Two fluoroscopic spot views of the lumbar spine obtained in the operating room. Posterior rod with pedicle screw fusion L4-L5 with interbody spacer. Fluoroscopy time 53.5 seconds. Dose 67.892 mGy IMPRESSION: Intraoperative fluoroscopy during lumbar fusion. Electronically Signed   By: Narda Rutherford M.D.   On: 08/29/2023 15:00   DG C-Arm 1-60 Min-No Report Result Date: 08/29/2023 Fluoroscopy was utilized by the requesting physician.  No radiographic interpretation.   DG C-Arm 1-60 Min-No Report Result Date:  08/29/2023 Fluoroscopy was utilized by the requesting physician.  No radiographic interpretation.    Antibiotics:  Anti-infectives (From admission, onward)    Start     Dose/Rate Route Frequency Ordered Stop   08/29/23 1700  ceFAZolin (ANCEF) IVPB 2g/100 mL premix        2 g 200 mL/hr over 30 Minutes Intravenous Every 8 hours 08/29/23 1433 08/30/23 0859   08/29/23 0900  ceFAZolin (ANCEF) IVPB 2g/100 mL premix        2 g 200 mL/hr over 30 Minutes Intravenous On call to O.R. 08/29/23 0846 08/29/23 1105   08/29/23 0852  ceFAZolin (ANCEF) 2-4 GM/100ML-% IVPB       Note to Pharmacy: Gleason, Ginger E: cabinet override      08/29/23 0852 08/29/23 1108       Discharge Exam: Blood pressure 139/83, pulse 68, temperature 98.5 F (36.9 C), resp. rate 20, height 5\' 8"  (1.727 m), weight 103.4 kg, SpO2 99%. Neurologic: Grossly normal Ambulating and voiding well incision cdi   Discharge Medications:   Allergies as of 08/29/2023       Reactions   Latex Rash   Tears skin and break out   Sulfa Antibiotics Itching, Nausea And Vomiting   Skin becomes red and feels like it is burning   Sulfasalazine Hives, Itching, Nausea And Vomiting   Skin becomes red and feels like it is burning   Tape    Tears skin. Patient prefers paper tape    Corticosteroids Hives, Itching, Rash   fever   Nitrofurantoin Monohyd Macro Nausea And Vomiting   Penicillins Hives, Itching, Rash  Medication List     STOP taking these medications    meloxicam 15 MG tablet Commonly known as: MOBIC       TAKE these medications    acetaminophen 500 MG tablet Commonly known as: TYLENOL Take 1,000 mg by mouth 2 (two) times daily.   B-12 PO Take 1 capsule by mouth daily.   Biotin 1 MG Caps Take 1 mg by mouth daily.   cyclobenzaprine 5 MG tablet Commonly known as: FLEXERIL Use 1 to 2 tablets (5 to 10 mg) nightly as needed for muscle spasms.   loratadine-pseudoephedrine 5-120 MG tablet Commonly  known as: CLARITIN-D 12-hour Take 1 tablet by mouth daily.   oxyCODONE-acetaminophen 5-325 MG tablet Commonly known as: Percocet Take 1 tablet by mouth every 4 (four) hours as needed for severe pain (pain score 7-10).   PriLOSEC OTC 20 MG tablet Generic drug: omeprazole Take 20 mg by mouth daily.   pseudoephedrine 30 MG tablet Commonly known as: SUDAFED Take 30 mg by mouth every 6 (six) hours as needed for congestion.   QC TUMERIC COMPLEX PO Take 1 tablet by mouth daily.   vitamin C 1000 MG tablet Take 1,000 mg by mouth daily.   VITAMIN D PO Take 1 capsule by mouth daily.   Zinc 50 MG Tabs Take 50 mg by mouth daily.               Durable Medical Equipment  (From admission, onward)           Start     Ordered   08/29/23 1434  DME Walker rolling  Once       Question:  Patient needs a walker to treat with the following condition  Answer:  S/P lumbar fusion   08/29/23 1433   08/29/23 1434  DME 3 n 1  Once        08/29/23 1433            Disposition: home   Final Dx: PLIF L4-5  Discharge Instructions      Remove dressing in 72 hours   Complete by: As directed    Call MD for:  difficulty breathing, headache or visual disturbances   Complete by: As directed    Call MD for:  hives   Complete by: As directed    Call MD for:  persistant nausea and vomiting   Complete by: As directed    Call MD for:  redness, tenderness, or signs of infection (pain, swelling, redness, odor or green/yellow discharge around incision site)   Complete by: As directed    Call MD for:  severe uncontrolled pain   Complete by: As directed    Call MD for:  temperature >100.4   Complete by: As directed    Diet - low sodium heart healthy   Complete by: As directed    Driving Restrictions   Complete by: As directed    No driving for 2 weeks, no riding in the car for 1 week   Increase activity slowly   Complete by: As directed    Lifting restrictions   Complete by: As  directed    No lifting more than 8 lbs        Follow-up Information     Arman Bogus, MD. Call.   Specialty: Neurosurgery Why: As needed, If symptoms worsen Contact information: [address] [place] [address] [phone]  Signed: Tiana Loft Mily Malecki 08/29/2023, 5:56 PM

## 2023-08-29 NOTE — Anesthesia Preprocedure Evaluation (Addendum)
Anesthesia Evaluation  Patient identified by MRN, date of birth, ID band Patient awake    Reviewed: Allergy & Precautions, NPO status , Patient's Chart, lab work & pertinent test results  History of Anesthesia Complications Negative for: history of anesthetic complications  Airway Mallampati: II  TM Distance: >3 FB Neck ROM: Full    Dental  (+) Dental Advisory Given, Teeth Intact   Pulmonary neg pulmonary ROS   breath sounds clear to auscultation       Cardiovascular negative cardio ROS  Rhythm:Regular Rate:Normal     Neuro/Psych  Headaches  negative psych ROS   GI/Hepatic Neg liver ROS,GERD  Medicated and Controlled,,  Endo/Other  BMI 34.4  Renal/GU negative Renal ROS     Musculoskeletal   Abdominal   Peds  Hematology negative hematology ROS (+)   Anesthesia Other Findings   Reproductive/Obstetrics                             Anesthesia Physical Anesthesia Plan  ASA: 2  Anesthesia Plan: General   Post-op Pain Management: Tylenol PO (pre-op)*   Induction: Intravenous  PONV Risk Score and Plan: 3 and Ondansetron, Dexamethasone and Treatment may vary due to age or medical condition  Airway Management Planned: Oral ETT  Additional Equipment: None  Intra-op Plan:   Post-operative Plan: Extubation in OR  Informed Consent: I have reviewed the patients History and Physical, chart, labs and discussed the procedure including the risks, benefits and alternatives for the proposed anesthesia with the patient or authorized representative who has indicated his/her understanding and acceptance.     Dental advisory given  Plan Discussed with: CRNA and Surgeon  Anesthesia Plan Comments:         Anesthesia Quick Evaluation

## 2023-08-29 NOTE — Anesthesia Postprocedure Evaluation (Signed)
Anesthesia Post Note  Patient: Dawn Mcguire  Procedure(s) Performed: PLIF - L4-L5 - Posterior Lateral and Interbody fusion (Back)     Patient location during evaluation: PACU Anesthesia Type: General Level of consciousness: patient cooperative, oriented and awake and alert Pain management: pain level controlled Vital Signs Assessment: post-procedure vital signs reviewed and stable Respiratory status: spontaneous breathing, nonlabored ventilation and respiratory function stable Cardiovascular status: blood pressure returned to baseline and stable Postop Assessment: no apparent nausea or vomiting Anesthetic complications: no   No notable events documented.  Last Vitals:  Vitals:   08/29/23 1450 08/29/23 1602  BP: 120/67 139/83  Pulse: 68 68  Resp: 20 20  Temp: 36.6 C 36.9 C  SpO2: 98% 99%                  Brylon Brenning,E. Donda Friedli

## 2023-09-01 ENCOUNTER — Encounter (HOSPITAL_COMMUNITY): Payer: Self-pay | Admitting: Neurological Surgery

## 2023-09-22 ENCOUNTER — Other Ambulatory Visit: Payer: Self-pay | Admitting: Neurological Surgery

## 2023-09-22 ENCOUNTER — Inpatient Hospital Stay (HOSPITAL_COMMUNITY): Payer: 59 | Admitting: Anesthesiology

## 2023-09-22 ENCOUNTER — Other Ambulatory Visit: Payer: Self-pay

## 2023-09-22 ENCOUNTER — Encounter (HOSPITAL_COMMUNITY): Payer: Self-pay | Admitting: Neurological Surgery

## 2023-09-22 ENCOUNTER — Encounter (HOSPITAL_COMMUNITY)
Admission: AD | Disposition: A | Discharge status: Home Health | Payer: Self-pay | Source: Ambulatory Visit | Attending: Neurological Surgery

## 2023-09-22 ENCOUNTER — Inpatient Hospital Stay (HOSPITAL_COMMUNITY)
Admission: AD | Admit: 2023-09-22 | Discharge: 2023-09-26 | DRG: 858 | Disposition: A | Discharge status: Home Health | Payer: 59 | Source: Ambulatory Visit | Attending: Neurological Surgery | Admitting: Neurological Surgery

## 2023-09-22 DIAGNOSIS — T847XXD Infection and inflammatory reaction due to other internal orthopedic prosthetic devices, implants and grafts, subsequent encounter: Secondary | ICD-10-CM

## 2023-09-22 DIAGNOSIS — K219 Gastro-esophageal reflux disease without esophagitis: Secondary | ICD-10-CM | POA: Diagnosis present

## 2023-09-22 DIAGNOSIS — T8149XA Infection following a procedure, other surgical site, initial encounter: Principal | ICD-10-CM | POA: Diagnosis present

## 2023-09-22 DIAGNOSIS — Y831 Surgical operation with implant of artificial internal device as the cause of abnormal reaction of the patient, or of later complication, without mention of misadventure at the time of the procedure: Secondary | ICD-10-CM | POA: Diagnosis present

## 2023-09-22 DIAGNOSIS — Z8261 Family history of arthritis: Secondary | ICD-10-CM | POA: Diagnosis not present

## 2023-09-22 DIAGNOSIS — I4891 Unspecified atrial fibrillation: Secondary | ICD-10-CM | POA: Diagnosis present

## 2023-09-22 DIAGNOSIS — Z882 Allergy status to sulfonamides status: Secondary | ICD-10-CM | POA: Diagnosis not present

## 2023-09-22 DIAGNOSIS — Z880 Allergy status to penicillin: Secondary | ICD-10-CM

## 2023-09-22 DIAGNOSIS — Z79899 Other long term (current) drug therapy: Secondary | ICD-10-CM | POA: Diagnosis not present

## 2023-09-22 DIAGNOSIS — B9561 Methicillin susceptible Staphylococcus aureus infection as the cause of diseases classified elsewhere: Secondary | ICD-10-CM | POA: Diagnosis present

## 2023-09-22 DIAGNOSIS — Z6834 Body mass index (BMI) 34.0-34.9, adult: Secondary | ICD-10-CM

## 2023-09-22 DIAGNOSIS — Z888 Allergy status to other drugs, medicaments and biological substances status: Secondary | ICD-10-CM

## 2023-09-22 DIAGNOSIS — Z8249 Family history of ischemic heart disease and other diseases of the circulatory system: Secondary | ICD-10-CM | POA: Diagnosis not present

## 2023-09-22 DIAGNOSIS — E669 Obesity, unspecified: Secondary | ICD-10-CM | POA: Diagnosis present

## 2023-09-22 DIAGNOSIS — Z809 Family history of malignant neoplasm, unspecified: Secondary | ICD-10-CM | POA: Diagnosis not present

## 2023-09-22 DIAGNOSIS — Z91048 Other nonmedicinal substance allergy status: Secondary | ICD-10-CM

## 2023-09-22 DIAGNOSIS — S31000A Unspecified open wound of lower back and pelvis without penetration into retroperitoneum, initial encounter: Secondary | ICD-10-CM

## 2023-09-22 DIAGNOSIS — Z91040 Latex allergy status: Secondary | ICD-10-CM | POA: Diagnosis not present

## 2023-09-22 DIAGNOSIS — Z881 Allergy status to other antibiotic agents status: Secondary | ICD-10-CM

## 2023-09-22 DIAGNOSIS — T847XXA Infection and inflammatory reaction due to other internal orthopedic prosthetic devices, implants and grafts, initial encounter: Principal | ICD-10-CM

## 2023-09-22 DIAGNOSIS — Z833 Family history of diabetes mellitus: Secondary | ICD-10-CM | POA: Diagnosis not present

## 2023-09-22 DIAGNOSIS — T8141XA Infection following a procedure, superficial incisional surgical site, initial encounter: Principal | ICD-10-CM | POA: Diagnosis present

## 2023-09-22 DIAGNOSIS — T8142XA Infection following a procedure, deep incisional surgical site, initial encounter: Secondary | ICD-10-CM | POA: Diagnosis not present

## 2023-09-22 DIAGNOSIS — G0491 Myelitis, unspecified: Secondary | ICD-10-CM | POA: Diagnosis not present

## 2023-09-22 HISTORY — PX: LUMBAR WOUND DEBRIDEMENT: SHX1988

## 2023-09-22 LAB — CBC
HCT: 38.2 % (ref 36.0–46.0)
Hemoglobin: 13.1 g/dL (ref 12.0–15.0)
MCH: 30.8 pg (ref 26.0–34.0)
MCHC: 34.3 g/dL (ref 30.0–36.0)
MCV: 89.7 fL (ref 80.0–100.0)
Platelets: 330 10*3/uL (ref 150–400)
RBC: 4.26 MIL/uL (ref 3.87–5.11)
RDW: 12.6 % (ref 11.5–15.5)
WBC: 22.3 10*3/uL — ABNORMAL HIGH (ref 4.0–10.5)
nRBC: 0 % (ref 0.0–0.2)

## 2023-09-22 SURGERY — LUMBAR WOUND DEBRIDEMENT
Anesthesia: General | Site: Back

## 2023-09-22 MED ORDER — ZINC 50 MG PO TABS: 50.0000 mg | ORAL_TABLET | Freq: Every day | ORAL | Status: DC

## 2023-09-22 MED ORDER — HYDROMORPHONE HCL 1 MG/ML IJ SOLN
INTRAMUSCULAR | Status: DC | PRN
  Administered 2023-09-22 (×2): .5 mg via INTRAVENOUS

## 2023-09-22 MED ORDER — HYDROMORPHONE HCL 1 MG/ML IJ SOLN
INTRAMUSCULAR | Status: AC
  Filled 2023-09-22: qty 0.5

## 2023-09-22 MED ORDER — ACETAMINOPHEN 325 MG PO TABS
650.0000 mg | ORAL_TABLET | ORAL | Status: DC | PRN
  Administered 2023-09-24 – 2023-09-25 (×2): 650 mg via ORAL
  Filled 2023-09-22 (×2): qty 2

## 2023-09-22 MED ORDER — METOPROLOL TARTRATE 5 MG/5ML IV SOLN
INTRAVENOUS | Status: AC
  Filled 2023-09-22: qty 5

## 2023-09-22 MED ORDER — SODIUM CHLORIDE 0.9% FLUSH: 3.0000 mL | INTRAVENOUS | Status: DC | PRN

## 2023-09-22 MED ORDER — SODIUM CHLORIDE 0.9 % IV SOLN
250.0000 mL | INTRAVENOUS | Status: AC
  Administered 2023-09-22: 250 mL via INTRAVENOUS

## 2023-09-22 MED ORDER — ROCURONIUM BROMIDE 10 MG/ML (PF) SYRINGE
PREFILLED_SYRINGE | INTRAVENOUS | Status: DC | PRN
  Administered 2023-09-22: 40 mg via INTRAVENOUS

## 2023-09-22 MED ORDER — ZINC SULFATE 220 (50 ZN) MG PO CAPS
220.0000 mg | ORAL_CAPSULE | Freq: Every day | ORAL | Status: DC
  Administered 2023-09-23 – 2023-09-26 (×4): 220 mg via ORAL
  Filled 2023-09-22 (×4): qty 1

## 2023-09-22 MED ORDER — ACETAMINOPHEN 10 MG/ML IV SOLN
INTRAVENOUS | Status: DC | PRN
  Administered 2023-09-22: 1000 mg via INTRAVENOUS

## 2023-09-22 MED ORDER — POTASSIUM CHLORIDE IN NACL 20-0.9 MEQ/L-% IV SOLN
INTRAVENOUS | Status: AC
  Filled 2023-09-22 (×4): qty 1000

## 2023-09-22 MED ORDER — ACETAMINOPHEN 10 MG/ML IV SOLN
INTRAVENOUS | Status: AC
  Filled 2023-09-22: qty 100

## 2023-09-22 MED ORDER — ACETAMINOPHEN 160 MG/5ML PO SOLN: 325.0000 mg | Freq: Once | ORAL | Status: DC | PRN

## 2023-09-22 MED ORDER — OXYCODONE-ACETAMINOPHEN 5-325 MG PO TABS
1.0000 | ORAL_TABLET | ORAL | Status: DC | PRN
  Administered 2023-09-22 – 2023-09-26 (×17): 1 via ORAL
  Filled 2023-09-22 (×17): qty 1

## 2023-09-22 MED ORDER — HYDROMORPHONE HCL 1 MG/ML IJ SOLN
0.2500 mg | INTRAMUSCULAR | Status: DC | PRN
  Administered 2023-09-22 (×3): 0.5 mg via INTRAVENOUS

## 2023-09-22 MED ORDER — BIOTIN 1 MG PO CAPS: 1.0000 mg | ORAL_CAPSULE | Freq: Every day | ORAL | Status: DC

## 2023-09-22 MED ORDER — ONDANSETRON HCL 4 MG/2ML IJ SOLN: 4.0000 mg | Freq: Four times a day (QID) | INTRAMUSCULAR | Status: DC | PRN

## 2023-09-22 MED ORDER — 0.9 % SODIUM CHLORIDE (POUR BTL) OPTIME
TOPICAL | Status: DC | PRN
  Administered 2023-09-22: 1000 mL

## 2023-09-22 MED ORDER — CHLORHEXIDINE GLUCONATE 0.12 % MT SOLN: 15.0000 mL | Freq: Once | OROMUCOSAL | Status: DC

## 2023-09-22 MED ORDER — ONDANSETRON HCL 4 MG/2ML IJ SOLN
INTRAMUSCULAR | Status: DC | PRN
  Administered 2023-09-22: 4 mg via INTRAVENOUS

## 2023-09-22 MED ORDER — METOPROLOL TARTRATE 5 MG/5ML IV SOLN
2.5000 mg | Freq: Once | INTRAVENOUS | Status: AC
  Administered 2023-09-22: 2.5 mg via INTRAVENOUS

## 2023-09-22 MED ORDER — DEXTROSE 5 % IV SOLN
INTRAVENOUS | Status: DC | PRN
  Administered 2023-09-22: 2 g via INTRAVENOUS

## 2023-09-22 MED ORDER — ACETAMINOPHEN 10 MG/ML IV SOLN: 1000.0000 mg | Freq: Once | INTRAVENOUS | Status: DC | PRN

## 2023-09-22 MED ORDER — ACETAMINOPHEN 500 MG PO TABS: 1000.0000 mg | ORAL_TABLET | Freq: Once | ORAL | Status: DC

## 2023-09-22 MED ORDER — PROPOFOL 10 MG/ML IV BOLUS
INTRAVENOUS | Status: AC
  Filled 2023-09-22: qty 20

## 2023-09-22 MED ORDER — SCOPOLAMINE 1 MG/3DAYS TD PT72: 1.0000 | MEDICATED_PATCH | TRANSDERMAL | Status: DC

## 2023-09-22 MED ORDER — SODIUM CHLORIDE 0.9 % IV SOLN
2.0000 g | INTRAVENOUS | Status: DC
  Administered 2023-09-22 – 2023-09-23 (×2): 2 g via INTRAVENOUS
  Filled 2023-09-22 (×2): qty 20

## 2023-09-22 MED ORDER — SENNA 8.6 MG PO TABS
1.0000 | ORAL_TABLET | Freq: Two times a day (BID) | ORAL | Status: DC
  Administered 2023-09-22 – 2023-09-24 (×4): 8.6 mg via ORAL
  Filled 2023-09-22 (×6): qty 1

## 2023-09-22 MED ORDER — VANCOMYCIN HCL 1000 MG IV SOLR
INTRAVENOUS | Status: DC | PRN
  Administered 2023-09-22: 1000 mg

## 2023-09-22 MED ORDER — SUGAMMADEX SODIUM 200 MG/2ML IV SOLN
INTRAVENOUS | Status: AC
  Filled 2023-09-22: qty 2

## 2023-09-22 MED ORDER — ACETAMINOPHEN 325 MG PO TABS: 325.0000 mg | ORAL_TABLET | Freq: Once | ORAL | Status: DC | PRN

## 2023-09-22 MED ORDER — ACETAMINOPHEN 650 MG RE SUPP: 650.0000 mg | RECTAL | Status: DC | PRN

## 2023-09-22 MED ORDER — HYDROMORPHONE HCL 1 MG/ML IJ SOLN
INTRAMUSCULAR | Status: AC
  Filled 2023-09-22: qty 1

## 2023-09-22 MED ORDER — DROPERIDOL 2.5 MG/ML IJ SOLN: 0.6250 mg | Freq: Once | INTRAMUSCULAR | Status: DC | PRN

## 2023-09-22 MED ORDER — VANCOMYCIN HCL 1000 MG IV SOLR
INTRAVENOUS | Status: DC | PRN
  Administered 2023-09-22: 1000 mg via INTRAVENOUS

## 2023-09-22 MED ORDER — ORAL CARE MOUTH RINSE: 15.0000 mL | Freq: Once | OROMUCOSAL | Status: DC

## 2023-09-22 MED ORDER — SUGAMMADEX SODIUM 200 MG/2ML IV SOLN
INTRAVENOUS | Status: DC | PRN
  Administered 2023-09-22: 200 mg via INTRAVENOUS

## 2023-09-22 MED ORDER — PANTOPRAZOLE SODIUM 20 MG PO TBEC
20.0000 mg | DELAYED_RELEASE_TABLET | Freq: Every day | ORAL | Status: DC
  Administered 2023-09-23 – 2023-09-26 (×4): 20 mg via ORAL
  Filled 2023-09-22 (×4): qty 1

## 2023-09-22 MED ORDER — MEPERIDINE HCL 25 MG/ML IJ SOLN: 6.2500 mg | INTRAMUSCULAR | Status: DC | PRN

## 2023-09-22 MED ORDER — ROCURONIUM BROMIDE 10 MG/ML (PF) SYRINGE
PREFILLED_SYRINGE | INTRAVENOUS | Status: AC
  Filled 2023-09-22: qty 10

## 2023-09-22 MED ORDER — MORPHINE SULFATE (PF) 2 MG/ML IV SOLN: 2.0000 mg | INTRAVENOUS | Status: DC | PRN

## 2023-09-22 MED ORDER — PHENOL 1.4 % MT LIQD: 1.0000 | OROMUCOSAL | Status: DC | PRN

## 2023-09-22 MED ORDER — DILTIAZEM HCL-DEXTROSE 125-5 MG/125ML-% IV SOLN (PREMIX)
5.0000 mg/h | INTRAVENOUS | Status: DC
  Administered 2023-09-22: 2.5 mg/h via INTRAVENOUS
  Administered 2023-09-22: 5 mg/h via INTRAVENOUS
  Administered 2023-09-23: 2.5 mg/h via INTRAVENOUS
  Filled 2023-09-22 (×3): qty 125

## 2023-09-22 MED ORDER — LACTATED RINGERS IV SOLN: INTRAVENOUS | Status: DC

## 2023-09-22 MED ORDER — LIDOCAINE 2% (20 MG/ML) 5 ML SYRINGE
INTRAMUSCULAR | Status: AC
  Filled 2023-09-22: qty 5

## 2023-09-22 MED ORDER — ONDANSETRON HCL 4 MG/2ML IJ SOLN
INTRAMUSCULAR | Status: AC
  Filled 2023-09-22: qty 2

## 2023-09-22 MED ORDER — MIDAZOLAM HCL 2 MG/2ML IJ SOLN
INTRAMUSCULAR | Status: DC | PRN
  Administered 2023-09-22: 2 mg via INTRAVENOUS

## 2023-09-22 MED ORDER — VANCOMYCIN HCL 1000 MG IV SOLR
INTRAVENOUS | Status: AC
  Filled 2023-09-22: qty 20

## 2023-09-22 MED ORDER — FENTANYL CITRATE (PF) 250 MCG/5ML IJ SOLN
INTRAMUSCULAR | Status: DC | PRN
  Administered 2023-09-22 (×2): 50 ug via INTRAVENOUS
  Administered 2023-09-22: 100 ug via INTRAVENOUS
  Administered 2023-09-22: 50 ug via INTRAVENOUS

## 2023-09-22 MED ORDER — ONDANSETRON HCL 4 MG PO TABS: 4.0000 mg | ORAL_TABLET | Freq: Four times a day (QID) | ORAL | Status: DC | PRN

## 2023-09-22 MED ORDER — FENTANYL CITRATE (PF) 250 MCG/5ML IJ SOLN
INTRAMUSCULAR | Status: AC
  Filled 2023-09-22: qty 5

## 2023-09-22 MED ORDER — SODIUM CHLORIDE 0.9 % IV SOLN: 1.0000 g | INTRAVENOUS | Status: DC

## 2023-09-22 MED ORDER — VASHE WOUND IRRIGATION OPTIME
TOPICAL | Status: DC | PRN
  Administered 2023-09-22: 34 [oz_av]

## 2023-09-22 MED ORDER — FENTANYL CITRATE (PF) 100 MCG/2ML IJ SOLN
INTRAMUSCULAR | Status: AC
  Filled 2023-09-22: qty 2

## 2023-09-22 MED ORDER — VITAMIN C 500 MG PO TABS
1000.0000 mg | ORAL_TABLET | Freq: Every day | ORAL | Status: DC
  Administered 2023-09-23 – 2023-09-26 (×4): 1000 mg via ORAL
  Filled 2023-09-22 (×5): qty 2

## 2023-09-22 MED ORDER — MIDAZOLAM HCL 2 MG/2ML IJ SOLN
INTRAMUSCULAR | Status: AC
  Filled 2023-09-22: qty 2

## 2023-09-22 MED ORDER — CYCLOBENZAPRINE HCL 10 MG PO TABS
10.0000 mg | ORAL_TABLET | Freq: Three times a day (TID) | ORAL | Status: DC | PRN
  Administered 2023-09-25 – 2023-09-26 (×5): 10 mg via ORAL
  Filled 2023-09-22 (×5): qty 1

## 2023-09-22 MED ORDER — PROPOFOL 10 MG/ML IV BOLUS
INTRAVENOUS | Status: DC | PRN
  Administered 2023-09-22: 150 mg via INTRAVENOUS

## 2023-09-22 MED ORDER — SODIUM CHLORIDE 0.9% FLUSH
3.0000 mL | Freq: Two times a day (BID) | INTRAVENOUS | Status: DC
  Administered 2023-09-22 – 2023-09-26 (×6): 3 mL via INTRAVENOUS

## 2023-09-22 MED ORDER — LIDOCAINE 2% (20 MG/ML) 5 ML SYRINGE
INTRAMUSCULAR | Status: DC | PRN
  Administered 2023-09-22: 60 mg via INTRAVENOUS

## 2023-09-22 MED ORDER — OMEPRAZOLE MAGNESIUM 20 MG PO TBEC: 20.0000 mg | DELAYED_RELEASE_TABLET | Freq: Every day | ORAL | Status: DC

## 2023-09-22 MED ORDER — MENTHOL 3 MG MT LOZG: 1.0000 | LOZENGE | OROMUCOSAL | Status: DC | PRN

## 2023-09-22 MED ORDER — VANCOMYCIN HCL 1250 MG/250ML IV SOLN
1250.0000 mg | Freq: Two times a day (BID) | INTRAVENOUS | Status: DC
  Administered 2023-09-23: 1250 mg via INTRAVENOUS
  Filled 2023-09-22 (×3): qty 250

## 2023-09-22 SURGICAL SUPPLY — 40 items
BAG COUNTER SPONGE SURGICOUNT (BAG) ×1 IMPLANT
BENZOIN TINCTURE PRP APPL 2/3 (GAUZE/BANDAGES/DRESSINGS) ×1 IMPLANT
CANISTER SUCT 3000ML PPV (MISCELLANEOUS) ×1 IMPLANT
DRAPE LAPAROTOMY 100X72X124 (DRAPES) ×1 IMPLANT
DRSG OPSITE POSTOP 4X6 (GAUZE/BANDAGES/DRESSINGS) IMPLANT
DRSG OPSITE POSTOP 4X8 (GAUZE/BANDAGES/DRESSINGS) IMPLANT
DURAPREP 26ML APPLICATOR (WOUND CARE) IMPLANT
DURAPREP 6ML APPLICATOR 50/CS (WOUND CARE) IMPLANT
ELECT REM PT RETURN 9FT ADLT (ELECTROSURGICAL) ×1 IMPLANT
ELECTRODE REM PT RTRN 9FT ADLT (ELECTROSURGICAL) ×1 IMPLANT
GAUZE 4X4 16PLY ~~LOC~~+RFID DBL (SPONGE) IMPLANT
GLOVE BIO SURGEON STRL SZ7 (GLOVE) IMPLANT
GLOVE BIO SURGEON STRL SZ8 (GLOVE) ×1 IMPLANT
GLOVE BIOGEL PI IND STRL 7.0 (GLOVE) IMPLANT
GOWN STRL REUS W/ TWL LRG LVL3 (GOWN DISPOSABLE) IMPLANT
GOWN STRL REUS W/ TWL XL LVL3 (GOWN DISPOSABLE) IMPLANT
GOWN STRL REUS W/TWL 2XL LVL3 (GOWN DISPOSABLE) ×1 IMPLANT
KIT BASIN OR (CUSTOM PROCEDURE TRAY) ×1 IMPLANT
KIT DRSG PREVENA PLUS 7DAY 125 (MISCELLANEOUS) IMPLANT
KIT PREVENA INCISION MGT 13 (CANNISTER) IMPLANT
KIT TURNOVER KIT B (KITS) ×1 IMPLANT
NDL HYPO 18GX1.5 BLUNT FILL (NEEDLE) IMPLANT
NDL HYPO 25X1 1.5 SAFETY (NEEDLE) ×1 IMPLANT
NDL SPNL 20GX3.5 QUINCKE YW (NEEDLE) IMPLANT
NEEDLE HYPO 18GX1.5 BLUNT FILL (NEEDLE) IMPLANT
NEEDLE HYPO 25X1 1.5 SAFETY (NEEDLE) ×1 IMPLANT
NEEDLE SPNL 20GX3.5 QUINCKE YW (NEEDLE) IMPLANT
NS IRRIG 1000ML POUR BTL (IV SOLUTION) ×1 IMPLANT
PACK LAMINECTOMY NEURO (CUSTOM PROCEDURE TRAY) ×1 IMPLANT
PAD ARMBOARD 7.5X6 YLW CONV (MISCELLANEOUS) ×3 IMPLANT
STRIP CLOSURE SKIN 1/2X4 (GAUZE/BANDAGES/DRESSINGS) ×1 IMPLANT
SUT VIC AB 0 CT1 18XCR BRD8 (SUTURE) ×1 IMPLANT
SUT VIC AB 2-0 CP2 18 (SUTURE) ×1 IMPLANT
SUT VIC AB 3-0 SH 8-18 (SUTURE) ×1 IMPLANT
SWAB COLLECTION DEVICE MRSA (MISCELLANEOUS) ×1 IMPLANT
SWAB CULTURE ESWAB REG 1ML (MISCELLANEOUS) ×1 IMPLANT
SYR 3ML LL SCALE MARK (SYRINGE) IMPLANT
TOWEL GREEN STERILE (TOWEL DISPOSABLE) ×1 IMPLANT
TOWEL GREEN STERILE FF (TOWEL DISPOSABLE) ×1 IMPLANT
WATER STERILE IRR 1000ML POUR (IV SOLUTION) ×1 IMPLANT

## 2023-09-22 NOTE — Progress Notes (Signed)
 Pharmacy Antibiotic Note  Dawn Mcguire is a 58 y.o. female admitted on 09/22/2023 with surgical prophylaxis.  Pharmacy has been consulted for vanco dosing.  Plan: Vancomycin 1250 mg IV every 12 hours.  eAUC 465 scr utilized: 0.8 mg/dL  Height: 5\' 8"  (172.7 cm) Weight: 102.5 kg (226 lb) IBW/kg (Calculated) : 63.9  Temp (24hrs), Avg:98.8 F (37.1 C), Min:98 F (36.7 C), Max:99.9 F (37.7 C)  Recent Labs  Lab 09/22/23 1605  WBC 22.3*    CrCl cannot be calculated (Patient's most recent lab result is older than the maximum 21 days allowed.).    Allergies  Allergen Reactions   Latex Rash    Tears skin and break out   Sulfa Antibiotics Itching and Nausea And Vomiting    Skin becomes red and feels like it is burning   Sulfasalazine Hives, Itching and Nausea And Vomiting    Skin becomes red and feels like it is burning    Tape     Tears skin. Patient prefers paper tape    Corticosteroids Hives, Itching and Rash    fever   Nitrofurantoin Monohyd Macro Nausea And Vomiting   Penicillins Hives, Itching and Rash            Thank you for allowing pharmacy to be a part of this patient's care.  Greta Doom BS, PharmD, BCPS Clinical Pharmacist 09/22/2023 9:38 PM  Contact: [phone] after 3 PM  "Be curious, not judgmental..." -Debbora Dus

## 2023-09-22 NOTE — Anesthesia Procedure Notes (Signed)
 Procedure Name: Intubation Date/Time: 09/22/2023 5:13 PM  Performed by: Colbert Coyer, CRNAPre-anesthesia Checklist: Patient identified, Emergency Drugs available, Suction available and Patient being monitored Patient Re-evaluated:Patient Re-evaluated prior to induction Oxygen Delivery Method: Circle System Utilized Preoxygenation: Pre-oxygenation with 100% oxygen Induction Type: IV induction Ventilation: Mask ventilation without difficulty Laryngoscope Size: Mac and 4 Grade View: Grade I Tube type: Oral Tube size: 7.0 mm Number of attempts: 1 Airway Equipment and Method: Stylet Placement Confirmation: ETT inserted through vocal cords under direct vision, positive ETCO2 and breath sounds checked- equal and bilateral Secured at: 22 cm Tube secured with: Tape Dental Injury: Teeth and Oropharynx as per pre-operative assessment

## 2023-09-22 NOTE — Progress Notes (Signed)
 VAST consult. Arrived to unit. Assessed patient. Current L PIV infusing without complications. All infusions compatible at this time. Notified nurse no further accessed needed at this time. Nurse VU. Tomasita Morrow, RN VAST

## 2023-09-22 NOTE — Consult Note (Addendum)
 Cardiology Consultation   Patient ID: Analynn Daum MRN: [id number]; DOB: 1965-11-14  Admit date: 09/22/2023 Date of Consult: 09/22/2023  PCP:  Mliss Sax, MD   [facility] Providers Cardiologist:  New to [facility] - Dr. Jacques Navy   Patient Profile:   Lucilla Petrenko is a 58 y.o. female with no significant past medical history other than back issue who is being seen 09/22/2023 for the evaluation of new onset of atrial fibrillation with RVR at the request of Dr. Bradley Ferris.  History of Present Illness:   Ms. Moritz is a pleasant 58 year old female with no significant past medical history other than back issue.  Her mother has history of atrial fibrillation.  The patient herself has never had any cardiac issue nor has she ever seen a cardiologist in the past.  She does not drink, smoke or do any illicit drug.  She recently underwent lumbar fusion surgery on 08/29/2023, however developed wound infection and require irrigation and debridement of the lumbar wound today.  She reportedly went into atrial fibrillation with RVR during the surgery.  Unfortunately, the last EKG in our system was dating back to 2023.  She has no cardiac awareness of atrial fibrillation.  She denies any recent chest pain, shortness of breath, fatigue or dizziness.  She was given 5 mg IV Lopressor and heart rate is currently in the 120-130s.  Cardiology service consulted for atrial fibrillation with RVR.   Past Medical History:  Diagnosis Date   Arthritis    Carpal tunnel syndrome of right wrist    Closed displaced trimalleolar fracture of right ankle 11/20/2018   GERD (gastroesophageal reflux disease)    Psoriasis     Past Surgical History:  Procedure Laterality Date   ABDOMINAL HYSTERECTOMY     BLADDER REPAIR     BREAST SURGERY     CARPAL TUNNEL RELEASE     on the right wrist   CHOLECYSTECTOMY     INSERTION OF MESH N/A 10/16/2017   Procedure: INSERTION OF MESH;  Surgeon: Manus Rudd, MD;  Location: [facility] OR;  Service: General;  Laterality: N/A;   IR US GUIDE BX ASP/DRAIN  12/31/2017   ORIF ANKLE FRACTURE Right 11/20/2018   Procedure: OPEN REDUCTION INTERNAL FIXATION (ORIF) ANKLE FRACTURE AND SYNDESMOSIS;  Surgeon: Teryl Lucy, MD;  Location:  SURGERY CENTER;  Service: Orthopedics;  Laterality: Right;   POSTERIOR FUSION PEDICLE SCREW PLACEMENT N/A 08/29/2023   Procedure: PLIF - L4-L5 - Posterior Lateral and Interbody fusion;  Surgeon: Arman Bogus, MD;  Location: [facility] OR;  Service: Neurosurgery;  Laterality: N/A;   VENTRAL HERNIA REPAIR N/A 10/16/2017   Procedure: OPEN VENTRAL HERNIA REPAIR WITH MESH;  Surgeon: Manus Rudd, MD;  Location: [facility] OR;  Service: General;  Laterality: N/A;     Home Medications:  Prior to Admission medications   Medication Sig Start Date End Date Taking? Authorizing Provider  acetaminophen (TYLENOL) 500 MG tablet Take 1,000 mg by mouth 2 (two) times daily.   Yes [provider]  Ascorbic Acid (VITAMIN C) 1000 MG tablet Take 1,000 mg by mouth daily.   Yes [provider]  Biotin 1 MG CAPS Take 1 mg by mouth daily.   Yes [provider]  Cyanocobalamin (B-12 PO) Take 1 capsule by mouth daily.   Yes [provider]  cyclobenzaprine (FLEXERIL) 5 MG tablet Use 1 to 2 tablets (5 to 10 mg) nightly as needed for muscle spasms. 08/29/23  Yes Meyran, Tiana Loft, NP  loratadine-pseudoephedrine (CLARITIN-D 12-HOUR) 5-120 MG tablet Take 1 tablet by mouth daily.   Yes [provider]  omeprazole (PRILOSEC OTC) 20 MG tablet Take 20 mg by mouth daily.   Yes [provider]  oxyCODONE-acetaminophen (PERCOCET) 5-325 MG tablet Take 1 tablet by mouth every 4 (four) hours as needed for severe pain (pain score 7-10). 08/29/23 08/28/24 Yes Meyran, Tiana Loft, NP  pseudoephedrine (SUDAFED) 30 MG tablet Take 30 mg by mouth every 6 (six) hours as needed for congestion.   Yes [provider]  Turmeric (QC TUMERIC COMPLEX PO) Take 1 tablet by mouth daily.   Yes [provider]  VITAMIN D PO Take 1 capsule by mouth daily.   Yes [provider]  Zinc 50 MG TABS Take 50 mg by mouth daily.   Yes [provider]    Inpatient Medications: Scheduled Meds:  acetaminophen  1,000 mg Oral Once   chlorhexidine  15 mL Mouth/Throat Once   Or   mouth rinse  15 mL Mouth Rinse Once   fentaNYL       HYDROmorphone       HYDROmorphone       metoprolol tartrate       scopolamine  1 patch Transdermal Q72H   Continuous Infusions:  acetaminophen     lactated ringers     lactated ringers     PRN Meds: acetaminophen, acetaminophen **OR** acetaminophen (TYLENOL) oral liquid 160 mg/5 mL, droperidol, fentaNYL, HYDROmorphone, HYDROmorphone, HYDROmorphone (DILAUDID) injection, meperidine (DEMEROL) injection, metoprolol tartrate  Allergies:    Allergies  Allergen Reactions   Latex Rash    Tears skin and break out   Sulfa Antibiotics Itching and Nausea And Vomiting    Skin becomes red and feels like it is burning   Sulfasalazine Hives, Itching and Nausea And Vomiting    Skin becomes red and feels like it is burning    Tape     Tears skin. Patient prefers paper tape    Corticosteroids Hives, Itching and Rash    fever   Nitrofurantoin Monohyd Macro Nausea And Vomiting   Penicillins Hives, Itching and Rash          Social History:   Social History   Socioeconomic History   Marital status: Married    Spouse name: Not on file   Number of children: Not on file   Years of education: Not on file   Highest education level: Not on file  Occupational History   Not on file  Tobacco Use   Smoking status: Never   Smokeless tobacco: Never  Vaping Use   Vaping status: Never Used  Substance and Sexual Activity   Alcohol use: No   Drug use: No   Sexual activity: Yes    Birth control/protection: Surgical  Other Topics Concern   Not on file  Social History  Narrative   Not on file   Social Drivers of Health   Financial Resource Strain: Not on file  Food Insecurity: Not on file  Transportation Needs: Not on file  Physical Activity: Not on file  Stress: Not on file  Social Connections: Not on file  Intimate Partner Violence: Not on file    Family History:    Family History  Problem Relation Age of Onset   Arthritis Mother    Heart attack Mother    High blood pressure Mother    Cancer Father    Diabetes Father    Arthritis Brother    Arthritis Brother  Arthritis Brother    Diabetes Son      ROS:  Please see the history of present illness.   All other ROS reviewed and negative.     Physical Exam/Data:   Vitals:   09/22/23 1830 09/22/23 1845 09/22/23 1900 09/22/23 1915  BP: (!) 141/83 (!) 139/102 135/80 (!) 119/92  Pulse: (!) 115 (!) 141 (!) 137 (!) 135  Resp: 15 19 (!) 22 20  Temp:      TempSrc:      SpO2: 96% 97% 95% 98%  Weight:      Height:        Intake/Output Summary (Last 24 hours) at 09/22/2023 1925 Last data filed at 09/22/2023 1821 Gross per 24 hour  Intake 1150 ml  Output 100 ml  Net 1050 ml      09/22/2023    4:13 PM 08/29/2023    8:50 AM 08/26/2023    1:36 PM  Last 3 Weights  Weight (lbs) 226 lb 228 lb 229 lb 12.8 oz  Weight (kg) 102.513 kg 103.42 kg 104.237 kg     Body mass index is 34.36 kg/m.  General:  Well nourished, well developed, in no acute distress HEENT: normal Neck: no JVD Vascular: No carotid bruits; Distal pulses 2+ bilaterally Cardiac: Irregularly irregular, no murmur  Lungs:  clear to auscultation bilaterally, no wheezing, rhonchi or rales  Abd: soft, nontender, no hepatomegaly  Ext: no edema Musculoskeletal:  No deformities, BUE and BLE strength normal and equal Skin: warm and dry  Neuro:  CNs 2-12 intact, no focal abnormalities noted Psych:  Normal affect   EKG:  The EKG was personally reviewed and demonstrates: Atrial fibrillation with RVR Telemetry:  Telemetry was  personally reviewed and demonstrates: Atrial fibrillation with RVR with heart rate in the 120s to 140s  Relevant CV Studies:  N/A  Laboratory Data:  High Sensitivity Troponin:  No results for input(s): "TROPONINIHS" in the last 720 hours.   ChemistryNo results for input(s): "NA", "K", "CL", "CO2", "GLUCOSE", "BUN", "CREATININE", "CALCIUM", "MG", "GFRNONAA", "GFRAA", "ANIONGAP" in the last 168 hours.  No results for input(s): "PROT", "ALBUMIN", "AST", "ALT", "ALKPHOS", "BILITOT" in the last 168 hours. Lipids No results for input(s): "CHOL", "TRIG", "HDL", "LABVLDL", "LDLCALC", "CHOLHDL" in the last 168 hours.  Hematology Recent Labs  Lab 09/22/23 1605  WBC 22.3*  RBC 4.26  HGB 13.1  HCT 38.2  MCV 89.7  MCH 30.8  MCHC 34.3  RDW 12.6  PLT 330   Thyroid No results for input(s): "TSH", "FREET4" in the last 168 hours.  BNPNo results for input(s): "BNP", "PROBNP" in the last 168 hours.  DDimer No results for input(s): "DDIMER" in the last 168 hours.   Radiology/Studies:  No results found.   Assessment and Plan:   Newly diagnosed atrial fibrillation with RVR: Per anesthesiology service, patient had intermittent elevated heart rate during the surgery, toe surgery, EKG confirmed that she was in atrial fibrillation with RVR.  She has no cardiac awareness of palpitation, fluttering sensation, chest pain, shortness of breath or fatigue.  Unfortunately, she has not had any EKG since 2023.  She does not measure her heart rate at home either.  She does not drink, do any illicit drug or smoke.  Will focus on rate control for the time being with IV diltiazem.  Her CHA2DS2-Vasc score is 1 for female, question if she truly require any long-term anticoagulation therapy.  She is currently not a candidate for anticoagulation therapy given recent back surgery. Spoke  with Dr. Jacques Navy, obtain echocardiogram, potentially consider outpatient monitor   Risk Assessment/Risk Scores:           CHA2DS2-VASc Score = 1   This indicates a 0.6% annual risk of stroke. The patient's score is based upon: CHF History: 0 HTN History: 0 Diabetes History: 0 Stroke History: 0 Vascular Disease History: 0 Age Score: 0 Gender Score: 1          For questions or updates, please contact [facility] Please consult [email or web address] for contact info under    Ramond Dial, [place]  09/22/2023 7:25 PM  Patient seen and examined with Azalee Course PA-C.  Agree as above, with the following exceptions and changes as noted below.  Patient is a 58 year old female with no past cardiovascular history who presents for surgical management of lumbar wound infection today and underwent irrigation and debridement of lumbar wound using both blunt and sharp dissection into the deep tissues by Dr. Yetta Barre.  Per anesthesia, patient went into atrial fibrillation with rapid ventricular response during surgery, and now in recovery is in atrial fibrillation with rapid ventricular response heart rate around 129.  No prior history of atrial fibrillation and she is asymptomatic without palpitations.  She notes her mother would have atrial fibrillation after surgery.   Gen: NAD, CV: Irregular and tachycardic, no murmurs, Lungs: clear, Abd: soft, Extrem: Warm, well perfused, no edema, Neuro/Psych: alert and oriented x 3, normal mood and affect. All available labs, radiology testing, previous records reviewed.  -Baseline laboratory studies are grossly normal, would recommend obtaining a TSH. -Recommend echocardiogram to evaluate for atrial enlargement or structural heart disease. -CHA2DS2-VASc score is 1 for female.  Given spinal surgery, would defer anticoagulation until safe from neurosurgical standpoint.  However pending duration of atrial fibrillation she may not require ongoing anticoagulation.  Await results of echocardiogram for further risk stratification of the burden of atrial fibrillation, and likely she will  need a cardiac monitor prior to leaving the hospital to assess for atrial fibrillation burden if in sinus rhythm.  Parke Poisson, MD 09/22/23 8:20 PM

## 2023-09-22 NOTE — Transfer of Care (Signed)
 Immediate Anesthesia Transfer of Care Note  Patient: Dawn Mcguire  Procedure(s) Performed: Irrigation and debridement of lumbar wound (Back)  Patient Location: PACU  Anesthesia Type:General  Level of Consciousness: sedated  Airway & Oxygen Therapy: Patient Spontanous Breathing, Patient connected to nasal cannula oxygen, and oral airway  Post-op Assessment: Report given to RN and Post -op Vital signs reviewed and stable  Post vital signs: Reviewed and stable  Last Vitals:  Vitals Value Taken Time  BP 134/76 09/22/23 1816  Temp 37.3 C 09/22/23 1815  Pulse 112 09/22/23 1820  Resp 19 09/22/23 1820  SpO2 96 % 09/22/23 1820  Vitals shown include unfiled device data.  Last Pain:  Vitals:   09/22/23 1613  TempSrc: Oral  PainSc: 10-Worst pain ever         Complications: No notable events documented.

## 2023-09-22 NOTE — Op Note (Signed)
 09/22/2023  6:11 PM  PATIENT:  Dawn Mcguire  58 y.o. female  PRE-OPERATIVE DIAGNOSIS: Lumbar wound infection  POST-OPERATIVE DIAGNOSIS:  same  PROCEDURE: Irrigation and debridement of lumbar wound using both blunt and sharp dissection, into the deep tissues, wound length 80 mm.  SURGEON:  Marikay Alar, MD  ASSISTANTS: Verlin Dike, FNP  ANESTHESIA:   General  EBL: 100 ml  Total I/O In: 350 [IV Piggyback:350] Out: 100 [Blood:100]  BLOOD ADMINISTERED: none  DRAINS: Medium Hemovac  SPECIMEN: Cultures taken  INDICATION FOR PROCEDURE: This patient presented with increasing back pain with redness and some drainage from the wound. The patient tried conservative measures without relief. Pain was debilitating. Recommended irrigation and debridement of lumbar wound. Patient understood the risks, benefits, and alternatives and potential outcomes and wished to proceed.  PROCEDURE DETAILS: The patient was taken to the operating room and after induction of adequate generalized endotracheal anesthesia, the patient was rolled into the prone position on the Wilson frame and all pressure points were padded. The lumbar region was cleaned and then prepped with DuraPrep and draped in the usual sterile fashion. 5 cc of local anesthesia was injected and then a dorsal midline incision was made and carried down to the lumbo sacral fascia.  The old wound was ellipsed out.  There was immediate release of pus and this was cultured.  The fascia was opened and the paraspinous musculature was opened.  The hardware was tight and had good purchase.  We irrigated with Vashe solution.  Achieved hemostasis with bipolar cautery, placed a medium Hemovac drain, placed vancomycin powder into the wound and then closed the fascia with 0 Vicryl. I closed the subcutaneous tissues with 2-0 Vicryl and the subcuticular tissues with 3-0 Vicryl. The drapes were removed, an external wound VAC dressing was applied.  My nurse  practitioner was scrubbed for the entire procedure and helped with the irrigation and debridement and the closure as well as placement of the wound VAC. the patient was awakened from general anesthesia and transferred to the recovery room in stable condition. At the end of the procedure all sponge, needle and instrument counts were correct.    PLAN OF CARE: Admit to inpatient   PATIENT DISPOSITION:  PACU - hemodynamically stable.   Delay start of Pharmacological VTE agent (>24hrs) due to surgical blood loss or risk of bleeding:  yes

## 2023-09-22 NOTE — H&P (Signed)
 Subjective: Patient is a 58 y.o. female admitted for probable lumbar wound infection 4 weeks after lumbar fusion surgery. Onset of symptoms was 2 days ago, gradually worsening since that time.  The pain is rated moderate, and is located at the across the lower back. The pain is described as aching and occurs all day. The symptoms have been progressive. Symptoms are exacerbated by nothing in particular.  She presented to the office today with mild fever and tenderness around the wound and she felt like there was some wound swelling.  I was able to milk exudate out of the wound suggesting lumbar wound infection.  She is admitted for irrigation and debridement of lumbar wound  Past Medical History:  Diagnosis Date   Arthritis    Carpal tunnel syndrome of right wrist    Closed displaced trimalleolar fracture of right ankle 11/20/2018   GERD (gastroesophageal reflux disease)    Psoriasis     Past Surgical History:  Procedure Laterality Date   ABDOMINAL HYSTERECTOMY     BLADDER REPAIR     BREAST SURGERY     CARPAL TUNNEL RELEASE     on the right wrist   CHOLECYSTECTOMY     INSERTION OF MESH N/A 10/16/2017   Procedure: INSERTION OF MESH;  Surgeon: Manus Rudd, MD;  Location: [facility] OR;  Service: General;  Laterality: N/A;   IR US GUIDE BX ASP/DRAIN  12/31/2017   ORIF ANKLE FRACTURE Right 11/20/2018   Procedure: OPEN REDUCTION INTERNAL FIXATION (ORIF) ANKLE FRACTURE AND SYNDESMOSIS;  Surgeon: Teryl Lucy, MD;  Location: [facility];  Service: Orthopedics;  Laterality: Right;   POSTERIOR FUSION PEDICLE SCREW PLACEMENT N/A 08/29/2023   Procedure: PLIF - L4-L5 - Posterior Lateral and Interbody fusion;  Surgeon: Arman Bogus, MD;  Location: [facility] OR;  Service: Neurosurgery;  Laterality: N/A;   VENTRAL HERNIA REPAIR N/A 10/16/2017   Procedure: OPEN VENTRAL HERNIA REPAIR WITH MESH;  Surgeon: Manus Rudd, MD;  Location: [facility] OR;  Service: General;  Laterality: N/A;    Prior to Admission  medications   Medication Sig Start Date End Date Taking? Authorizing Provider  acetaminophen (TYLENOL) 500 MG tablet Take 1,000 mg by mouth 2 (two) times daily.    [provider]  Ascorbic Acid (VITAMIN C) 1000 MG tablet Take 1,000 mg by mouth daily.    [provider]  Biotin 1 MG CAPS Take 1 mg by mouth daily.    [provider]  Cyanocobalamin (B-12 PO) Take 1 capsule by mouth daily.    [provider]  cyclobenzaprine (FLEXERIL) 5 MG tablet Use 1 to 2 tablets (5 to 10 mg) nightly as needed for muscle spasms. 08/29/23   Meyran, Tiana Loft, NP  loratadine-pseudoephedrine (CLARITIN-D 12-HOUR) 5-120 MG tablet Take 1 tablet by mouth daily.    [provider]  omeprazole (PRILOSEC OTC) 20 MG tablet Take 20 mg by mouth daily.    [provider]  oxyCODONE-acetaminophen (PERCOCET) 5-325 MG tablet Take 1 tablet by mouth every 4 (four) hours as needed for severe pain (pain score 7-10). 08/29/23 08/28/24  Meyran, Tiana Loft, NP  pseudoephedrine (SUDAFED) 30 MG tablet Take 30 mg by mouth every 6 (six) hours as needed for congestion.    [provider]  Turmeric (QC TUMERIC COMPLEX PO) Take 1 tablet by mouth daily.    [provider]  VITAMIN D PO Take 1 capsule by mouth daily.    [provider]  Zinc 50 MG TABS Take  50 mg by mouth daily.    [provider]   Allergies  Allergen Reactions   Latex Rash    Tears skin and break out   Sulfa Antibiotics Itching and Nausea And Vomiting    Skin becomes red and feels like it is burning   Sulfasalazine Hives, Itching and Nausea And Vomiting    Skin becomes red and feels like it is burning    Tape     Tears skin. Patient prefers paper tape    Corticosteroids Hives, Itching and Rash    fever   Nitrofurantoin Monohyd Macro Nausea And Vomiting   Penicillins Hives, Itching and Rash          Social History   Tobacco Use   Smoking status: Never   Smokeless  tobacco: Never  Substance Use Topics   Alcohol use: No    Family History  Problem Relation Age of Onset   Arthritis Mother    Heart attack Mother    High blood pressure Mother    Cancer Father    Diabetes Father    Arthritis Brother    Arthritis Brother    Arthritis Brother    Diabetes Son      Review of Systems  Positive ROS: Negative  All other systems have been reviewed and were otherwise negative with the exception of those mentioned in the HPI and as above.  Objective: Vital signs in last 24 hours: BP: ()/()  Arterial Line BP: ()/()   General Appearance: Alert, cooperative, no distress, appears stated age Head: Normocephalic, without obvious abnormality, atraumatic Eyes: PERRL, conjunctiva/corneas clear, EOM's intact    Neck: Supple, symmetrical, trachea midline Back: Symmetric, no curvature, ROM normal, no CVA tenderness Lungs:  respirations unlabored Heart: Regular rate and rhythm Abdomen: Soft, non-tender Extremities: Extremities normal, atraumatic, no cyanosis or edema Pulses: 2+ and symmetric all extremities Skin: Skin color, texture, turgor normal, no rashes or lesions  NEUROLOGIC:   Mental status: Alert and oriented x4,  no aphasia, good attention span, fund of knowledge, and memory Motor Exam - grossly normal Sensory Exam - grossly normal Reflexes: 1+ Coordination - grossly normal Gait - grossly normal Balance - grossly normal Cranial Nerves: I: smell Not tested  II: visual acuity  OS: nl    OD: nl  II: visual fields Full to confrontation  II: pupils Equal, round, reactive to light  III,VII: ptosis None  III,IV,VI: extraocular muscles  Full ROM  V: mastication Normal  V: facial light touch sensation  Normal  V,VII: corneal reflex  Present  VII: facial muscle function - upper  Normal  VII: facial muscle function - lower Normal  VIII: hearing Not tested  IX: soft palate elevation  Normal  IX,X: gag reflex Present  XI: trapezius strength  5/5   XI: sternocleidomastoid strength 5/5  XI: neck flexion strength  5/5  XII: tongue strength  Normal    Data Review Lab Results  Component Value Date   WBC 10.6 (H) 08/26/2023   HGB 15.1 (H) 08/26/2023   HCT 45.4 08/26/2023   MCV 92.5 08/26/2023   PLT 306 08/26/2023   Lab Results  Component Value Date   NA 138 08/26/2023   K 4.1 08/26/2023   CL 103 08/26/2023   CO2 25 08/26/2023   BUN 14 08/26/2023   CREATININE 0.71 08/26/2023   GLUCOSE 100 (H) 08/26/2023   Lab Results  Component Value Date   INR 0.9 08/26/2023    Assessment/Plan:  Estimated body mass  index is 34.67 kg/m as calculated from the following:   Height as of 08/29/23: 5\' 8"  (1.727 m).   Weight as of 08/29/23: 103.4 kg. Patient admitted for irrigation and debridement of lumbar wound. Patient has failed a reasonable attempt at conservative therapy.  I explained the condition and procedure to the patient and answered any questions.  Patient wishes to proceed with procedure as planned. Understands risks/ benefits and typical outcomes of procedure.   Tia Alert 09/22/2023 3:54 PM

## 2023-09-22 NOTE — Anesthesia Postprocedure Evaluation (Signed)
 Anesthesia Post Note  Patient: Dawn Mcguire  Procedure(s) Performed: Irrigation and debridement of lumbar wound (Back)     Patient location during evaluation: PACU Anesthesia Type: General Level of consciousness: awake and alert Pain management: pain level controlled Vital Signs Assessment: post-procedure vital signs reviewed and stable Respiratory status: spontaneous breathing, nonlabored ventilation, respiratory function stable and patient connected to nasal cannula oxygen Cardiovascular status: blood pressure returned to baseline Postop Assessment: no apparent nausea or vomiting Anesthetic complications: no Comments: Post-op patient developed Afib/AF.  Patient asymptomatic, no prior cardiac issues.  Discussed with surgeon, cardiology consult placed for further management.   No notable events documented.  Last Vitals:  Vitals:   09/22/23 2000 09/22/23 2015  BP: 99/75 102/71  Pulse: (!) 136 (!) 137  Resp: 12 20  Temp:    SpO2: 95% 97%    Last Pain:  Vitals:   09/22/23 2015  TempSrc:   PainSc: 2                  Collene Schlichter

## 2023-09-22 NOTE — Anesthesia Preprocedure Evaluation (Addendum)
 Anesthesia Evaluation  Patient identified by MRN, date of birth, ID band Patient awake    Reviewed: Allergy & Precautions, NPO status , Patient's Chart, lab work & pertinent test results  Airway Mallampati: II  TM Distance: >3 FB Neck ROM: Full    Dental  (+) Chipped,    Pulmonary    Pulmonary exam normal        Cardiovascular negative cardio ROS Normal cardiovascular exam     Neuro/Psych  Headaches  Neuromuscular disease  negative psych ROS   GI/Hepatic Neg liver ROS,GERD  Medicated and Controlled,,  Endo/Other  negative endocrine ROS    Renal/GU negative Renal ROS     Musculoskeletal  (+) Arthritis ,    Abdominal  (+) + obese  Peds  Hematology negative hematology ROS (+)   Anesthesia Other Findings Lumbar wound  Reproductive/Obstetrics                             Anesthesia Physical Anesthesia Plan  ASA: 2  Anesthesia Plan: General   Post-op Pain Management: Tylenol PO (pre-op)* and Toradol IV (intra-op)*   Induction: Intravenous  PONV Risk Score and Plan: 3 and Ondansetron, Dexamethasone, Midazolam and Treatment may vary due to age or medical condition  Airway Management Planned: Oral ETT  Additional Equipment: None  Intra-op Plan:   Post-operative Plan: Extubation in OR  Informed Consent: I have reviewed the patients History and Physical, chart, labs and discussed the procedure including the risks, benefits and alternatives for the proposed anesthesia with the patient or authorized representative who has indicated his/her understanding and acceptance.     Dental advisory given  Plan Discussed with: CRNA  Anesthesia Plan Comments:        Anesthesia Quick Evaluation

## 2023-09-23 ENCOUNTER — Inpatient Hospital Stay (HOSPITAL_COMMUNITY): Payer: 59

## 2023-09-23 ENCOUNTER — Encounter (HOSPITAL_COMMUNITY): Payer: Self-pay | Admitting: Neurological Surgery

## 2023-09-23 DIAGNOSIS — T8149XA Infection following a procedure, other surgical site, initial encounter: Secondary | ICD-10-CM | POA: Diagnosis not present

## 2023-09-23 DIAGNOSIS — I4891 Unspecified atrial fibrillation: Secondary | ICD-10-CM

## 2023-09-23 LAB — ECHOCARDIOGRAM COMPLETE
AR max vel: 1.89 cm2
AV Peak grad: 12.1 mmHg
Ao pk vel: 1.74 m/s
Area-P 1/2: 4.39 cm2
Height: 68 in
S' Lateral: 3.2 cm
Weight: 3616 [oz_av]

## 2023-09-23 LAB — BASIC METABOLIC PANEL
Anion gap: 7 (ref 5–15)
BUN: 11 mg/dL (ref 6–20)
CO2: 25 mmol/L (ref 22–32)
Calcium: 8 mg/dL — ABNORMAL LOW (ref 8.9–10.3)
Chloride: 102 mmol/L (ref 98–111)
Creatinine, Ser: 0.66 mg/dL (ref 0.44–1.00)
GFR, Estimated: >60 mL/min (ref >60)
Glucose, Bld: 103 mg/dL — ABNORMAL HIGH (ref 70–99)
Potassium: 3.2 mmol/L — ABNORMAL LOW (ref 3.5–5.1)
Sodium: 134 mmol/L — ABNORMAL LOW (ref 135–145)

## 2023-09-23 MED ORDER — ACYCLOVIR 5 % EX OINT
TOPICAL_OINTMENT | Freq: Four times a day (QID) | CUTANEOUS | Status: DC
  Administered 2023-09-24: 1 via TOPICAL
  Filled 2023-09-23: qty 5
  Filled 2023-09-23: qty 15
  Filled 2023-09-23: qty 5

## 2023-09-23 MED ORDER — VANCOMYCIN HCL 1750 MG/350ML IV SOLN
1750.0000 mg | INTRAVENOUS | Status: DC
  Administered 2023-09-23 – 2023-09-24 (×2): 1750 mg via INTRAVENOUS
  Filled 2023-09-23 (×2): qty 350

## 2023-09-23 NOTE — Plan of Care (Signed)
  Problem: Clinical Measurements: Goal: Respiratory complications will improve Outcome: Progressing Goal: Cardiovascular complication will be avoided Outcome: Progressing   Problem: Clinical Measurements: Goal: Cardiovascular complication will be avoided Outcome: Progressing   Problem: Activity: Goal: Risk for activity intolerance will decrease Outcome: Progressing   Problem: Nutrition: Goal: Adequate nutrition will be maintained Outcome: Progressing   Problem: Elimination: Goal: Will not experience complications related to urinary retention Outcome: Progressing   Problem: Pain Managment: Goal: General experience of comfort will improve and/or be controlled Outcome: Progressing

## 2023-09-23 NOTE — Progress Notes (Signed)
 Progress Note  Patient Name: Dawn Mcguire Date of Encounter: 09/23/2023  Primary Cardiologist:   None   Subjective   No palpitations or SOB.    Inpatient Medications    Scheduled Meds:  vitamin C  1,000 mg Oral Daily   pantoprazole  20 mg Oral Daily   senna  1 tablet Oral BID   sodium chloride flush  3 mL Intravenous Q12H   zinc sulfate (50mg  elemental zinc)  220 mg Oral Daily   Continuous Infusions:  sodium chloride 1 mL/hr at 09/23/23 0227   0.9 % NaCl with KCl 20 mEq / L 75 mL/hr at 09/23/23 0227   cefTRIAXone (ROCEPHIN)  IV Stopped (09/22/23 2314)   diltiazem (CARDIZEM) infusion 2.5 mg/hr (09/23/23 0227)   vancomycin 1,250 mg (09/23/23 0600)   PRN Meds: acetaminophen **OR** acetaminophen, cyclobenzaprine, menthol-cetylpyridinium **OR** phenol, morphine injection, ondansetron **OR** ondansetron (ZOFRAN) IV, oxyCODONE-acetaminophen, sodium chloride flush   Vital Signs    Vitals:   09/22/23 2108 09/22/23 2344 09/23/23 0350 09/23/23 0803  BP: 117/87 97/62 108/74 119/70  Pulse: (!) 141 74 81 79  Resp: 20 16 18 18   Temp: 98 F (36.7 C) 97.7 F (36.5 C) 97.8 F (36.6 C) 97.9 F (36.6 C)  TempSrc: Oral Oral Oral Oral  SpO2: 100% 99% 98% 100%  Weight:      Height:        Intake/Output Summary (Last 24 hours) at 09/23/2023 0838 Last data filed at 09/23/2023 0227 Gross per 24 hour  Intake 1469.15 ml  Output 100 ml  Net 1369.15 ml   Filed Weights   09/22/23 1613  Weight: 102.5 kg    Telemetry    NSR - Personally Reviewed  ECG    NA - Personally Reviewed  Physical Exam   GEN: No acute distress.   Neck: No  JVD Cardiac: RRR, no murmurs, rubs, or gallops.  Respiratory: Clear  to auscultation bilaterally. GI: Soft, nontender, non-distended  MS: No  edema; No deformity. Neuro:  Nonfocal  Psych: Normal affect   Labs    ChemistryNo results for input(s): "NA", "K", "CL", "CO2", "GLUCOSE", "BUN", "CREATININE", "CALCIUM", "PROT", "ALBUMIN",  "AST", "ALT", "ALKPHOS", "BILITOT", "GFRNONAA", "GFRAA", "ANIONGAP" in the last 168 hours.   Hematology Recent Labs  Lab 09/22/23 1605  WBC 22.3*  RBC 4.26  HGB 13.1  HCT 38.2  MCV 89.7  MCH 30.8  MCHC 34.3  RDW 12.6  PLT 330    Cardiac EnzymesNo results for input(s): "TROPONINI" in the last 168 hours. No results for input(s): "TROPIPOC" in the last 168 hours.   BNPNo results for input(s): "BNP", "PROBNP" in the last 168 hours.   DDimer No results for input(s): "DDIMER" in the last 168 hours.   Radiology    No results found.  Cardiac Studies   Echo pending.   Patient Profile     58 y.o. female with no significant past medical history other than back issue who is being seen 09/22/2023 for the evaluation of new onset of atrial fibrillation with RVR at the request of Dr. Bradley Ferris.   Assessment & Plan    Atrial fib:   Echo pending.   OK to discharge from our standpoint.  If the echo is not complete we can schedule as an out patient.  No change in therapy.   I am not planning an out patient monitor.  Instead I discussed with her buying an Apple Watch.  Please include my name and she can contact me in  the future through MyChart if she has any further palpitations or questions.  Dawn Mcguire has a CHA2DS2 - VASc score of 1 puts her at very low thromboembolic risk.    For questions or updates, please contact [facility] Please consult [email or web address] for contact info under Cardiology/STEMI.   Signed, Rollene Rotunda, MD  09/23/2023, 8:38 AM

## 2023-09-23 NOTE — Progress Notes (Signed)
 Pharmacy Antibiotic Note  Dawn Mcguire is a 58 y.o. female admitted on 09/22/2023 with lumbar wound s/p I&D and wound VAC placement.  Pharmacy has been consulted for vancomycin dosing.  WBC 22.3, SCr 0.66  2/24 lumbar wound: rare GPC 2/25 BCx: pending  Plan: Adjust Vancomycin dose to 1750mg  IV q24h (SCr 0.8, Vd 0.5, eAUC 509)  Height: 5\' 8"  (172.7 cm) Weight: 102.5 kg (226 lb) IBW/kg (Calculated) : 63.9  Temp (24hrs), Avg:98.3 F (36.8 C), Min:97.7 F (36.5 C), Max:99.9 F (37.7 C)  Recent Labs  Lab 09/22/23 1605 09/23/23 1007  WBC 22.3*  --   CREATININE  --  0.66    Estimated Creatinine Clearance: 93.6 mL/min (by C-G formula based on SCr of 0.66 mg/dL).    Allergies  Allergen Reactions   Latex Rash    Tears skin and break out   Sulfa Antibiotics Itching and Nausea And Vomiting    Skin becomes red and feels like it is burning   Sulfasalazine Hives, Itching and Nausea And Vomiting    Skin becomes red and feels like it is burning    Tape     Tears skin. Patient prefers paper tape    Corticosteroids Hives, Itching and Rash    fever   Nitrofurantoin Monohyd Macro Nausea And Vomiting   Penicillins Hives, Itching and Rash            Thank you for allowing pharmacy to be a part of this patient's care.  [employer], Pharm.D., BCPS Clinical Pharmacist  **Pharmacist phone directory can be found on [email or web address] listed under [facility].  09/23/2023 12:29 PM

## 2023-09-23 NOTE — Plan of Care (Signed)
 Ambulated in the hallway and said she felt really good walking.  Husband at her side along with this nurse.     Problem: Clinical Measurements: Goal: Will remain free from infection Outcome: Progressing   Problem: Clinical Measurements: Goal: Diagnostic test results will improve Outcome: Progressing   Problem: Clinical Measurements: Goal: Respiratory complications will improve Outcome: Progressing   Problem: Activity: Goal: Risk for activity intolerance will decrease Outcome: Progressing   Problem: Clinical Measurements: Goal: Respiratory complications will improve Outcome: Progressing   Problem: Coping: Goal: Level of anxiety will decrease Outcome: Progressing   Problem: Pain Managment: Goal: General experience of comfort will improve and/or be controlled Outcome: Progressing

## 2023-09-23 NOTE — Progress Notes (Signed)
 Subjective: Patient reports doing well, back pain is better  Objective: Vital signs in last 24 hours: Temp:  [97.7 F (36.5 C)-99.9 F (37.7 C)] 97.9 F (36.6 C) (02/25 0803) Pulse Rate:  [74-141] 79 (02/25 0803) Resp:  [10-22] 18 (02/25 0803) BP: (97-150)/(62-102) 119/70 (02/25 0803) SpO2:  [95 %-100 %] 100 % (02/25 0803) Weight:  [102.5 kg] 102.5 kg (02/24 1613)  Intake/Output from previous day: 02/24 0701 - 02/25 0700 In: 1469.2 [I.V.:1019.2; IV Piggyback:450] Out: 100 [Blood:100] Intake/Output this shift: No intake/output data recorded.  Neurologic: Grossly normal  Lab Results: Lab Results  Component Value Date   WBC 22.3 (H) 09/22/2023   HGB 13.1 09/22/2023   HCT 38.2 09/22/2023   MCV 89.7 09/22/2023   PLT 330 09/22/2023   Lab Results  Component Value Date   INR 0.9 08/26/2023   BMET Lab Results  Component Value Date   NA 138 08/26/2023   K 4.1 08/26/2023   CL 103 08/26/2023   CO2 25 08/26/2023   GLUCOSE 100 (H) 08/26/2023   BUN 14 08/26/2023   CREATININE 0.71 08/26/2023   CALCIUM 9.5 08/26/2023    Studies/Results: No results found.  Assessment/Plan: S/p I&D of lumbar wound. Will order blood cultures today in anticipation of PICC line placement. Please ambulate patient in the hallway 3x today. She does not need to use the bedpain.    LOS: 1 day    Tiana Loft Geddy [place] 09/23/2023, 8:10 AM

## 2023-09-23 NOTE — Progress Notes (Signed)
 Echocardiogram 2D Echocardiogram has been performed.  Dawn Mcguire 09/23/2023, 4:13 PM

## 2023-09-23 NOTE — TOC Initial Note (Signed)
 Transition of Care [facility]) - Initial/Assessment Note    Patient Details  Name: Dawn Mcguire MRN: [id number] Date of Birth: 1965-12-16  [facility]) CM/SW Contact:    Kermit Balo, RN Phone Number: 09/23/2023, 2:10 PM  Clinical Narrative:                  Pt is from home with her spouse. Spouse works part time (am until about 2:00-2:30pm).  No issues with home medications or transportation. Pt with prevena wound vac.  She is s/p I&D and will require home IV abx. Pt without a preference on abx company. CM has sent referral to [facility] with [employer]. Pam will also have [employer] cover the [facility]. Pam will educate the patient on administration and pts spouse.  Pt will need PICC after BC negative and prior to d/c home.  No current DME needs.  TOC following.  Expected Discharge Plan: Home w Home Health Services Barriers to Discharge: Continued Medical Work up   Patient Goals and CMS Choice     Choice offered to / list presented to : Patient      Expected Discharge Plan and Services   Discharge Planning Services: CM Consult   Living arrangements for the past 2 months: Single Family Home                           HH Arranged: RN [facility] Agency: [employer] Date HH Agency Contacted: 09/23/23   Representative spoke with at [facility] Agency: Pam  Prior Living Arrangements/Services Living arrangements for the past 2 months: Single Family Home Lives with:: Spouse Patient language and need for interpreter reviewed:: Yes Do you feel safe going back to the place where you live?: Yes        Care giver support system in place?: Yes (comment) Current home services: DME (crutches/ shower seat) Criminal Activity/Legal Involvement Pertinent to Current Situation/Hospitalization: No - Comment as needed  Activities of Daily Living   ADL Screening (condition at time of admission) Independently performs ADLs?: Yes (appropriate for developmental age) Is the patient deaf or have difficulty  hearing?: No Does the patient have difficulty seeing, even when wearing glasses/contacts?: No Does the patient have difficulty concentrating, remembering, or making decisions?: No  Permission Sought/Granted                  Emotional Assessment Appearance:: Appears stated age Attitude/Demeanor/Rapport: Engaged Affect (typically observed): Accepting Orientation: : Oriented to Self, Oriented to Place, Oriented to  Time, Oriented to Situation   Psych Involvement: No (comment)  Admission diagnosis:  Wound infection after surgery [T81.49XA] Patient Active Problem List   Diagnosis Date Noted   Wound infection after surgery 09/22/2023   S/P lumbar fusion 08/29/2023   Non-recurrent acute suppurative otitis media of both ears without spontaneous rupture of tympanic membranes 06/13/2023   Acute MEE (middle ear effusion), bilateral 06/13/2023   Acute swimmer's ear of both sides 06/13/2023   History of urethral stricture 04/11/2022   Joint pain 09/27/2021   Neoplasm of uncertain behavior of skin 09/27/2021   Psoriasis 09/27/2021   Thinning hair 09/27/2021   Obesity (BMI 35.0-39.9 without comorbidity) 09/04/2021   Medication refill 07/19/2021   Other headache syndrome 07/19/2021   Other fatigue 07/19/2021   Acute cough 07/19/2021   Acute cystitis without hematuria 03/14/2021   Morbid obesity (HCC) 12/08/2020   Acute non-recurrent maxillary sinusitis 10/02/2020   Anal fissure 06/07/2019   Hematuria 06/07/2019  Left foot pain 02/11/2019   Closed displaced trimalleolar fracture of right ankle 11/20/2018   Nonspecific syndrome suggestive of viral illness 11/16/2018   Bad odor of urine 06/08/2018   Acute non-recurrent frontal sinusitis 06/08/2018   Suprapubic pressure 06/08/2018   Eustachian tube dysfunction, right 06/08/2018   Drug allergy, multiple 06/08/2018   Rhus dermatitis 01/20/2018   Dysuria 07/31/2017   Frequent UTI 07/31/2017   Abdominal pain 03/31/2017   Hx  laparoscopic cholecystectomy 03/31/2017   Calculus of gallbladder without cholecystitis without obstruction 03/26/2017   Family history of colonic polyps 10/02/2016   Routine health maintenance 09/17/2012   PCP:  Mliss Sax, MD Pharmacy:   [facility], [place] - [address] [place] [address] Phone: [phone] Fax: [phone]  [id number] - [place], [place] - [address] [place] [address] Phone: [phone] Fax: [phone]  [facility] [address] [place] [address] Phone: [phone] Fax: [phone]  [id number] [place], [place] - [address] [place] [address] Phone: [phone] Fax: [phone]     Social Drivers of Health (SDOH) Social History: SDOH Screenings   Food Insecurity: No Food Insecurity (09/23/2023)  Housing: Unknown (09/23/2023)  Transportation Needs: No Transportation Needs (09/23/2023)  Utilities: Not At Risk (09/23/2023)  Depression (PHQ2-9): Low Risk  (04/11/2022)  Tobacco Use: Low Risk  (09/22/2023)   SDOH Interventions:     Readmission Risk Interventions     No data to display

## 2023-09-24 DIAGNOSIS — Z880 Allergy status to penicillin: Secondary | ICD-10-CM | POA: Diagnosis not present

## 2023-09-24 DIAGNOSIS — T8149XA Infection following a procedure, other surgical site, initial encounter: Secondary | ICD-10-CM

## 2023-09-24 DIAGNOSIS — T847XXA Infection and inflammatory reaction due to other internal orthopedic prosthetic devices, implants and grafts, initial encounter: Secondary | ICD-10-CM | POA: Diagnosis not present

## 2023-09-24 MED ORDER — RIFAMPIN 300 MG PO CAPS
300.0000 mg | ORAL_CAPSULE | Freq: Two times a day (BID) | ORAL | Status: DC
  Administered 2023-09-24 – 2023-09-26 (×5): 300 mg via ORAL
  Filled 2023-09-24 (×6): qty 1

## 2023-09-24 MED ORDER — MAGNESIUM CITRATE PO SOLN
1.0000 | Freq: Once | ORAL | Status: AC
  Administered 2023-09-24: 1 via ORAL
  Filled 2023-09-24: qty 296

## 2023-09-24 NOTE — Plan of Care (Signed)
  Problem: Education: Goal: Knowledge of General Education information will improve Description: Including pain rating scale, medication(s)/side effects and non-pharmacologic comfort measures Outcome: Progressing   Problem: Health Behavior/Discharge Planning: Goal: Ability to manage health-related needs will improve Outcome: Progressing   Problem: Clinical Measurements: Goal: Respiratory complications will improve Outcome: Progressing   Problem: Activity: Goal: Risk for activity intolerance will decrease Outcome: Progressing   Problem: Nutrition: Goal: Adequate nutrition will be maintained Outcome: Progressing   Problem: Coping: Goal: Level of anxiety will decrease Outcome: Progressing   Problem: Elimination: Goal: Will not experience complications related to urinary retention Outcome: Progressing   Problem: Skin Integrity: Goal: Risk for impaired skin integrity will decrease Outcome: Progressing

## 2023-09-24 NOTE — Plan of Care (Signed)
  Problem: Activity: Goal: Risk for activity intolerance will decrease Outcome: Progressing   Problem: Nutrition: Goal: Adequate nutrition will be maintained Outcome: Progressing   Problem: Elimination: Goal: Will not experience complications related to urinary retention Outcome: Progressing   Problem: Pain Managment: Goal: General experience of comfort will improve and/or be controlled Outcome: Progressing   Problem: Skin Integrity: Goal: Risk for impaired skin integrity will decrease Outcome: Progressing   Problem: Safety: Goal: Ability to remain free from injury will improve Outcome: Progressing

## 2023-09-24 NOTE — Progress Notes (Signed)
 Clinically she is doing really well.  She is walking in the hallways.  She has a lot less discomfort.  VAC remains in place.  She has good strength.  External wound VAC is in place with no output.  Remains on vancomycin and Rocephin for now, cultures growing abundant Staph aureus but no sensitivities are available yet.  Blood cultures are cooking.  Have spoken with infectious disease and asked them to consult on the patient.  He would likely need a PICC line and 6 weeks of IV antibiotics.

## 2023-09-24 NOTE — Progress Notes (Incomplete)
 PHARMACY CONSULT NOTE FOR:  OUTPATIENT  PARENTERAL ANTIBIOTIC THERAPY (OPAT)  Indication: MSSA Lumbar wound infection Regimen: Cefazolin 2g IV every 8 hours End date: 11/03/23 (6 weeks from OR 2/24)  IV antibiotic discharge orders are pended. To discharging provider:  please sign these orders via discharge navigator,  Select New Orders & click on the button choice - Manage This Unsigned Work.     Thank you for allowing pharmacy to be a part of this patient's care.  Georgina Pillion, PharmD, BCPS, BCIDP Infectious Diseases Clinical Pharmacist 09/24/2023 2:13 PM   **Pharmacist phone directory can now be found on [email or web address] ([email or web address]).  Listed under [facility].

## 2023-09-24 NOTE — Consult Note (Signed)
 [facility]    Date of Admission:  09/22/2023      Total days of antibiotics 22  Vancomycin   Ceftriaxone  Rifampin                Reason for Consult: Post Op Wound Infection     Referring Provider: Yetta Barre  Primary Care Provider: Mliss Sax, MD   Assessment: Dawn Mcguire is a 58 y.o. female admitted with   Post Op Lumbar Fusion Surgical Site Infection -  Hardware noted to be well affixed but seems purulence did track down. Thorough irrigation was done and cultures growing out staphylococcus aureus with susceptibilities pending.  Given this was intervened upon promptly and she has no current contraindications, she may have some benefit of adding rifampin for biofilm treatment here. Does not appear she is bacteremic from 2/25 draw - start rifampin 300 mg bid. We talked about continuing with IV treatmetn for 4-6 weeks and then conversion to oral suppressive antibiotics for minimum 6-12 months.  Counsel re: orange/red tinted secretions/urine/tears. Continue vancomycin until further susceptibilities back  OK for PICC line after 48h of negative growth from blood  -vancomycin  -OK to place PICC line tomorrow 2/27 after blood cultures negative x 2d  -rifampin 300 mg BID  -prepare for home soon -CRP / ESR in AM for therapeutic monitoring  -CBC in AM   New Onset Atrial Fibrillation -  Noted this admission and seems asymptoamtic for it. Cardiology following and planning surface echo and consideration of possible AC but with CHA2DS2 - VASc score of 1 it puts her at very low thromboembolic risk.  *if cardiology decides to put on [facility] will need to stop rifampin synergy   Vascular Access -  PICC line can be placed tomorrow after 48h clear from blood cultures   PCN Allergy -  Will need to sort out tomorrow and discuss but reaction seems non-severe with h/o rash/itching.    Plan: Continue vancomycin  Stop ceftriaxone Follow micro data to  maturity  Picc if no growth from blood cultures overnight  Post op care per Dr. Yetta Barre   Principal Problem:   Wound infection after surgery    acyclovir ointment   Topical Q6H   vitamin C  1,000 mg Oral Daily   pantoprazole  20 mg Oral Daily   senna  1 tablet Oral BID   sodium chloride flush  3 mL Intravenous Q12H   zinc sulfate (50mg  elemental zinc)  220 mg Oral Daily    HPI: Dawn Mcguire is a 58 y.o. female admitted for elective surgery to attend to infected surgical incision.   She underwent a L4-L5 fusion on 08/29/2023. She noted worsened back pain that started around 2/22 on Saturday 4 weeks after lumbar spine surgery. She stated the pain was worse and worse across low back and described aching all over. No systemic chills noted but did have a low grade fever when she came to office 2/24 for evaluation. She had some milky exudate coming from wound and decided to take back for exploration.  Following surgery she says her pain is significantly improved. She is prepared for PICC line and IV antibiotics at home. She is not on any other significant home medications. No contraceptives as she is s/p menopause.   Op note reviewed - large amount of purulent fluid observed, HW inspected and appeared well affixed.   She is noted to be in Atrial Fibrillation  and cardiology has seen her.   Review of Systems: ROS  Past Medical History:  Diagnosis Date   Arthritis    Carpal tunnel syndrome of right wrist    Closed displaced trimalleolar fracture of right ankle 11/20/2018   GERD (gastroesophageal reflux disease)    Psoriasis     Social History   Tobacco Use   Smoking status: Never   Smokeless tobacco: Never  Vaping Use   Vaping status: Never Used  Substance Use Topics   Alcohol use: No   Drug use: No    Family History  Problem Relation Age of Onset   Arthritis Mother    Heart attack Mother    High blood pressure Mother    Cancer Father    Diabetes Father    Arthritis  Brother    Arthritis Brother    Arthritis Brother    Diabetes Son    Allergies  Allergen Reactions   Latex Rash    Tears skin and break out   Sulfa Antibiotics Itching and Nausea And Vomiting    Skin becomes red and feels like it is burning   Sulfasalazine Hives, Itching and Nausea And Vomiting    Skin becomes red and feels like it is burning    Tape     Tears skin. Patient prefers paper tape    Corticosteroids Hives, Itching and Rash    fever   Nitrofurantoin Monohyd Macro Nausea And Vomiting   Penicillins Hives, Itching and Rash          OBJECTIVE: Blood pressure (P) 130/79, pulse (P) 81, temperature (P) 97.9 F (36.6 C), temperature source (P) Oral, resp. rate (P) 18, height 5\' 8"  (1.727 m), weight 102.5 kg, SpO2 (P) 100%.  Physical Exam Constitutional:      Appearance: Normal appearance. She is not ill-appearing.  HENT:     Mouth/Throat:     Mouth: Mucous membranes are moist.     Pharynx: Oropharynx is clear.  Eyes:     General: No scleral icterus. Cardiovascular:     Rate and Rhythm: Tachycardia present. Rhythm irregular.  Pulmonary:     Effort: Pulmonary effort is normal.  Skin:    General: Skin is warm and dry.     Findings: No rash.  Neurological:     Mental Status: She is oriented to person, place, and time.  Psychiatric:        Mood and Affect: Mood normal.        Thought Content: Thought content normal.     Lab Results Lab Results  Component Value Date   WBC 22.3 (H) 09/22/2023   HGB 13.1 09/22/2023   HCT 38.2 09/22/2023   MCV 89.7 09/22/2023   PLT 330 09/22/2023    Lab Results  Component Value Date   CREATININE 0.66 09/23/2023   BUN 11 09/23/2023   NA 134 (L) 09/23/2023   K 3.2 (L) 09/23/2023   CL 102 09/23/2023   CO2 25 09/23/2023    Lab Results  Component Value Date   ALT 23 09/27/2021   AST 16 09/27/2021   ALKPHOS 95 09/27/2021   BILITOT 0.6 09/27/2021     Microbiology: Recent Results (from the past 240 hours)   Aerobic/Anaerobic Culture w Gram Stain (surgical/deep wound)     Status: None (Preliminary result)   Collection Time: 09/22/23  5:27 PM   Specimen: PATH Soft tissue  Result Value Ref Range Status   Specimen Description WOUND  Final   Special Requests LUMBAR SWAB  Final   Gram Stain   Final    ABUNDANT WBC PRESENT, PREDOMINANTLY PMN RARE GRAM POSITIVE COCCI    Culture   Final    ABUNDANT STAPHYLOCOCCUS AUREUS SUSCEPTIBILITIES TO FOLLOW Performed at [facility], [address], [place], [place] [address]    Report Status PENDING  Incomplete  Culture, blood (Routine X 2) w Reflex to ID Panel     Status: None (Preliminary result)   Collection Time: 09/23/23  9:05 AM   Specimen: BLOOD  Result Value Ref Range Status   Specimen Description BLOOD RIGHT ANTECUBITAL  Final   Special Requests   Final    BOTTLES DRAWN AEROBIC AND ANAEROBIC Blood Culture results may not be optimal due to an inadequate volume of blood received in culture bottles   Culture   Final    NO GROWTH < 24 HOURS Performed at [facility], [address] [place], [place] [address]    Report Status PENDING  Incomplete  Culture, blood (Routine X 2) w Reflex to ID Panel     Status: None (Preliminary result)   Collection Time: 09/23/23  9:08 AM   Specimen: BLOOD LEFT HAND  Result Value Ref Range Status   Specimen Description BLOOD LEFT HAND  Final   Special Requests   Final    BOTTLES DRAWN AEROBIC AND ANAEROBIC Blood Culture results may not be optimal due to an inadequate volume of blood received in culture bottles   Culture   Final    NO GROWTH < 24 HOURS Performed at [facility], [address], [place], [place] [address]    Report Status PENDING  Incomplete    Rexene Alberts, MSN, NP-C [facility] Pager: [phone]  09/24/2023 10:59 AM

## 2023-09-25 ENCOUNTER — Other Ambulatory Visit: Payer: Self-pay

## 2023-09-25 ENCOUNTER — Other Ambulatory Visit (HOSPITAL_COMMUNITY): Payer: Self-pay

## 2023-09-25 DIAGNOSIS — T847XXA Infection and inflammatory reaction due to other internal orthopedic prosthetic devices, implants and grafts, initial encounter: Secondary | ICD-10-CM | POA: Diagnosis not present

## 2023-09-25 LAB — CBC WITH DIFFERENTIAL/PLATELET
Abs Immature Granulocytes: 0.03 10*3/uL (ref 0.00–0.07)
Basophils Absolute: 0 10*3/uL (ref 0.0–0.1)
Basophils Relative: 0 %
Eosinophils Absolute: 0.4 10*3/uL (ref 0.0–0.5)
Eosinophils Relative: 4 %
HCT: 33.9 % — ABNORMAL LOW (ref 36.0–46.0)
Hemoglobin: 11.3 g/dL — ABNORMAL LOW (ref 12.0–15.0)
Immature Granulocytes: 0 %
Lymphocytes Relative: 16 %
Lymphs Abs: 1.3 10*3/uL (ref 0.7–4.0)
MCH: 30.5 pg (ref 26.0–34.0)
MCHC: 33.3 g/dL (ref 30.0–36.0)
MCV: 91.6 fL (ref 80.0–100.0)
Monocytes Absolute: 0.9 10*3/uL (ref 0.1–1.0)
Monocytes Relative: 10 %
Neutro Abs: 5.7 10*3/uL (ref 1.7–7.7)
Neutrophils Relative %: 70 %
Platelets: 295 10*3/uL (ref 150–400)
RBC: 3.7 MIL/uL — ABNORMAL LOW (ref 3.87–5.11)
RDW: 12.2 % (ref 11.5–15.5)
WBC: 8.3 10*3/uL (ref 4.0–10.5)
nRBC: 0 % (ref 0.0–0.2)

## 2023-09-25 LAB — HEPATIC FUNCTION PANEL
ALT: 31 U/L (ref 0–44)
AST: 23 U/L (ref 15–41)
Albumin: 2.8 g/dL — ABNORMAL LOW (ref 3.5–5.0)
Alkaline Phosphatase: 120 U/L (ref 38–126)
Bilirubin, Direct: 0.3 mg/dL — ABNORMAL HIGH (ref 0.0–0.2)
Indirect Bilirubin: 0.5 mg/dL (ref 0.3–0.9)
Total Bilirubin: 0.8 mg/dL (ref 0.0–1.2)
Total Protein: 6.3 g/dL — ABNORMAL LOW (ref 6.5–8.1)

## 2023-09-25 LAB — C-REACTIVE PROTEIN: CRP: 10.1 mg/dL — ABNORMAL HIGH (ref <1.0)

## 2023-09-25 LAB — SEDIMENTATION RATE: Sed Rate: 92 mm/h — ABNORMAL HIGH (ref 0–22)

## 2023-09-25 MED ORDER — CEFAZOLIN IV (FOR PTA / DISCHARGE USE ONLY)
2.0000 g | Freq: Three times a day (TID) | INTRAVENOUS | 0 refills | Status: AC
Start: 2023-09-25 — End: 2023-11-03

## 2023-09-25 MED ORDER — RIFAMPIN 300 MG PO CAPS
300.0000 mg | ORAL_CAPSULE | Freq: Two times a day (BID) | ORAL | 0 refills | Status: DC
  Filled 2023-09-25: qty 60, 30d supply, fill #0
  Filled 2023-10-18: qty 18, 9d supply, fill #0

## 2023-09-25 MED ORDER — CEFAZOLIN SODIUM-DEXTROSE 2-4 GM/100ML-% IV SOLN
2.0000 g | Freq: Three times a day (TID) | INTRAVENOUS | Status: DC
  Administered 2023-09-25 – 2023-09-26 (×4): 2 g via INTRAVENOUS
  Filled 2023-09-25 (×4): qty 100

## 2023-09-25 NOTE — Progress Notes (Signed)
 Subjective: Patient reports he is doing well clinically.  Not much pain.  She is walking around well. He is doing well vital signs in last 24 hours: Temp:  [97.5 F (36.4 C)-98.2 F (36.8 C)] 97.5 F (36.4 C) (02/27 0256) Pulse Rate:  [79-98] 81 (02/27 0256) Resp:  [17-18] 17 (02/27 0256) BP: (123-147)/(73-89) 142/89 (02/27 0256) SpO2:  [96 %-100 %] 100 % (02/27 0256)  Intake/Output from previous day: 02/26 0701 - 02/27 0700 In: 480 [P.O.:480] Out: 75 [Drains:75] Intake/Output this shift: No intake/output data recorded.  Neurologic: Grossly normal  Lab Results: Lab Results  Component Value Date   WBC 8.3 09/25/2023   HGB 11.3 (L) 09/25/2023   HCT 33.9 (L) 09/25/2023   MCV 91.6 09/25/2023   PLT 295 09/25/2023   Lab Results  Component Value Date   INR 0.9 08/26/2023   BMET Lab Results  Component Value Date   NA 134 (L) 09/23/2023   K 3.2 (L) 09/23/2023   CL 102 09/23/2023   CO2 25 09/23/2023   GLUCOSE 103 (H) 09/23/2023   BUN 11 09/23/2023   CREATININE 0.66 09/23/2023   CALCIUM 8.0 (L) 09/23/2023    Studies/Results: ECHOCARDIOGRAM COMPLETE Result Date: 09/23/2023    ECHOCARDIOGRAM REPORT   Patient Name:   Dawn Mcguire Date of Exam: 09/23/2023 Medical Rec #:  [id number]        Height:       68.0 in Accession #:    [id number]       Weight:       226.0 lb Date of Birth:  1965/10/22         BSA:          2.153 m Patient Age:    58 years         BP:           119/70 mmHg Patient Gender: F                HR:           89 bpm. Exam Location:  Inpatient Procedure: 2D Echo, Cardiac Doppler and Color Doppler (Both Spectral and Color            Flow Doppler were utilized during procedure). Indications:    Atrial Fibrillation I48.91  History:        Patient has no prior history of Echocardiogram examinations.  Sonographer:    Lucendia Herrlich RCS Referring Phys: [phone] HAO MENG IMPRESSIONS  1. Left ventricular ejection fraction, by estimation, is 60 to 65%. The left ventricle has  normal function. The left ventricle has no regional wall motion abnormalities. Left ventricular diastolic parameters are consistent with Grade I diastolic dysfunction (impaired relaxation).  2. Right ventricular systolic function is normal. The right ventricular size is normal.  3. The mitral valve is normal in structure. Trivial mitral valve regurgitation. No evidence of mitral stenosis.  4. The aortic valve is tricuspid. There is mild calcification of the aortic valve. Aortic valve regurgitation is not visualized. No aortic stenosis is present.  5. The inferior vena cava is dilated in size with >50% respiratory variability, suggesting right atrial pressure of 8 mmHg. FINDINGS  Left Ventricle: Left ventricular ejection fraction, by estimation, is 60 to 65%. The left ventricle has normal function. The left ventricle has no regional wall motion abnormalities. Strain imaging was not performed. The left ventricular internal cavity  size was normal in size. There is no left ventricular hypertrophy. Left ventricular diastolic parameters are consistent with  Grade I diastolic dysfunction (impaired relaxation). Right Ventricle: The right ventricular size is normal. No increase in right ventricular wall thickness. Right ventricular systolic function is normal. Left Atrium: Left atrial size was normal in size. Right Atrium: Right atrial size was normal in size. Pericardium: There is no evidence of pericardial effusion. Mitral Valve: The mitral valve is normal in structure. Trivial mitral valve regurgitation. No evidence of mitral valve stenosis. Tricuspid Valve: The tricuspid valve is normal in structure. Tricuspid valve regurgitation is trivial. No evidence of tricuspid stenosis. Aortic Valve: The aortic valve is tricuspid. There is mild calcification of the aortic valve. Aortic valve regurgitation is not visualized. No aortic stenosis is present. Aortic valve peak gradient measures 12.1 mmHg. Pulmonic Valve: The pulmonic  valve was normal in structure. Pulmonic valve regurgitation is not visualized. No evidence of pulmonic stenosis. Aorta: The aortic root is normal in size and structure. Venous: The inferior vena cava is dilated in size with greater than 50% respiratory variability, suggesting right atrial pressure of 8 mmHg. IAS/Shunts: No atrial level shunt detected by color flow Doppler. Additional Comments: 3D imaging was not performed.  LEFT VENTRICLE PLAX 2D LVIDd:         4.50 cm   Diastology LVIDs:         3.20 cm   LV e' medial:    10.30 cm/s LV PW:         0.90 cm   LV E/e' medial:  7.0 LV IVS:        0.80 cm   LV e' lateral:   16.10 cm/s LVOT diam:     1.90 cm   LV E/e' lateral: 4.5 LV SV:         59 LV SV Index:   27 LVOT Area:     2.84 cm  RIGHT VENTRICLE             IVC RV S prime:     20.50 cm/s  IVC diam: 2.20 cm TAPSE (M-mode): 2.3 cm LEFT ATRIUM             Index        RIGHT ATRIUM           Index LA diam:        3.40 cm 1.58 cm/m   RA Area:     12.20 cm LA Vol (A2C):   55.7 ml 25.87 ml/m  RA Volume:   23.40 ml  10.87 ml/m LA Vol (A4C):   50.9 ml 23.64 ml/m LA Biplane Vol: 55.3 ml 25.69 ml/m  AORTIC VALVE AV Area (Vmax): 1.89 cm AV Vmax:        174.00 cm/s AV Peak Grad:   12.1 mmHg LVOT Vmax:      116.00 cm/s LVOT Vmean:     77.000 cm/s LVOT VTI:       0.208 m  AORTA Ao Root diam: 3.00 cm Ao Asc diam:  3.30 cm MITRAL VALVE MV Area (PHT): 4.39 cm    SHUNTS MV Decel Time: 173 msec    Systemic VTI:  0.21 m MV E velocity: 72.50 cm/s  Systemic Diam: 1.90 cm MV A velocity: 82.80 cm/s MV E/A ratio:  0.88 Arvilla Meres MD Electronically signed by Arvilla Meres MD Signature Date/Time: 09/23/2023/4:30:13 PM    Final     Assessment/Plan: She looks great clinically.  Continue negative pressure wound dressing and Hemovac for now.  Awaiting susceptibilities.  She is on vancomycin and rifampin for now.  Infectious disease  is on board.  Hopefully she will get her PICC line today and we will plan on discharge this  her final antibiotic regimen has been determined and her PICC line is in place.  Estimated body mass index is 34.36 kg/m as calculated from the following:   Height as of this encounter: 5\' 8"  (1.727 m).   Weight as of this encounter: 102.5 kg.    LOS: 3 days    Tia Alert 09/25/2023, 8:18 AM

## 2023-09-25 NOTE — Progress Notes (Signed)
 Diagnosis: Lumbar surgical site infection Hardware complicated infeciton   Culture Result: mssa  Allergies  Allergen Reactions   Latex Rash    Tears skin and break out   Sulfa Antibiotics Itching and Nausea And Vomiting    Skin becomes red and feels like it is burning   Sulfasalazine Hives, Itching and Nausea And Vomiting    Skin becomes red and feels like it is burning    Tape     Tears skin. Patient prefers paper tape    Corticosteroids Hives, Itching and Rash    fever   Nitrofurantoin Monohyd Macro Nausea And Vomiting   Penicillins Hives, Itching and Rash          OPAT Orders Discharge antibiotics to be given via PICC line Discharge antibiotics: Cefazolin  Duration: 6 weeks End Date: 11/03/23  Will need suppressive cefadroxil after the iv abx  [facility] Care Per Protocol:  Home health RN for IV administration and teaching; PICC line care and labs.    Labs weekly while on IV antibiotics: _x_ CBC with differential __ BMP _x_ CMP _x_ CRP __ ESR __ Vancomycin trough __ CK  _x_ Please pull PIC at completion of IV antibiotics __ Please leave PIC in place until doctor has seen patient or been notified  Fax weekly labs to [phone]  Clinic Follow Up Appt: 3/27 @ 930  @  [facility] [address], [place], [place] [address] Phone: [phone]

## 2023-09-25 NOTE — Progress Notes (Addendum)
 PHARMACY CONSULT NOTE FOR:  OUTPATIENT  PARENTERAL ANTIBIOTIC THERAPY (OPAT)  Indication: Lumbar fusion surgical site infection  Regimen: Cefazolin 2 g IV q8h (with rifampin 300 mg PO BID) End date: 11/03/23 (6 weeks from I&D on 09/22/23)  IV antibiotic discharge orders are pended. To discharging provider:  please sign these orders via discharge navigator,  Select New Orders & click on the button choice - Manage This Unsigned Work.    Thank you for allowing pharmacy to be a part of this patient's care.  Rutherford Nail, PharmD PGY2 Critical Care Pharmacy Resident 09/25/2023, 10:55 AM

## 2023-09-26 ENCOUNTER — Other Ambulatory Visit (HOSPITAL_COMMUNITY): Payer: Self-pay

## 2023-09-26 DIAGNOSIS — T8149XA Infection following a procedure, other surgical site, initial encounter: Secondary | ICD-10-CM | POA: Diagnosis not present

## 2023-09-26 MED ORDER — OXYCODONE-ACETAMINOPHEN 5-325 MG PO TABS
1.0000 | ORAL_TABLET | ORAL | 0 refills | Status: DC | PRN
  Filled 2023-09-26: qty 40, 7d supply, fill #0

## 2023-09-26 MED ORDER — SODIUM CHLORIDE 0.9% FLUSH: 10.0000 mL | INTRAVENOUS | Status: DC | PRN

## 2023-09-26 MED ORDER — CHLORHEXIDINE GLUCONATE CLOTH 2 % EX PADS
6.0000 | MEDICATED_PAD | Freq: Every day | CUTANEOUS | Status: DC
  Administered 2023-09-26: 6 via TOPICAL

## 2023-09-26 MED ORDER — SODIUM CHLORIDE 0.9% FLUSH
10.0000 mL | Freq: Two times a day (BID) | INTRAVENOUS | Status: DC
  Administered 2023-09-26: 10 mL

## 2023-09-26 NOTE — TOC Transition Note (Signed)
 Transition of Care [facility]) - Discharge Note   Patient Details  Name: Dawn Mcguire MRN: [id number] Date of Birth: 07-07-1966  [facility] CM/SW Contact:  Kermit Balo, RN Phone Number: 09/26/2023, 1:45 PM   Clinical Narrative:     Pt is discharging home with home health RN through [facility] that was arranged with [facility]. [facility] will deliver the IV medications to pts home. Pam with [employer] did the education for administration with the patient last pm.  Pt has transportation home.  Final next level of care: Home w Home Health Services Barriers to Discharge: No Barriers Identified   Patient Goals and CMS Choice   CMS Medicare.gov Compare Post Acute Care list provided to:: Patient Choice offered to / list presented to : Patient      Discharge Placement                       Discharge Plan and Services Additional resources added to the After Visit Summary for     Discharge Planning Services: CM Consult Post Acute Care Choice: Home Health                    HH Arranged: RN [facility] Agency: [facility] Date HH Agency Contacted: 09/23/23   Representative spoke with at [facility] Agency: Pam  Social Drivers of Health (SDOH) Interventions SDOH Screenings   Food Insecurity: No Food Insecurity (09/23/2023)  Housing: Low Risk  (09/23/2023)  Transportation Needs: No Transportation Needs (09/23/2023)  Utilities: Not At Risk (09/23/2023)  Depression (PHQ2-9): Low Risk  (04/11/2022)  Tobacco Use: Low Risk  (09/22/2023)     Readmission Risk Interventions     No data to display

## 2023-09-26 NOTE — Progress Notes (Signed)
 Peripherally Inserted Central Catheter Placement  The IV Nurse has discussed with the patient and/or persons authorized to consent for the patient, the purpose of this procedure and the potential benefits and risks involved with this procedure.  The benefits include less needle sticks, lab draws from the catheter, and the patient may be discharged home with the catheter. Risks include, but not limited to, infection, bleeding, blood clot (thrombus formation), and puncture of an artery; nerve damage and irregular heartbeat and possibility to perform a PICC exchange if needed/ordered by physician.  Alternatives to this procedure were also discussed.  Bard Power PICC patient education guide, fact sheet on infection prevention and patient information card has been provided to patient /or left at bedside.    PICC Placement Documentation  PICC Single Lumen 09/26/23 Right Brachial 39 cm 0 cm (Active)  Indication for Insertion or Continuance of Line Home intravenous therapies (PICC only) 09/26/23 1100  Exposed Catheter (cm) 0 cm 09/26/23 1100  Site Assessment Clean, Dry, Intact 09/26/23 1100  Line Status Flushed;Saline locked;Blood return noted 09/26/23 1100  Dressing Type Transparent;Securing device 09/26/23 1100  Dressing Status Antimicrobial disc/dressing in place;Clean, Dry, Intact 09/26/23 1100  Line Care Connections checked and tightened 09/26/23 1100  Line Adjustment (NICU/IV Team Only) No 09/26/23 1100  Dressing Intervention New dressing 09/26/23 1100  Dressing Change Due 10/03/23 09/26/23 1100       Franne Grip Renee 09/26/2023, 11:21 AM

## 2023-09-26 NOTE — Discharge Summary (Signed)
 Physician Discharge Summary  Patient ID: Dawn Mcguire MRN: [id number] DOB/AGE: Dec 28, 1965 58 y.o.  Admit date: 09/22/2023 Discharge date: 09/26/2023  Admission Diagnoses: Lumbar wound infection    Discharge Diagnoses: Same   Discharged Condition: good  Hospital Course: The patient was admitted on 09/22/2023 and taken to the operating room where the patient underwent irrigation and debridement of lumbar wound. The patient tolerated the procedure well and was taken to the recovery room and then to the floor in stable condition. The hospital course was routine. There were no complications. The wound remained clean dry and intact. Pt had appropriate back soreness. No complaints of leg pain or new N/T/W. The patient remained afebrile with stable vital signs, and tolerated a regular diet. The patient continued to increase activities, and pain was well controlled with oral pain medications.  Cultures grew MSSA.  Infectious disease was consulted.  She is discharged home on Ancef and rifampin with follow-up in 2 weeks.  Consults: ID  Significant Diagnostic Studies:  Results for orders placed or performed during the hospital encounter of 09/22/23  CBC per protocol   Collection Time: 09/22/23  4:05 PM  Result Value Ref Range   WBC 22.3 (H) 4.0 - 10.5 K/uL   RBC 4.26 3.87 - 5.11 MIL/uL   Hemoglobin 13.1 12.0 - 15.0 g/dL   HCT 62.1 30.8 - 65.7 %   MCV 89.7 80.0 - 100.0 fL   MCH 30.8 26.0 - 34.0 pg   MCHC 34.3 30.0 - 36.0 g/dL   RDW 84.6 96.2 - 95.2 %   Platelets 330 150 - 400 K/uL   nRBC 0.0 0.0 - 0.2 %  Aerobic/Anaerobic Culture w Gram Stain (surgical/deep wound)   Collection Time: 09/22/23  5:27 PM   Specimen: PATH Soft tissue  Result Value Ref Range   Specimen Description WOUND    Special Requests LUMBAR SWAB    Gram Stain      ABUNDANT WBC PRESENT, PREDOMINANTLY PMN RARE GRAM POSITIVE COCCI Performed at [facility], [address] [place], [place] [address]    Culture       ABUNDANT STAPHYLOCOCCUS AUREUS NO ANAEROBES ISOLATED; CULTURE IN PROGRESS FOR 5 DAYS    Report Status PENDING    Organism ID, Bacteria STAPHYLOCOCCUS AUREUS       Susceptibility   Staphylococcus aureus - MIC*    CIPROFLOXACIN <=0.5 SENSITIVE Sensitive     ERYTHROMYCIN <=0.25 SENSITIVE Sensitive     GENTAMICIN <=0.5 SENSITIVE Sensitive     OXACILLIN <=0.25 SENSITIVE Sensitive     TETRACYCLINE <=1 SENSITIVE Sensitive     VANCOMYCIN <=0.5 SENSITIVE Sensitive     TRIMETH/SULFA <=10 SENSITIVE Sensitive     CLINDAMYCIN <=0.25 SENSITIVE Sensitive     RIFAMPIN <=0.5 SENSITIVE Sensitive     Inducible Clindamycin NEGATIVE Sensitive     LINEZOLID 4 SENSITIVE Sensitive     * ABUNDANT STAPHYLOCOCCUS AUREUS  Culture, blood (Routine X 2) w Reflex to ID Panel   Collection Time: 09/23/23  9:05 AM   Specimen: BLOOD  Result Value Ref Range   Specimen Description BLOOD RIGHT ANTECUBITAL    Special Requests      BOTTLES DRAWN AEROBIC AND ANAEROBIC Blood Culture results may not be optimal due to an inadequate volume of blood received in culture bottles   Culture      NO GROWTH 3 DAYS Performed at [facility], [address] [place], [place] [address]    Report Status PENDING   Culture, blood (  Routine X 2) w Reflex to ID Panel   Collection Time: 09/23/23  9:08 AM   Specimen: BLOOD LEFT HAND  Result Value Ref Range   Specimen Description BLOOD LEFT HAND    Special Requests      BOTTLES DRAWN AEROBIC AND ANAEROBIC Blood Culture results may not be optimal due to an inadequate volume of blood received in culture bottles   Culture      NO GROWTH 3 DAYS Performed at [facility], [address], [place], [place] [address]    Report Status PENDING   Basic metabolic panel   Collection Time: 09/23/23 10:07 AM  Result Value Ref Range   Sodium 134 (L) 135 - 145 mmol/L   Potassium 3.2 (L) 3.5 - 5.1 mmol/L   Chloride 102 98 - 111 mmol/L   CO2 25 22 - 32 mmol/L   Glucose, Bld 103 (H) 70 -  99 mg/dL   BUN 11 6 - 20 mg/dL   Creatinine, Ser 7.25 0.44 - 1.00 mg/dL   Calcium 8.0 (L) 8.9 - 10.3 mg/dL   GFR, Estimated >36 >64 mL/min   Anion gap 7 5 - 15  ECHOCARDIOGRAM COMPLETE   Collection Time: 09/23/23  4:09 PM  Result Value Ref Range   Weight 3,616 oz   Height 68 in   BP 152/87 mmHg   S' Lateral 3.20 cm   AR max vel 1.89 cm2   AV Peak grad 12.1 mmHg   Ao pk vel 1.74 m/s   Area-P 1/2 4.39 cm2   Est EF 60 - 65%   CBC with Differential/Platelet   Collection Time: 09/25/23  6:48 AM  Result Value Ref Range   WBC 8.3 4.0 - 10.5 K/uL   RBC 3.70 (L) 3.87 - 5.11 MIL/uL   Hemoglobin 11.3 (L) 12.0 - 15.0 g/dL   HCT 40.3 (L) 47.4 - 25.9 %   MCV 91.6 80.0 - 100.0 fL   MCH 30.5 26.0 - 34.0 pg   MCHC 33.3 30.0 - 36.0 g/dL   RDW 56.3 87.5 - 64.3 %   Platelets 295 150 - 400 K/uL   nRBC 0.0 0.0 - 0.2 %   Neutrophils Relative % 70 %   Neutro Abs 5.7 1.7 - 7.7 K/uL   Lymphocytes Relative 16 %   Lymphs Abs 1.3 0.7 - 4.0 K/uL   Monocytes Relative 10 %   Monocytes Absolute 0.9 0.1 - 1.0 K/uL   Eosinophils Relative 4 %   Eosinophils Absolute 0.4 0.0 - 0.5 K/uL   Basophils Relative 0 %   Basophils Absolute 0.0 0.0 - 0.1 K/uL   Immature Granulocytes 0 %   Abs Immature Granulocytes 0.03 0.00 - 0.07 K/uL  Sedimentation rate   Collection Time: 09/25/23  6:48 AM  Result Value Ref Range   Sed Rate 92 (H) 0 - 22 mm/hr  C-reactive protein   Collection Time: 09/25/23  6:48 AM  Result Value Ref Range   CRP 10.1 (H) <1.0 mg/dL  Hepatic function panel   Collection Time: 09/25/23 11:13 AM  Result Value Ref Range   Total Protein 6.3 (L) 6.5 - 8.1 g/dL   Albumin 2.8 (L) 3.5 - 5.0 g/dL   AST 23 15 - 41 U/L   ALT 31 0 - 44 U/L   Alkaline Phosphatase 120 38 - 126 U/L   Total Bilirubin 0.8 0.0 - 1.2 mg/dL   Bilirubin, Direct 0.3 (H) 0.0 - 0.2 mg/dL   Indirect Bilirubin 0.5 0.3 -  0.9 mg/dL    [place] EKG SITE RITE Result Date: 09/25/2023 If [facility] image not attached, placement could not  be confirmed due to current cardiac rhythm.  ECHOCARDIOGRAM COMPLETE Result Date: 09/23/2023    ECHOCARDIOGRAM REPORT   Patient Name:   KAYTLIN BURKLOW Date of Exam: 09/23/2023 Medical Rec #:  [id number]        Height:       68.0 in Accession #:    [id number]       Weight:       226.0 lb Date of Birth:  1966-05-30         BSA:          2.153 m Patient Age:    58 years         BP:           119/70 mmHg Patient Gender: F                HR:           89 bpm. Exam Location:  Inpatient Procedure: 2D Echo, Cardiac Doppler and Color Doppler (Both Spectral and Color            Flow Doppler were utilized during procedure). Indications:    Atrial Fibrillation I48.91  History:        Patient has no prior history of Echocardiogram examinations.  Sonographer:    Lucendia Herrlich RCS Referring Phys: [phone] HAO MENG IMPRESSIONS  1. Left ventricular ejection fraction, by estimation, is 60 to 65%. The left ventricle has normal function. The left ventricle has no regional wall motion abnormalities. Left ventricular diastolic parameters are consistent with Grade I diastolic dysfunction (impaired relaxation).  2. Right ventricular systolic function is normal. The right ventricular size is normal.  3. The mitral valve is normal in structure. Trivial mitral valve regurgitation. No evidence of mitral stenosis.  4. The aortic valve is tricuspid. There is mild calcification of the aortic valve. Aortic valve regurgitation is not visualized. No aortic stenosis is present.  5. The inferior vena cava is dilated in size with >50% respiratory variability, suggesting right atrial pressure of 8 mmHg. FINDINGS  Left Ventricle: Left ventricular ejection fraction, by estimation, is 60 to 65%. The left ventricle has normal function. The left ventricle has no regional wall motion abnormalities. Strain imaging was not performed. The left ventricular internal cavity  size was normal in size. There is no left ventricular hypertrophy. Left ventricular  diastolic parameters are consistent with Grade I diastolic dysfunction (impaired relaxation). Right Ventricle: The right ventricular size is normal. No increase in right ventricular wall thickness. Right ventricular systolic function is normal. Left Atrium: Left atrial size was normal in size. Right Atrium: Right atrial size was normal in size. Pericardium: There is no evidence of pericardial effusion. Mitral Valve: The mitral valve is normal in structure. Trivial mitral valve regurgitation. No evidence of mitral valve stenosis. Tricuspid Valve: The tricuspid valve is normal in structure. Tricuspid valve regurgitation is trivial. No evidence of tricuspid stenosis. Aortic Valve: The aortic valve is tricuspid. There is mild calcification of the aortic valve. Aortic valve regurgitation is not visualized. No aortic stenosis is present. Aortic valve peak gradient measures 12.1 mmHg. Pulmonic Valve: The pulmonic valve was normal in structure. Pulmonic valve regurgitation is not visualized. No evidence of pulmonic stenosis. Aorta: The aortic root is normal in size and structure. Venous: The inferior vena cava is dilated in size with greater than 50% respiratory variability, suggesting right atrial pressure  of 8 mmHg. IAS/Shunts: No atrial level shunt detected by color flow Doppler. Additional Comments: 3D imaging was not performed.  LEFT VENTRICLE PLAX 2D LVIDd:         4.50 cm   Diastology LVIDs:         3.20 cm   LV e' medial:    10.30 cm/s LV PW:         0.90 cm   LV E/e' medial:  7.0 LV IVS:        0.80 cm   LV e' lateral:   16.10 cm/s LVOT diam:     1.90 cm   LV E/e' lateral: 4.5 LV SV:         59 LV SV Index:   27 LVOT Area:     2.84 cm  RIGHT VENTRICLE             IVC RV S prime:     20.50 cm/s  IVC diam: 2.20 cm TAPSE (M-mode): 2.3 cm LEFT ATRIUM             Index        RIGHT ATRIUM           Index LA diam:        3.40 cm 1.58 cm/m   RA Area:     12.20 cm LA Vol (A2C):   55.7 ml 25.87 ml/m  RA Volume:   23.40  ml  10.87 ml/m LA Vol (A4C):   50.9 ml 23.64 ml/m LA Biplane Vol: 55.3 ml 25.69 ml/m  AORTIC VALVE AV Area (Vmax): 1.89 cm AV Vmax:        174.00 cm/s AV Peak Grad:   12.1 mmHg LVOT Vmax:      116.00 cm/s LVOT Vmean:     77.000 cm/s LVOT VTI:       0.208 m  AORTA Ao Root diam: 3.00 cm Ao Asc diam:  3.30 cm MITRAL VALVE MV Area (PHT): 4.39 cm    SHUNTS MV Decel Time: 173 msec    Systemic VTI:  0.21 m MV E velocity: 72.50 cm/s  Systemic Diam: 1.90 cm MV A velocity: 82.80 cm/s MV E/A ratio:  0.88 Arvilla Meres MD Electronically signed by Arvilla Meres MD Signature Date/Time: 09/23/2023/4:30:13 PM    Final    DG Lumbar Spine 2-3 Views Result Date: 08/29/2023 CLINICAL DATA:  Elective surgery. EXAM: LUMBAR SPINE - 2-3 VIEW COMPARISON:  Radiograph 02/20/2023 FINDINGS: Two fluoroscopic spot views of the lumbar spine obtained in the operating room. Posterior rod with pedicle screw fusion L4-L5 with interbody spacer. Fluoroscopy time 53.5 seconds. Dose 67.892 mGy IMPRESSION: Intraoperative fluoroscopy during lumbar fusion. Electronically Signed   By: Narda Rutherford M.D.   On: 08/29/2023 15:00   DG C-Arm 1-60 Min-No Report Result Date: 08/29/2023 Fluoroscopy was utilized by the requesting physician.  No radiographic interpretation.   DG C-Arm 1-60 Min-No Report Result Date: 08/29/2023 Fluoroscopy was utilized by the requesting physician.  No radiographic interpretation.    Antibiotics:  Anti-infectives (From admission, onward)    Start     Dose/Rate Route Frequency Ordered Stop   09/25/23 1200  ceFAZolin (ANCEF) IVPB 2g/100 mL premix        2 g 200 mL/hr over 30 Minutes Intravenous Every 8 hours 09/25/23 0913     09/25/23 0000  ceFAZolin (ANCEF) IVPB        2 g Intravenous Every 8 hours 09/25/23 1505 11/03/23 2359   09/25/23 0000  rifampin (RIFADIN) 300  MG capsule        300 mg Oral 2 times daily 09/25/23 1521 11/03/23 2359   09/24/23 1200  rifampin (RIFADIN) capsule 300 mg        300 mg  Oral Every 12 hours 09/24/23 1105     09/23/23 1800  vancomycin (VANCOREADY) IVPB 1750 mg/350 mL  Status:  Discontinued        1,750 mg 175 mL/hr over 120 Minutes Intravenous Every 24 hours 09/23/23 1231 09/25/23 0912   09/23/23 0600  vancomycin (VANCOREADY) IVPB 1250 mg/250 mL  Status:  Discontinued        1,250 mg 166.7 mL/hr over 90 Minutes Intravenous Every 12 hours 09/22/23 2137 09/23/23 1231   09/22/23 2215  cefTRIAXone (ROCEPHIN) 1 g in sodium chloride 0.9 % 100 mL IVPB  Status:  Discontinued        1 g 200 mL/hr over 30 Minutes Intravenous Every 24 hours 09/22/23 2120 09/22/23 2123   09/22/23 2215  cefTRIAXone (ROCEPHIN) 2 g in sodium chloride 0.9 % 100 mL IVPB  Status:  Discontinued        2 g 200 mL/hr over 30 Minutes Intravenous Every 24 hours 09/22/23 2123 09/24/23 1057   09/22/23 1752  vancomycin (VANCOCIN) powder  Status:  Discontinued          As needed 09/22/23 1753 09/22/23 1812       Discharge Exam: Blood pressure 138/80, pulse 85, temperature 98.2 F (36.8 C), temperature source Oral, resp. rate 17, height 5\' 8"  (1.727 m), weight 102.5 kg, SpO2 100%. Neurologic: Grossly normal Dressing dry  Discharge Medications:   Allergies as of 09/26/2023       Reactions   Latex Rash   Tears skin and break out   Sulfa Antibiotics Itching, Nausea And Vomiting   Skin becomes red and feels like it is burning   Sulfasalazine Hives, Itching, Nausea And Vomiting   Skin becomes red and feels like it is burning   Tape    Tears skin. Patient prefers paper tape    Corticosteroids Hives, Itching, Rash   fever   Nitrofurantoin Monohyd Macro Nausea And Vomiting   Penicillins Hives, Itching, Rash           Medication List     TAKE these medications    acetaminophen 500 MG tablet Commonly known as: TYLENOL Take 1,000 mg by mouth 2 (two) times daily.   B-12 PO Take 1 capsule by mouth daily.   Biotin 1 MG Caps Take 1 mg by mouth daily.   ceFAZolin IVPB Commonly known  as: ANCEF Inject 2 g into the vein every 8 (eight) hours. Indication:  lumbar fusion surgical site infection First Dose: Yes Last Day of Therapy:  11/03/23 Labs - Once weekly:  CBC/D and CMP, Labs - Once weekly: ESR and CRP Method of administration: IV Push Method of administration may be changed at the discretion of home infusion pharmacist based upon assessment of the patient and/or caregiver's ability to self-administer the medication ordered.   cyclobenzaprine 5 MG tablet Commonly known as: FLEXERIL Use 1 to 2 tablets (5 to 10 mg) nightly as needed for muscle spasms. What changed:  how much to take how to take this when to take this reasons to take this additional instructions   Diflucan 100 MG tablet Generic drug: fluconazole Take by mouth.   loratadine-pseudoephedrine 5-120 MG tablet Commonly known as: CLARITIN-D 12-hour Take 1 tablet by mouth daily.   oxyCODONE-acetaminophen 5-325 MG tablet Commonly known  as: Percocet Take 1 tablet by mouth every 4 (four) hours as needed for severe pain (pain score 7-10). What changed: when to take this   PriLOSEC OTC 20 MG tablet Generic drug: omeprazole Take 20 mg by mouth daily.   pseudoephedrine 30 MG tablet Commonly known as: SUDAFED Take 30 mg by mouth daily.   rifampin 300 MG capsule Commonly known as: RIFADIN Take 1 capsule (300 mg total) by mouth 2 (two) times daily.               Discharge Care Instructions  (From admission, onward)           Start     Ordered   09/25/23 0000  Change dressing on IV access line weekly and PRN  (Home infusion instructions - [facility] )        09/25/23 1505            Disposition: Home with home health   Final Dx: Lumbar wound infection after surgery  Discharge Instructions      Remove dressing in 72 hours   Complete by: As directed    [facility] pharmacist to adjust dose for Vancomycin, Aminoglycosides and other anti-infective therapies  as requested by physician.   Complete by: As directed    [facility] to provide Cath Flo 2mg    Complete by: As directed    Administer for PICC line occlusion and as ordered by physician for other access device issues.   Anaphylaxis Kit: Provided to treat any anaphylactic reaction to the medication being provided to the patient if First Dose or when requested by physician   Complete by: As directed    Epinephrine 1mg /ml vial / amp: Administer 0.3mg  (0.23ml) subcutaneously once for moderate to severe anaphylaxis, nurse to call physician and pharmacy when reaction occurs and call 911 if needed for immediate care   Diphenhydramine 50mg /ml IV vial: Administer 25-50mg  IV/IM PRN for first dose reaction, rash, itching, mild reaction, nurse to call physician and pharmacy when reaction occurs   Sodium Chloride 0.9% NS IV: Administer if needed for hypovolemic blood pressure drop or as ordered by physician after call to physician with anaphylactic reaction   Call MD for:  difficulty breathing, headache or visual disturbances   Complete by: As directed    Call MD for:  persistant nausea and vomiting   Complete by: As directed    Call MD for:  redness, tenderness, or signs of infection (pain, swelling, redness, odor or green/yellow discharge around incision site)   Complete by: As directed    Call MD for:  severe uncontrolled pain   Complete by: As directed    Call MD for:  temperature >100.4   Complete by: As directed    Change dressing on IV access line weekly and PRN   Complete by: As directed    Diet - low sodium heart healthy   Complete by: As directed    Flush IV access with Sodium Chloride 0.9% and Heparin 10 units/ml or 100 units/ml   Complete by: As directed    Home infusion instructions - [facility]   Complete by: As directed    Instructions: Flush IV access with Sodium Chloride 0.9% and Heparin 10units/ml or 100units/ml   Change dressing on IV access line: Weekly  and PRN   Instructions Cath Flo 2mg : Administer for PICC Line occlusion and as ordered by physician for other access device   [facility] pharmacist to adjust  dose for: Vancomycin, Aminoglycosides and other anti-infective therapies as requested by physician   Increase activity slowly   Complete by: As directed    Method of administration may be changed at the discretion of home infusion pharmacist based upon assessment of the patient and/or caregiver's ability to self-administer the medication ordered   Complete by: As directed         Follow-up Information     [facility] Follow up.   Why: [phone] Providing antibiotics to the home.        [facility] Follow up.   Why: home health RN Contact information: [phone]                 Signed: Tia Alert 09/26/2023, 4:15 PM

## 2023-09-27 DIAGNOSIS — T8149XA Infection following a procedure, other surgical site, initial encounter: Secondary | ICD-10-CM | POA: Diagnosis not present

## 2023-09-27 LAB — AEROBIC/ANAEROBIC CULTURE W GRAM STAIN (SURGICAL/DEEP WOUND)

## 2023-09-28 DIAGNOSIS — T8149XA Infection following a procedure, other surgical site, initial encounter: Secondary | ICD-10-CM | POA: Diagnosis not present

## 2023-09-28 LAB — CULTURE, BLOOD (ROUTINE X 2)
Culture: NO GROWTH
Culture: NO GROWTH

## 2023-09-29 DIAGNOSIS — T8149XA Infection following a procedure, other surgical site, initial encounter: Secondary | ICD-10-CM | POA: Diagnosis not present

## 2023-09-30 DIAGNOSIS — T8149XA Infection following a procedure, other surgical site, initial encounter: Secondary | ICD-10-CM | POA: Diagnosis not present

## 2023-10-01 DIAGNOSIS — T8149XA Infection following a procedure, other surgical site, initial encounter: Secondary | ICD-10-CM | POA: Diagnosis not present

## 2023-10-02 ENCOUNTER — Telehealth: Payer: Self-pay

## 2023-10-02 DIAGNOSIS — T8149XA Infection following a procedure, other surgical site, initial encounter: Secondary | ICD-10-CM | POA: Diagnosis not present

## 2023-10-02 NOTE — Telephone Encounter (Signed)
 Patient called, says she saw Dr. Yetta Barre and he is sending her for an MRI since her surgical site is now red and swollen.   She is concerned that the infection is worsening and that her antibiotics may need to be changed.   Sandie Ano, RN

## 2023-10-02 NOTE — Telephone Encounter (Signed)
 Dr Yetta Barre  I am sorry she has ongoing issues  She is on the best combination therapy for mssa  In these cases I would highly worry the hardware might need to be removed if the infection is worse  Please let me know your thoughts Gershon Mussel

## 2023-10-03 ENCOUNTER — Other Ambulatory Visit (HOSPITAL_COMMUNITY): Payer: Self-pay | Admitting: Neurological Surgery

## 2023-10-03 ENCOUNTER — Ambulatory Visit (HOSPITAL_BASED_OUTPATIENT_CLINIC_OR_DEPARTMENT_OTHER)
Admission: RE | Admit: 2023-10-03 | Discharge: 2023-10-03 | Disposition: A | Discharge status: Home / Self Care | Source: Ambulatory Visit | Attending: Neurological Surgery | Admitting: Neurological Surgery

## 2023-10-03 DIAGNOSIS — M4316 Spondylolisthesis, lumbar region: Secondary | ICD-10-CM | POA: Diagnosis not present

## 2023-10-03 DIAGNOSIS — M48061 Spinal stenosis, lumbar region without neurogenic claudication: Secondary | ICD-10-CM | POA: Diagnosis not present

## 2023-10-03 DIAGNOSIS — T148XXA Other injury of unspecified body region, initial encounter: Secondary | ICD-10-CM | POA: Diagnosis present

## 2023-10-03 DIAGNOSIS — M5126 Other intervertebral disc displacement, lumbar region: Secondary | ICD-10-CM | POA: Diagnosis not present

## 2023-10-03 DIAGNOSIS — M51369 Other intervertebral disc degeneration, lumbar region without mention of lumbar back pain or lower extremity pain: Secondary | ICD-10-CM | POA: Diagnosis not present

## 2023-10-03 DIAGNOSIS — T8149XA Infection following a procedure, other surgical site, initial encounter: Secondary | ICD-10-CM | POA: Diagnosis not present

## 2023-10-03 MED ORDER — GADOBUTROL 1 MMOL/ML IV SOLN
7.5000 mL | Freq: Once | INTRAVENOUS | Status: AC | PRN
  Administered 2023-10-03: 7.5 mL via INTRAVENOUS
  Filled 2023-10-03: qty 7.5

## 2023-10-04 DIAGNOSIS — T8149XA Infection following a procedure, other surgical site, initial encounter: Secondary | ICD-10-CM | POA: Diagnosis not present

## 2023-10-05 DIAGNOSIS — T8149XA Infection following a procedure, other surgical site, initial encounter: Secondary | ICD-10-CM | POA: Diagnosis not present

## 2023-10-06 DIAGNOSIS — T8149XA Infection following a procedure, other surgical site, initial encounter: Secondary | ICD-10-CM | POA: Diagnosis not present

## 2023-10-07 DIAGNOSIS — T8149XA Infection following a procedure, other surgical site, initial encounter: Secondary | ICD-10-CM | POA: Diagnosis not present

## 2023-10-08 ENCOUNTER — Telehealth: Payer: Self-pay | Admitting: Family Medicine

## 2023-10-08 DIAGNOSIS — T8149XA Infection following a procedure, other surgical site, initial encounter: Secondary | ICD-10-CM | POA: Diagnosis not present

## 2023-10-08 NOTE — Telephone Encounter (Signed)
 Pt called [facility] to make an appt with Dr Doreene Burke. He is out for the rest of the week. They made an appt with another provider. We were trying to see her sooner. Her problem is, she had back surgery and has gotten an infection at the surgery site. She has a port in with medication. She is having a spike in her blood pressure. Today it is 167/99. She has an appt with her neurosurgeon tomorrow 3/13 in the am. We asked Morrie Sheldon if she would see her.

## 2023-10-09 DIAGNOSIS — T8149XA Infection following a procedure, other surgical site, initial encounter: Secondary | ICD-10-CM | POA: Diagnosis not present

## 2023-10-09 DIAGNOSIS — M4316 Spondylolisthesis, lumbar region: Secondary | ICD-10-CM | POA: Diagnosis not present

## 2023-10-09 DIAGNOSIS — T148XXA Other injury of unspecified body region, initial encounter: Secondary | ICD-10-CM | POA: Diagnosis not present

## 2023-10-10 ENCOUNTER — Ambulatory Visit: Admitting: Family Medicine

## 2023-10-10 DIAGNOSIS — T8149XA Infection following a procedure, other surgical site, initial encounter: Secondary | ICD-10-CM | POA: Diagnosis not present

## 2023-10-11 DIAGNOSIS — T8149XA Infection following a procedure, other surgical site, initial encounter: Secondary | ICD-10-CM | POA: Diagnosis not present

## 2023-10-14 ENCOUNTER — Ambulatory Visit (INDEPENDENT_AMBULATORY_CARE_PROVIDER_SITE_OTHER): Admitting: Family Medicine

## 2023-10-14 ENCOUNTER — Encounter: Payer: Self-pay | Admitting: Family Medicine

## 2023-10-14 VITALS — BP 144/84 | HR 74 | Temp 97.5°F | Wt 225.4 lb

## 2023-10-14 DIAGNOSIS — T8144XS Sepsis following a procedure, sequela: Secondary | ICD-10-CM | POA: Diagnosis not present

## 2023-10-14 DIAGNOSIS — I10 Essential (primary) hypertension: Secondary | ICD-10-CM

## 2023-10-14 MED ORDER — AMLODIPINE BESYLATE 2.5 MG PO TABS: 2.5000 mg | ORAL_TABLET | Freq: Every day | ORAL | 0 refills | Status: DC

## 2023-10-14 NOTE — Patient Instructions (Signed)
 VISIT SUMMARY:  Dawn Mcguire, a 58 year old female with hypertension, visited today due to elevated blood pressure readings and associated symptoms. She has experienced fluctuating blood pressure, particularly after administering her antibiotic infusion through a PICC line, and has symptoms such as dizziness, headaches, shortness of breath, and hot flashes. She recently underwent back surgery and developed a staph infection, requiring treatment with cefazolin. Her blood pressure readings have varied, and she has been experiencing fluid buildup and significant stress from her recent surgery and infection.  YOUR PLAN:  -HYPERTENSION: Hypertension, or high blood pressure, means that the force of the blood against your artery walls is too high, which can lead to health problems. Your blood pressure readings have been elevated, and you have experienced symptoms like dizziness, headaches, and hot flashes. We discussed the importance of consistent blood pressure monitoring and starting antihypertensive medication. You will begin taking amlodipine 2.5 mg daily. Please monitor your blood pressure at home, ideally in the morning and evening, and bring your home blood pressure cuff to the next appointment for calibration. We will reassess your blood pressure and medication efficacy in one month.  -POSTOPERATIVE STAPH INFECTION: A staph infection is caused by bacteria and can occur after surgery. You developed this infection following your back surgery and are currently being treated with cefazolin through a PICC line. Continue your cefazolin treatment as prescribed by your infectious disease specialist, Dr. Darlyn Chamber, and follow up with him on your scheduled appointment next Thursday.  -GENERAL HEALTH MAINTENANCE: You have made positive lifestyle changes, including losing nearly 30 pounds since October. Continue with these healthy changes, such as weight management and dietary modifications like the Mediterranean  or DASH diet, to help manage your blood pressure and overall health.  INSTRUCTIONS:  Please monitor your blood pressure at home, ideally in the morning and evening, and bring your home blood pressure cuff to the next appointment for calibration. Continue your cefazolin treatment as prescribed and follow up with your infectious disease specialist, Dr. Darlyn Chamber, next Thursday. Schedule a follow-up appointment in one month to reassess your blood pressure and medication efficacy.

## 2023-10-14 NOTE — Progress Notes (Signed)
 Assessment/Plan:   Assessment and Plan    Hypertension Blood pressure readings have been elevated at home, with a recent reading of 184/119 mmHg. In the clinic, blood pressure was 142/86 mmHg, which is slightly elevated. She has experienced symptoms such as dizziness, headaches, and hot flashes, potentially related to blood pressure fluctuations. Significant stress from recent back surgery and a subsequent staph infection may contribute to blood pressure variability. The antibiotic cefazolin, administered via PICC line, is not known to cause hypertension. Discussed the importance of consistent blood pressure monitoring and the potential benefits of starting antihypertensive medication. Amlodipine 2.5 mg was chosen for its mild effect and low side effect profile, with the flexibility to adjust treatment based on future blood pressure readings and overall health improvement. - Prescribe amlodipine 2.5 mg daily. - Provide printout on proper blood pressure measurement techniques. - Instruct to monitor blood pressure at home, ideally in the morning and evening, and bring the home blood pressure cuff to the next appointment for calibration. - Schedule follow-up appointment in one month to reassess blood pressure and medication efficacy.  Postoperative Staph Infection Developed a staph infection following back surgery on August 29, 2023, to correct broken vertebrae. The infection required treatment with cefazolin administered via a PICC line. She is under the care of an infectious disease specialist and has a follow-up appointment scheduled. - Continue cefazolin treatment as prescribed by the infectious disease specialist. - Follow up with infectious disease specialist, Dr. Darlyn Chamber, on the scheduled appointment next Thursday.  General Health Maintenance Has lost nearly 30 pounds since October, which is a positive lifestyle change. Emphasized the importance of lifestyle modifications, such as a  Mediterranean or DASH diet, to help manage blood pressure and overall health. - Encourage continuation of healthy lifestyle changes, including weight management and dietary modifications.         Medications Discontinued During This Encounter  Medication Reason   DIFLUCAN 100 MG tablet    loratadine-pseudoephedrine (CLARITIN-D 12-HOUR) 5-120 MG tablet    omeprazole (PRILOSEC OTC) 20 MG tablet    oxyCODONE-acetaminophen (PERCOCET) 5-325 MG tablet    pseudoephedrine (SUDAFED) 30 MG tablet     Return in about 1 month (around 11/14/2023) for BP.    Subjective:   Encounter date: 10/14/2023  Dawn Mcguire is a 58 y.o. female who has Rhus dermatitis; Bad odor of urine; Acute non-recurrent frontal sinusitis; Suprapubic pressure; Eustachian tube dysfunction, right; Drug allergy, multiple; Nonspecific syndrome suggestive of viral illness; Closed displaced trimalleolar fracture of right ankle; Left foot pain; Anal fissure; Hematuria; Acute non-recurrent maxillary sinusitis; Morbid obesity (HCC); Acute cystitis without hematuria; Medication refill; Other headache syndrome; Other fatigue; Acute cough; Obesity (BMI 35.0-39.9 without comorbidity); Abdominal pain; Calculus of gallbladder without cholecystitis without obstruction; Dysuria; Family history of colonic polyps; Hx laparoscopic cholecystectomy; Joint pain; Neoplasm of uncertain behavior of skin; Psoriasis; Routine health maintenance; Thinning hair; Frequent UTI; History of urethral stricture; Non-recurrent acute suppurative otitis media of both ears without spontaneous rupture of tympanic membranes; Acute MEE (middle ear effusion), bilateral; Acute swimmer's ear of both sides; S/P lumbar fusion; Wound infection after surgery; and Hardware complicating wound infection (HCC) on their problem list..   She  has a past medical history of Arthritis, Carpal tunnel syndrome of right wrist, Closed displaced trimalleolar fracture of right ankle  (11/20/2018), GERD (gastroesophageal reflux disease), and Psoriasis..   She presents with chief complaint of Medical Management of Chronic Issues (Blood pressure has been elevated since pick line of antibiotics. Runs  138/97 and 160's/90s) .   Discussed the use of AI scribe software for clinical note transcription with the patient, who gave verbal consent to proceed.  History of Present Illness   Dawn Mcguire is a 58 year old female with hypertension who presents with elevated blood pressure readings and associated symptoms.  She experiences elevated blood pressure readings, particularly after administering her antibiotic infusion through a PICC line. Her blood pressure tends to fluctuate, with a recent reading of 184/119 mmHg. Associated symptoms include dizziness, headaches, shortness of breath, and hot flashes, which she correlates with elevated blood pressure.  She underwent back surgery on January 31st to correct broken vertebrae and subsequently developed a staph infection, requiring treatment with cefazolin via a PICC line. She experienced tachycardia twice during surgery, with blood pressure reaching high levels, necessitating medication to lower it at that time.  Her blood pressure readings have varied, with measurements of 139/83 mmHg on January 31st, 138/80 mmHg on February 28th, and 142/86 mmHg on March 18th. She notes that her blood pressure tends to be higher at home, with a recent reading of 179/109 mmHg taken by a nurse.  She has been experiencing fluid buildup, which she believes has contributed to her elevated blood pressure due to associated pain. She has lost nearly 30 pounds since October, primarily through lifestyle changes, and drinks mostly water with minimal caffeine intake.       ROS  Past Surgical History:  Procedure Laterality Date   ABDOMINAL HYSTERECTOMY     BLADDER REPAIR     BREAST SURGERY     CARPAL TUNNEL RELEASE     on the right wrist    CHOLECYSTECTOMY     INSERTION OF MESH N/A 10/16/2017   Procedure: INSERTION OF MESH;  Surgeon: Manus Rudd, MD;  Location: [facility] OR;  Service: General;  Laterality: N/A;   IR US GUIDE BX ASP/DRAIN  12/31/2017   LUMBAR WOUND DEBRIDEMENT N/A 09/22/2023   Procedure: Irrigation and debridement of lumbar wound;  Surgeon: Arman Bogus, MD;  Location: [facility] OR;  Service: Neurosurgery;  Laterality: N/A;   ORIF ANKLE FRACTURE Right 11/20/2018   Procedure: OPEN REDUCTION INTERNAL FIXATION (ORIF) ANKLE FRACTURE AND SYNDESMOSIS;  Surgeon: Teryl Lucy, MD;  Location: [facility];  Service: Orthopedics;  Laterality: Right;   POSTERIOR FUSION PEDICLE SCREW PLACEMENT N/A 08/29/2023   Procedure: PLIF - L4-L5 - Posterior Lateral and Interbody fusion;  Surgeon: Arman Bogus, MD;  Location: [facility] OR;  Service: Neurosurgery;  Laterality: N/A;   VENTRAL HERNIA REPAIR N/A 10/16/2017   Procedure: OPEN VENTRAL HERNIA REPAIR WITH MESH;  Surgeon: Manus Rudd, MD;  Location: [facility] OR;  Service: General;  Laterality: N/A;    Outpatient Medications Prior to Visit  Medication Sig Dispense Refill   acetaminophen (TYLENOL) 500 MG tablet Take 1,000 mg by mouth 2 (two) times daily.     Biotin 1 MG CAPS Take 1 mg by mouth daily.     ceFAZolin (ANCEF) IVPB Inject 2 g into the vein every 8 (eight) hours. Indication:  lumbar fusion surgical site infection First Dose: Yes Last Day of Therapy:  11/03/23 Labs - Once weekly:  CBC/D and CMP, Labs - Once weekly: ESR and CRP Method of administration: IV Push Method of administration may be changed at the discretion of home infusion pharmacist based upon assessment of the patient and/or caregiver's ability to self-administer the medication ordered. 120 Units 0   Cyanocobalamin (B-12 PO) Take 1 capsule by mouth daily.  cyclobenzaprine (FLEXERIL) 5 MG tablet Use 1 to 2 tablets (5 to 10 mg) nightly as needed for muscle spasms. 30 tablet 0   rifampin (RIFADIN) 300 MG  capsule Take 1 capsule (300 mg total) by mouth 2 (two) times daily. 78 capsule 0   DIFLUCAN 100 MG tablet Take by mouth. (Patient not taking: Reported on 09/23/2023)     loratadine-pseudoephedrine (CLARITIN-D 12-HOUR) 5-120 MG tablet Take 1 tablet by mouth daily. (Patient not taking: Reported on 10/14/2023)     omeprazole (PRILOSEC OTC) 20 MG tablet Take 20 mg by mouth daily. (Patient not taking: Reported on 10/14/2023)     oxyCODONE-acetaminophen (PERCOCET) 5-325 MG tablet Take 1 tablet by mouth every 4 (four) hours as needed for severe pain (pain score 7-10). (Patient not taking: Reported on 10/14/2023) 40 tablet 0   pseudoephedrine (SUDAFED) 30 MG tablet Take 30 mg by mouth daily. (Patient not taking: Reported on 10/14/2023)     No facility-administered medications prior to visit.    Family History  Problem Relation Age of Onset   Arthritis Mother    Heart attack Mother    High blood pressure Mother    Cancer Father    Diabetes Father    Arthritis Brother    Arthritis Brother    Arthritis Brother    Diabetes Son     Social History   Socioeconomic History   Marital status: Married    Spouse name: Not on file   Number of children: Not on file   Years of education: Not on file   Highest education level: Not on file  Occupational History   Not on file  Tobacco Use   Smoking status: Never   Smokeless tobacco: Never  Vaping Use   Vaping status: Never Used  Substance and Sexual Activity   Alcohol use: No   Drug use: No   Sexual activity: Yes    Birth control/protection: Surgical  Other Topics Concern   Not on file  Social History Narrative   Not on file   Social Drivers of Health   Financial Resource Strain: Not on file  Food Insecurity: No Food Insecurity (09/23/2023)   Hunger Vital Sign    Worried About Running Out of Food in the Last Year: Never true    Ran Out of Food in the Last Year: Never true  Transportation Needs: No Transportation Needs (09/23/2023)   PRAPARE -  Administrator, Civil Service (Medical): No    Lack of Transportation (Non-Medical): No  Physical Activity: Not on file  Stress: Not on file  Social Connections: Not on file  Intimate Partner Violence: Not At Risk (09/23/2023)   Humiliation, Afraid, Rape, and Kick questionnaire    Fear of Current or Ex-Partner: No    Emotionally Abused: No    Physically Abused: No    Sexually Abused: No                                                                                                  Objective:  Physical Exam: BP (!) 144/84   Pulse 74  Temp (!) 97.5 F (36.4 C) (Temporal)   Wt 225 lb 6.4 oz (102.2 kg)   SpO2 99%   BMI 34.27 kg/m   BP Readings from Last 3 Encounters:  10/14/23 (!) 144/84  09/26/23 138/80  08/29/23 139/83     Physical Exam  MR Lumbar Spine W Wo Contrast Result Date: 10/03/2023 CLINICAL DATA:  Possible infection after surgery. Wound debridement 09/22/23. EXAM: MRI LUMBAR SPINE WITHOUT AND WITH CONTRAST TECHNIQUE: Multiplanar and multiecho pulse sequences of the lumbar spine were obtained without and with intravenous contrast. CONTRAST:  7.68mL GADAVIST GADOBUTROL 1 MMOL/ML IV SOLN COMPARISON:  MRI lumbar spine 05/25/2023. Lumbar spine radiographs 08/29/2023. FINDINGS: Segmentation: 5 non rib-bearing lumbar type vertebral bodies are present. The lowest fully formed vertebral body is L5. Alignment: Slight anterolisthesis at L4-5 was partially reduced. No other significant listhesis is present. Mild straightening of the normal lumbar lordosis is present. Vertebrae: Minimal edematous changes are present following discectomy and fusion at L4-5. Marrow signal and vertebral body heights are otherwise normal. No enhancement is associated with the T2 hypointense area at L2. Conus medullaris and cauda equina: Conus extends to the L1 level. Conus and cauda equina appear normal. Paraspinal and other soft tissues: A peripherally enhancing subcutaneous fluid collection  measures 5.8 x 7.8 x 9.4 cm at the operative site. No significant deep fluid collection is present. There is some enhancement extending into the epidural space at L4-5 and surrounding the L4 spinous process. No other significant muscle enhancement is present. Disc levels: The disc levels at L1-2 and above are normal. L2-3: Mild facet hypertrophy and disc bulging are present. No focal stenosis is present. L3-4: A mild broad-based disc protrusion is present. Moderate central canal stenosis has progressed. Mild left foraminal narrowing is similar the prior exam. L4-5: The central canal and foramina are decompressed. No residual recurrent stenosis is present. Enhancing epidural granulation tissue is noted. L5-S1: Moderate facet hypertrophy is present bilaterally. No focal disc protrusion or stenosis is present. IMPRESSION: 1. 5.8 x 7.8 x 9.4 cm peripherally enhancing subcutaneous fluid collection at the operative site likely represents a postoperative seroma. Infection is not excluded by imaging. The area could be sampled with superficial needle puncture to the right of midline. 2. Enhancing epidural granulation tissue at L4-5 and surrounding the L4 spinous process. This likely represents postoperative change. Deep infection is considered less likely. 3. Moderate central canal stenosis at L3-4 has progressed. Mild left foraminal narrowing is similar the prior exam. 4. Moderate facet hypertrophy at L5-S1 without focal disc protrusion or stenosis. 5. Mild facet hypertrophy and disc bulging at L2-3 without stenosis. Electronically Signed   By: Marin Roberts M.D.   On: 10/03/2023 18:01   [place] EKG SITE RITE Result Date: 09/25/2023 If [facility] image not attached, placement could not be confirmed due to current cardiac rhythm.  ECHOCARDIOGRAM COMPLETE Result Date: 09/23/2023    ECHOCARDIOGRAM REPORT   Patient Name:   MYLINH CRAGG Date of Exam: 09/23/2023 Medical Rec #:  [id number]        Height:       68.0 in  Accession #:    [id number]       Weight:       226.0 lb Date of Birth:  13-May-1966         BSA:          2.153 m Patient Age:    58 years         BP:  119/70 mmHg Patient Gender: F                HR:           89 bpm. Exam Location:  Inpatient Procedure: 2D Echo, Cardiac Doppler and Color Doppler (Both Spectral and Color            Flow Doppler were utilized during procedure). Indications:    Atrial Fibrillation I48.91  History:        Patient has no prior history of Echocardiogram examinations.  Sonographer:    Lucendia Herrlich RCS Referring Phys: [phone] HAO MENG IMPRESSIONS  1. Left ventricular ejection fraction, by estimation, is 60 to 65%. The left ventricle has normal function. The left ventricle has no regional wall motion abnormalities. Left ventricular diastolic parameters are consistent with Grade I diastolic dysfunction (impaired relaxation).  2. Right ventricular systolic function is normal. The right ventricular size is normal.  3. The mitral valve is normal in structure. Trivial mitral valve regurgitation. No evidence of mitral stenosis.  4. The aortic valve is tricuspid. There is mild calcification of the aortic valve. Aortic valve regurgitation is not visualized. No aortic stenosis is present.  5. The inferior vena cava is dilated in size with >50% respiratory variability, suggesting right atrial pressure of 8 mmHg. FINDINGS  Left Ventricle: Left ventricular ejection fraction, by estimation, is 60 to 65%. The left ventricle has normal function. The left ventricle has no regional wall motion abnormalities. Strain imaging was not performed. The left ventricular internal cavity  size was normal in size. There is no left ventricular hypertrophy. Left ventricular diastolic parameters are consistent with Grade I diastolic dysfunction (impaired relaxation). Right Ventricle: The right ventricular size is normal. No increase in right ventricular wall thickness. Right ventricular systolic function is  normal. Left Atrium: Left atrial size was normal in size. Right Atrium: Right atrial size was normal in size. Pericardium: There is no evidence of pericardial effusion. Mitral Valve: The mitral valve is normal in structure. Trivial mitral valve regurgitation. No evidence of mitral valve stenosis. Tricuspid Valve: The tricuspid valve is normal in structure. Tricuspid valve regurgitation is trivial. No evidence of tricuspid stenosis. Aortic Valve: The aortic valve is tricuspid. There is mild calcification of the aortic valve. Aortic valve regurgitation is not visualized. No aortic stenosis is present. Aortic valve peak gradient measures 12.1 mmHg. Pulmonic Valve: The pulmonic valve was normal in structure. Pulmonic valve regurgitation is not visualized. No evidence of pulmonic stenosis. Aorta: The aortic root is normal in size and structure. Venous: The inferior vena cava is dilated in size with greater than 50% respiratory variability, suggesting right atrial pressure of 8 mmHg. IAS/Shunts: No atrial level shunt detected by color flow Doppler. Additional Comments: 3D imaging was not performed.  LEFT VENTRICLE PLAX 2D LVIDd:         4.50 cm   Diastology LVIDs:         3.20 cm   LV e' medial:    10.30 cm/s LV PW:         0.90 cm   LV E/e' medial:  7.0 LV IVS:        0.80 cm   LV e' lateral:   16.10 cm/s LVOT diam:     1.90 cm   LV E/e' lateral: 4.5 LV SV:         59 LV SV Index:   27 LVOT Area:     2.84 cm  RIGHT VENTRICLE  IVC RV S prime:     20.50 cm/s  IVC diam: 2.20 cm TAPSE (M-mode): 2.3 cm LEFT ATRIUM             Index        RIGHT ATRIUM           Index LA diam:        3.40 cm 1.58 cm/m   RA Area:     12.20 cm LA Vol (A2C):   55.7 ml 25.87 ml/m  RA Volume:   23.40 ml  10.87 ml/m LA Vol (A4C):   50.9 ml 23.64 ml/m LA Biplane Vol: 55.3 ml 25.69 ml/m  AORTIC VALVE AV Area (Vmax): 1.89 cm AV Vmax:        174.00 cm/s AV Peak Grad:   12.1 mmHg LVOT Vmax:      116.00 cm/s LVOT Vmean:     77.000 cm/s  LVOT VTI:       0.208 m  AORTA Ao Root diam: 3.00 cm Ao Asc diam:  3.30 cm MITRAL VALVE MV Area (PHT): 4.39 cm    SHUNTS MV Decel Time: 173 msec    Systemic VTI:  0.21 m MV E velocity: 72.50 cm/s  Systemic Diam: 1.90 cm MV A velocity: 82.80 cm/s MV E/A ratio:  0.88 Arvilla Meres MD Electronically signed by Arvilla Meres MD Signature Date/Time: 09/23/2023/4:30:13 PM    Final    DG Lumbar Spine 2-3 Views Result Date: 08/29/2023 CLINICAL DATA:  Elective surgery. EXAM: LUMBAR SPINE - 2-3 VIEW COMPARISON:  Radiograph 02/20/2023 FINDINGS: Two fluoroscopic spot views of the lumbar spine obtained in the operating room. Posterior rod with pedicle screw fusion L4-L5 with interbody spacer. Fluoroscopy time 53.5 seconds. Dose 67.892 mGy IMPRESSION: Intraoperative fluoroscopy during lumbar fusion. Electronically Signed   By: Narda Rutherford M.D.   On: 08/29/2023 15:00   DG C-Arm 1-60 Min-No Report Result Date: 08/29/2023 Fluoroscopy was utilized by the requesting physician.  No radiographic interpretation.   DG C-Arm 1-60 Min-No Report Result Date: 08/29/2023 Fluoroscopy was utilized by the requesting physician.  No radiographic interpretation.    Recent Results (from the past 2160 hours)  Protime-INR     Status: None   Collection Time: 08/26/23  1:53 PM  Result Value Ref Range   Prothrombin Time 12.8 11.4 - 15.2 seconds   INR 0.9 0.8 - 1.2    Comment: (NOTE) INR goal varies based on device and disease states. Performed at [facility], [address], [place], [place] [address]   Basic metabolic panel per protocol     Status: Abnormal   Collection Time: 08/26/23  1:53 PM  Result Value Ref Range   Sodium 138 135 - 145 mmol/L   Potassium 4.1 3.5 - 5.1 mmol/L   Chloride 103 98 - 111 mmol/L   CO2 25 22 - 32 mmol/L   Glucose, Bld 100 (H) 70 - 99 mg/dL    Comment: Glucose reference range applies only to samples taken after fasting for at least 8 hours.   BUN 14 6 - 20 mg/dL   Creatinine,  Ser 6.21 0.44 - 1.00 mg/dL   Calcium 9.5 8.9 - 30.8 mg/dL   GFR, Estimated >65 >78 mL/min    Comment: (NOTE) Calculated using the CKD-EPI Creatinine Equation (2021)    Anion gap 10 5 - 15    Comment: Performed at [facility], [address] [place], [place] [address]  CBC per protocol     Status:  Abnormal   Collection Time: 08/26/23  1:53 PM  Result Value Ref Range   WBC 10.6 (H) 4.0 - 10.5 K/uL   RBC 4.91 3.87 - 5.11 MIL/uL   Hemoglobin 15.1 (H) 12.0 - 15.0 g/dL   HCT 84.6 96.2 - 95.2 %   MCV 92.5 80.0 - 100.0 fL   MCH 30.8 26.0 - 34.0 pg   MCHC 33.3 30.0 - 36.0 g/dL   RDW 84.1 32.4 - 40.1 %   Platelets 306 150 - 400 K/uL   nRBC 0.0 0.0 - 0.2 %    Comment: Performed at [facility], [address] [place], [place] [address]  Surgical pcr screen     Status: None   Collection Time: 08/26/23  1:55 PM   Specimen: Nasal Mucosa; Nasal Swab  Result Value Ref Range   MRSA, PCR NEGATIVE NEGATIVE   Staphylococcus aureus NEGATIVE NEGATIVE    Comment: (NOTE) The Xpert SA Assay (FDA approved for NASAL specimens in patients 66 years of age and older), is one component of a comprehensive surveillance program. It is not intended to diagnose infection nor to guide or monitor treatment. Performed at [facility], [address] [place], [place] [address]   Type and screen [facility]     Status: None   Collection Time: 08/26/23  2:05 PM  Result Value Ref Range   ABO/RH(D) O POS    Antibody Screen NEG    Sample Expiration 09/09/2023,2359    Extend sample reason      NO TRANSFUSIONS OR PREGNANCY IN THE PAST 3 MONTHS Performed at [facility], [address] [place], [place] [address]   ABO/Rh     Status: None   Collection Time: 08/29/23  9:20 AM  Result Value Ref Range   ABO/RH(D)      O POS Performed at [facility], [address], [place], [place] [address]   CBC per protocol     Status: Abnormal   Collection Time: 09/22/23   4:05 PM  Result Value Ref Range   WBC 22.3 (H) 4.0 - 10.5 K/uL   RBC 4.26 3.87 - 5.11 MIL/uL   Hemoglobin 13.1 12.0 - 15.0 g/dL   HCT 38.7 56.4 - 33.2 %   MCV 89.7 80.0 - 100.0 fL   MCH 30.8 26.0 - 34.0 pg   MCHC 34.3 30.0 - 36.0 g/dL   RDW 95.1 88.4 - 16.6 %   Platelets 330 150 - 400 K/uL   nRBC 0.0 0.0 - 0.2 %    Comment: Performed at [facility], [address], [place], [place] [address]  Aerobic/Anaerobic Culture w Gram Stain (surgical/deep wound)     Status: None   Collection Time: 09/22/23  5:27 PM   Specimen: PATH Soft tissue  Result Value Ref Range   Specimen Description WOUND    Special Requests LUMBAR SWAB    Gram Stain      ABUNDANT WBC PRESENT, PREDOMINANTLY PMN RARE GRAM POSITIVE COCCI    Culture      ABUNDANT STAPHYLOCOCCUS AUREUS NO ANAEROBES ISOLATED Performed at [facility], [address], [place], [place] [address]    Report Status 09/27/2023 FINAL    Organism ID, Bacteria STAPHYLOCOCCUS AUREUS       Susceptibility   Staphylococcus aureus - MIC*    CIPROFLOXACIN <=0.5 SENSITIVE Sensitive     ERYTHROMYCIN <=0.25 SENSITIVE Sensitive     GENTAMICIN <=0.5 SENSITIVE Sensitive  OXACILLIN <=0.25 SENSITIVE Sensitive     TETRACYCLINE <=1 SENSITIVE Sensitive     VANCOMYCIN <=0.5 SENSITIVE Sensitive     TRIMETH/SULFA <=10 SENSITIVE Sensitive     CLINDAMYCIN <=0.25 SENSITIVE Sensitive     RIFAMPIN <=0.5 SENSITIVE Sensitive     Inducible Clindamycin NEGATIVE Sensitive     LINEZOLID 4 SENSITIVE Sensitive     * ABUNDANT STAPHYLOCOCCUS AUREUS  Culture, blood (Routine X 2) w Reflex to ID Panel     Status: None   Collection Time: 09/23/23  9:05 AM   Specimen: BLOOD  Result Value Ref Range   Specimen Description BLOOD RIGHT ANTECUBITAL    Special Requests      BOTTLES DRAWN AEROBIC AND ANAEROBIC Blood Culture results may not be optimal due to an inadequate volume of blood received in culture bottles   Culture      NO GROWTH 5 DAYS Performed at  [facility], [address], [place], [place] [address]    Report Status 09/28/2023 FINAL   Culture, blood (Routine X 2) w Reflex to ID Panel     Status: None   Collection Time: 09/23/23  9:08 AM   Specimen: BLOOD LEFT HAND  Result Value Ref Range   Specimen Description BLOOD LEFT HAND    Special Requests      BOTTLES DRAWN AEROBIC AND ANAEROBIC Blood Culture results may not be optimal due to an inadequate volume of blood received in culture bottles   Culture      NO GROWTH 5 DAYS Performed at [facility], [address] [place], [place] [address]    Report Status 09/28/2023 FINAL   Basic metabolic panel     Status: Abnormal   Collection Time: 09/23/23 10:07 AM  Result Value Ref Range   Sodium 134 (L) 135 - 145 mmol/L   Potassium 3.2 (L) 3.5 - 5.1 mmol/L   Chloride 102 98 - 111 mmol/L   CO2 25 22 - 32 mmol/L   Glucose, Bld 103 (H) 70 - 99 mg/dL    Comment: Glucose reference range applies only to samples taken after fasting for at least 8 hours.   BUN 11 6 - 20 mg/dL   Creatinine, Ser 7.25 0.44 - 1.00 mg/dL   Calcium 8.0 (L) 8.9 - 10.3 mg/dL   GFR, Estimated >36 >64 mL/min    Comment: (NOTE) Calculated using the CKD-EPI Creatinine Equation (2021)    Anion gap 7 5 - 15    Comment: Performed at [facility], [address] [place], [place] [address]  ECHOCARDIOGRAM COMPLETE     Status: None   Collection Time: 09/23/23  4:09 PM  Result Value Ref Range   Weight 3,616 oz   Height 68 in   BP 152/87 mmHg   S' Lateral 3.20 cm   AR max vel 1.89 cm2   AV Peak grad 12.1 mmHg   Ao pk vel 1.74 m/s   Area-P 1/2 4.39 cm2   Est EF 60 - 65%   CBC with Differential/Platelet     Status: Abnormal   Collection Time: 09/25/23  6:48 AM  Result Value Ref Range   WBC 8.3 4.0 - 10.5 K/uL   RBC 3.70 (L) 3.87 - 5.11 MIL/uL   Hemoglobin 11.3 (L) 12.0 - 15.0 g/dL   HCT 42.5 (L) 95.6 - 38.7 %   MCV 91.6 80.0 - 100.0 fL   MCH 30.5 26.0 - 34.0 pg   MCHC 33.3 30.0 - 36.0  g/dL    RDW 69.6 29.5 - 28.4 %   Platelets 295 150 - 400 K/uL   nRBC 0.0 0.0 - 0.2 %   Neutrophils Relative % 70 %   Neutro Abs 5.7 1.7 - 7.7 K/uL   Lymphocytes Relative 16 %   Lymphs Abs 1.3 0.7 - 4.0 K/uL   Monocytes Relative 10 %   Monocytes Absolute 0.9 0.1 - 1.0 K/uL   Eosinophils Relative 4 %   Eosinophils Absolute 0.4 0.0 - 0.5 K/uL   Basophils Relative 0 %   Basophils Absolute 0.0 0.0 - 0.1 K/uL   Immature Granulocytes 0 %   Abs Immature Granulocytes 0.03 0.00 - 0.07 K/uL    Comment: Performed at [facility], [address], [place], [place] [address]  Sedimentation rate     Status: Abnormal   Collection Time: 09/25/23  6:48 AM  Result Value Ref Range   Sed Rate 92 (H) 0 - 22 mm/hr    Comment: Performed at [facility], [address], [place], [place] [address]  C-reactive protein     Status: Abnormal   Collection Time: 09/25/23  6:48 AM  Result Value Ref Range   CRP 10.1 (H) <1.0 mg/dL    Comment: Performed at [facility], [address], [place], [place] [address]  Hepatic function panel     Status: Abnormal   Collection Time: 09/25/23 11:13 AM  Result Value Ref Range   Total Protein 6.3 (L) 6.5 - 8.1 g/dL   Albumin 2.8 (L) 3.5 - 5.0 g/dL   AST 23 15 - 41 U/L   ALT 31 0 - 44 U/L   Alkaline Phosphatase 120 38 - 126 U/L   Total Bilirubin 0.8 0.0 - 1.2 mg/dL   Bilirubin, Direct 0.3 (H) 0.0 - 0.2 mg/dL   Indirect Bilirubin 0.5 0.3 - 0.9 mg/dL    Comment: Performed at [facility], [address], [place], [place] [address]        Garner Nash, MD, MS

## 2023-10-16 ENCOUNTER — Telehealth: Payer: Self-pay | Admitting: Family Medicine

## 2023-10-16 NOTE — Telephone Encounter (Signed)
 error

## 2023-10-18 ENCOUNTER — Other Ambulatory Visit (HOSPITAL_COMMUNITY): Payer: Self-pay

## 2023-10-18 DIAGNOSIS — T8149XA Infection following a procedure, other surgical site, initial encounter: Secondary | ICD-10-CM | POA: Diagnosis not present

## 2023-10-20 ENCOUNTER — Telehealth: Payer: Self-pay

## 2023-10-20 NOTE — Telephone Encounter (Signed)
 Patient called requesting assistance with getting her labs to be visible in MyChart. Discussed that she's getting labs through [facility] and would need to reach out to their IT department if she is having trouble logging into her MyChart account associated with them.   She also reports that her [facility] RN told her they don't have orders to draw labs today. Spoke with Lars Mage at [facility], they have orders for weekly labs through 4/7, they will ensure HH has those orders.   Patient transferred to front desk to update her insurance information.   Sandie Ano, RN

## 2023-10-22 ENCOUNTER — Telehealth: Payer: Self-pay | Admitting: Family Medicine

## 2023-10-23 ENCOUNTER — Telehealth: Payer: Self-pay

## 2023-10-23 ENCOUNTER — Ambulatory Visit: Payer: Self-pay | Admitting: Internal Medicine

## 2023-10-23 ENCOUNTER — Other Ambulatory Visit: Payer: Self-pay

## 2023-10-23 ENCOUNTER — Encounter: Payer: Self-pay | Admitting: Internal Medicine

## 2023-10-23 VITALS — BP 148/88 | HR 96 | Resp 16 | Ht 68.0 in | Wt 228.4 lb

## 2023-10-23 DIAGNOSIS — T847XXD Infection and inflammatory reaction due to other internal orthopedic prosthetic devices, implants and grafts, subsequent encounter: Secondary | ICD-10-CM

## 2023-10-23 DIAGNOSIS — M4620 Osteomyelitis of vertebra, site unspecified: Secondary | ICD-10-CM

## 2023-10-23 DIAGNOSIS — T8149XA Infection following a procedure, other surgical site, initial encounter: Secondary | ICD-10-CM | POA: Diagnosis not present

## 2023-10-23 MED ORDER — CEFADROXIL 500 MG PO CAPS: 1000.0000 mg | ORAL_CAPSULE | Freq: Two times a day (BID) | ORAL | 5 refills | Status: AC

## 2023-10-23 NOTE — Telephone Encounter (Signed)
error 

## 2023-10-23 NOTE — Patient Instructions (Signed)
 We'll have our clinic call [facility] to extend cefazolin to 11/17/23  Pick up your cefadroxil today Take 1000 mg twice a day (2 tabs twice a day) on 11/18/23  The rifampin will continue until end of 11/2023  We plan to go high dose cefadroxil for 3-6 months    Please see [place] again in around 6 weeks (sooner if any concern for wound infection not behaving)

## 2023-10-23 NOTE — Progress Notes (Signed)
 [facility]  Patient Active Problem List   Diagnosis Date Noted   Hardware complicating wound infection (HCC) 09/24/2023   Wound infection after surgery 09/22/2023   S/P lumbar fusion 08/29/2023   Non-recurrent acute suppurative otitis media of both ears without spontaneous rupture of tympanic membranes 06/13/2023   Acute MEE (middle ear effusion), bilateral 06/13/2023   Acute swimmer's ear of both sides 06/13/2023   History of urethral stricture 04/11/2022   Joint pain 09/27/2021   Neoplasm of uncertain behavior of skin 09/27/2021   Psoriasis 09/27/2021   Thinning hair 09/27/2021   Obesity (BMI 35.0-39.9 without comorbidity) 09/04/2021   Medication refill 07/19/2021   Other headache syndrome 07/19/2021   Other fatigue 07/19/2021   Acute cough 07/19/2021   Acute cystitis without hematuria 03/14/2021   Morbid obesity (HCC) 12/08/2020   Acute non-recurrent maxillary sinusitis 10/02/2020   Anal fissure 06/07/2019   Hematuria 06/07/2019   Left foot pain 02/11/2019   Closed displaced trimalleolar fracture of right ankle 11/20/2018   Nonspecific syndrome suggestive of viral illness 11/16/2018   Bad odor of urine 06/08/2018   Acute non-recurrent frontal sinusitis 06/08/2018   Suprapubic pressure 06/08/2018   Eustachian tube dysfunction, right 06/08/2018   Drug allergy, multiple 06/08/2018   Rhus dermatitis 01/20/2018   Dysuria 07/31/2017   Frequent UTI 07/31/2017   Abdominal pain 03/31/2017   Hx laparoscopic cholecystectomy 03/31/2017   Calculus of gallbladder without cholecystitis without obstruction 03/26/2017   Family history of colonic polyps 10/02/2016   Routine health maintenance 09/17/2012      Subjective:    Patient ID: Dawn Mcguire, female    DOB: 1966-03-08, 58 y.o.   MRN: [id number]  Chief Complaint  Patient presents with   Hospitalization Follow-up    HPI:  Dawn Mcguire is a 58 y.o. female here for hospital f/u  of lumbar spine fusion hardware surgical site related infection   10/23/23 id clinic first fu See a&p for more detail Wound site seroma after hospital discharge had ruptured (seroma seen on mri 3/06); pending I&D and reclosure next week Pain much better Inflammatory markers also improving Tolerating cefazolin and oral rifampin Blood pressure running a little high and started on amlodipine low dose No other complaint   Allergies  Allergen Reactions   Latex Rash    Tears skin and break out   Sulfa Antibiotics Itching and Nausea And Vomiting    Skin becomes red and feels like it is burning   Sulfasalazine Hives, Itching and Nausea And Vomiting    Skin becomes red and feels like it is burning    Tape     Tears skin. Patient prefers paper tape    Corticosteroids Hives, Itching and Rash    fever   Nitrofurantoin Monohyd Macro Nausea And Vomiting   Penicillins Hives, Itching and Rash            Outpatient Medications Prior to Visit  Medication Sig Dispense Refill   acetaminophen (TYLENOL) 500 MG tablet Take 1,000 mg by mouth 2 (two) times daily.     amLODipine (NORVASC) 2.5 MG tablet Take 1 tablet (2.5 mg total) by mouth daily. 30 tablet 0   Biotin 1 MG CAPS Take 1 mg by mouth daily.     ceFAZolin (ANCEF) IVPB Inject 2 g into the vein every 8 (eight) hours. Indication:  lumbar fusion surgical site infection First Dose: Yes Last Day of Therapy:  11/03/23 Labs - Once weekly:  CBC/D and CMP, Labs - Once weekly: ESR and CRP Method of administration: IV Push Method of administration may be changed at the discretion of home infusion pharmacist based upon assessment of the patient and/or caregiver's ability to self-administer the medication ordered. 120 Units 0   Cyanocobalamin (B-12 PO) Take 1 capsule by mouth daily.     cyclobenzaprine (FLEXERIL) 5 MG tablet Use 1 to 2 tablets (5 to 10 mg) nightly as needed for muscle spasms. 30 tablet 0   rifampin (RIFADIN) 300 MG capsule Take 1  capsule (300 mg total) by mouth 2 (two) times daily. 78 capsule 0   No facility-administered medications prior to visit.     Social History   Socioeconomic History   Marital status: Married    Spouse name: Not on file   Number of children: Not on file   Years of education: Not on file   Highest education level: Not on file  Occupational History   Not on file  Tobacco Use   Smoking status: Never   Smokeless tobacco: Never  Vaping Use   Vaping status: Never Used  Substance and Sexual Activity   Alcohol use: No   Drug use: No   Sexual activity: Yes    Birth control/protection: Surgical  Other Topics Concern   Not on file  Social History Narrative   Not on file   Social Drivers of Health   Financial Resource Strain: Not on file  Food Insecurity: No Food Insecurity (09/23/2023)   Hunger Vital Sign    Worried About Running Out of Food in the Last Year: Never true    Ran Out of Food in the Last Year: Never true  Transportation Needs: No Transportation Needs (09/23/2023)   PRAPARE - Transportation    Lack of Transportation (Medical): No    Lack of Transportation (Non-Medical): No  Physical Activity: Not on file  Stress: Not on file  Social Connections: Not on file  Intimate Partner Violence: Not At Risk (09/23/2023)   Humiliation, Afraid, Rape, and Kick questionnaire    Fear of Current or Ex-Partner: No    Emotionally Abused: No    Physically Abused: No    Sexually Abused: No      Review of Systems    All other ros negative   Objective:    Resp 16   Ht 5\' 8"  (1.727 m)   Wt 228 lb 6.4 oz (103.6 kg)   BMI 34.73 kg/m  Nursing note and vital signs reviewed.  Physical Exam     General/constitutional: no distress, pleasant HEENT: Normocephalic, PER, Conj Clear, EOMI, Oropharynx clear Neck supple CV: rrr no mrg Lungs: clear to auscultation, normal respiratory effort Abd: Soft, Nontender Ext: no edema Skin: incision had mostly healed except one spot toward  the lower end with small dehiscence and slight fluid build up; no redness/fluctuance otherwise and no tenderness Neuro: nonfocal MSK: no peripheral joint swelling/tenderness/warmth; back spines nontender   Central line presence: rue picc no purulence/tenderness   Labs: See opat labs recorded in a&p section  Micro:  Serology:  Imaging: Reviewed  10/03/23 mri lumbar spine 1. 5.8 x 7.8 x 9.4 cm peripherally enhancing subcutaneous fluid collection at the operative site likely represents a postoperative seroma. Infection is not excluded by imaging. The area could be sampled with superficial needle puncture to the right of midline. 2. Enhancing epidural granulation tissue at L4-5 and surrounding the L4 spinous process. This likely represents postoperative change. Deep infection is considered less likely. 3.  Moderate central canal stenosis at L3-4 has progressed. Mild left foraminal narrowing is similar the prior exam. 4. Moderate facet hypertrophy at L5-S1 without focal disc protrusion or stenosis. 5. Mild facet hypertrophy and disc bulging at L2-3 without stenosis.  Assessment & Plan:   Problem List Items Addressed This Visit     Hardware complicating wound infection (HCC)   Relevant Medications   cefadroxil (DURICEF) 500 MG capsule   Other Visit Diagnoses       Surgical site infection    -  Primary   Relevant Medications   cefadroxil (DURICEF) 500 MG capsule     Vertebral osteomyelitis (HCC)       Relevant Medications   cefadroxil (DURICEF) 500 MG capsule         No orders of the defined types were placed in this encounter.  Abx: 2/26-c cefazolin 2/26-c rifampin 300 mg po bid  Lines: Rue picc   58 yo female with lumbar spine hardware recent 09/22/23 admission for I&D of lumbar surgical site infection cx mssa  Timeline: Lumbar fusion 08/27/18 I&D 09/22/23 -- debridement needed down to hardware; cx mssa   2/25 admission bcx negative  Patient started on  cefazolin and rifampin  10/23/23 id clinic assessment Reviewed opat labs 3/24 cr 0.69; alkphos 109; ast/alt 16/4; tbil 0.3; cbc 7/12.5/375 (normal diff) 3/24 sed rate 51; crp 7.7 (<5) 3/10 sed rate 73; crp 20.6 (<5)  Due to early ssi infection we had decided to use rifampin adjunctive The fluid collection seen on mri 10/03/23 had ruptured and drain itself and dr Yetta Barre had attempted to also aspirate in clinic 2 weeks prior (didn't do culture as barely anything remained) There are 2 spots of incision that hadn't approximated and there is planned surgery to clean out and close this coming week No fever, chill Pain is around 4-5/10, much better since surgery No n/v/diarrhea/rash with abx  Since she'll have surgery again, will extend cefazolin for 2 weeks beyond the 4/7 date (until 11/17/23) Rifampin will go until end of 11/2023  Follow up with [place] 5-6 weeks  Cefadroxil 1000 mg twice a day given to start on 4/22  Penicillin allergy -- 15-20 years ago hives. But she has taken amoxicillin after that and tolerate fine. So I do not believe she has penicillin allergy  Follow-up: Return in about 6 weeks (around 12/04/2023).      Raymondo Band, MD [facility] 10/23/2023, 9:31 AM

## 2023-10-23 NOTE — Telephone Encounter (Signed)
 Per Provider:  Ok to extend IV abx x 2 weeks.  Provider: Rutha Bouchard MD  [facility]

## 2023-10-24 DIAGNOSIS — S300XXA Contusion of lower back and pelvis, initial encounter: Secondary | ICD-10-CM | POA: Diagnosis not present

## 2023-10-24 DIAGNOSIS — T8131XA Disruption of external operation (surgical) wound, not elsewhere classified, initial encounter: Secondary | ICD-10-CM | POA: Diagnosis not present

## 2023-10-24 DIAGNOSIS — L7634 Postprocedural seroma of skin and subcutaneous tissue following other procedure: Secondary | ICD-10-CM | POA: Diagnosis not present

## 2023-10-26 DIAGNOSIS — T8149XA Infection following a procedure, other surgical site, initial encounter: Secondary | ICD-10-CM | POA: Diagnosis not present

## 2023-10-27 ENCOUNTER — Telehealth: Payer: Self-pay

## 2023-10-27 NOTE — Telephone Encounter (Signed)
 Cervical screening and mammogram

## 2023-11-03 ENCOUNTER — Other Ambulatory Visit: Payer: Self-pay

## 2023-11-03 MED ORDER — RIFAMPIN 300 MG PO CAPS: 300.0000 mg | ORAL_CAPSULE | Freq: Two times a day (BID) | ORAL | 1 refills | Status: DC

## 2023-11-04 DIAGNOSIS — T8149XA Infection following a procedure, other surgical site, initial encounter: Secondary | ICD-10-CM | POA: Diagnosis not present

## 2023-11-09 DIAGNOSIS — T8149XA Infection following a procedure, other surgical site, initial encounter: Secondary | ICD-10-CM | POA: Diagnosis not present

## 2023-11-10 DIAGNOSIS — T8149XA Infection following a procedure, other surgical site, initial encounter: Secondary | ICD-10-CM | POA: Diagnosis not present

## 2023-11-17 ENCOUNTER — Ambulatory Visit: Admitting: Family Medicine

## 2023-11-17 ENCOUNTER — Telehealth: Payer: Self-pay

## 2023-11-17 ENCOUNTER — Other Ambulatory Visit: Payer: Self-pay | Admitting: Sports Medicine

## 2023-11-17 DIAGNOSIS — G8929 Other chronic pain: Secondary | ICD-10-CM

## 2023-11-17 DIAGNOSIS — M4316 Spondylolisthesis, lumbar region: Secondary | ICD-10-CM

## 2023-11-17 DIAGNOSIS — M899 Disorder of bone, unspecified: Secondary | ICD-10-CM

## 2023-11-17 DIAGNOSIS — T8149XA Infection following a procedure, other surgical site, initial encounter: Secondary | ICD-10-CM | POA: Diagnosis not present

## 2023-11-17 NOTE — Telephone Encounter (Signed)
 Verbal orders given Clovis Dar with [facility] for patient's picc line to be removed. Shari verbalized understanding.  Suleyma Wafer Adel Holt, CMA

## 2023-11-17 NOTE — Progress Notes (Unsigned)
 Order placed

## 2023-11-18 ENCOUNTER — Telehealth: Payer: Self-pay | Admitting: Sports Medicine

## 2023-11-18 ENCOUNTER — Other Ambulatory Visit: Payer: Self-pay | Admitting: Family Medicine

## 2023-11-18 DIAGNOSIS — I10 Essential (primary) hypertension: Secondary | ICD-10-CM

## 2023-11-18 NOTE — Telephone Encounter (Signed)
 Pt states that we ordered a 6 month follow up MRI for patient, she has scheduled it. But pt has had recent surgery to correct the original issue and is not sure why she is having this MRI.

## 2023-11-18 NOTE — Telephone Encounter (Signed)
Called and canceled MRI

## 2023-11-20 DIAGNOSIS — T148XXD Other injury of unspecified body region, subsequent encounter: Secondary | ICD-10-CM | POA: Diagnosis not present

## 2023-11-20 DIAGNOSIS — M4316 Spondylolisthesis, lumbar region: Secondary | ICD-10-CM | POA: Diagnosis not present

## 2023-12-02 ENCOUNTER — Encounter: Payer: Self-pay | Admitting: Internal Medicine

## 2023-12-02 ENCOUNTER — Ambulatory Visit: Admitting: Internal Medicine

## 2023-12-02 ENCOUNTER — Other Ambulatory Visit: Payer: Self-pay

## 2023-12-02 VITALS — BP 123/82 | HR 74 | Temp 97.6°F | Ht 68.0 in | Wt 227.0 lb

## 2023-12-02 DIAGNOSIS — M4626 Osteomyelitis of vertebra, lumbar region: Secondary | ICD-10-CM

## 2023-12-02 DIAGNOSIS — T847XXD Infection and inflammatory reaction due to other internal orthopedic prosthetic devices, implants and grafts, subsequent encounter: Secondary | ICD-10-CM | POA: Diagnosis not present

## 2023-12-02 DIAGNOSIS — M4620 Osteomyelitis of vertebra, site unspecified: Secondary | ICD-10-CM

## 2023-12-02 MED ORDER — RIFAMPIN 300 MG PO CAPS
300.0000 mg | ORAL_CAPSULE | Freq: Two times a day (BID) | ORAL | 0 refills | Status: AC
Start: 2023-12-02 — End: 2023-12-16

## 2023-12-02 NOTE — Patient Instructions (Signed)
 You are doing well  Labs can be checked when you see your surgeon in June   See me a couple weeks after surgery follow up   Finish rifampin  last week of this month   Continue cefadroxil  1000 mg twice a day for a very long time (at least end of this summer, then decrease dose to 500 mg twice a day starting the fall)

## 2023-12-02 NOTE — Progress Notes (Signed)
 [facility]  Patient Active Problem List   Diagnosis Date Noted   Hardware complicating wound infection (HCC) 09/24/2023   Wound infection after surgery 09/22/2023   S/P lumbar fusion 08/29/2023   Non-recurrent acute suppurative otitis media of both ears without spontaneous rupture of tympanic membranes 06/13/2023   Acute MEE (middle ear effusion), bilateral 06/13/2023   Acute swimmer's ear of both sides 06/13/2023   History of urethral stricture 04/11/2022   Joint pain 09/27/2021   Neoplasm of uncertain behavior of skin 09/27/2021   Psoriasis 09/27/2021   Thinning hair 09/27/2021   Obesity (BMI 35.0-39.9 without comorbidity) 09/04/2021   Medication refill 07/19/2021   Other headache syndrome 07/19/2021   Other fatigue 07/19/2021   Acute cough 07/19/2021   Acute cystitis without hematuria 03/14/2021   Morbid obesity (HCC) 12/08/2020   Acute non-recurrent maxillary sinusitis 10/02/2020   Anal fissure 06/07/2019   Hematuria 06/07/2019   Left foot pain 02/11/2019   Closed displaced trimalleolar fracture of right ankle 11/20/2018   Nonspecific syndrome suggestive of viral illness 11/16/2018   Bad odor of urine 06/08/2018   Acute non-recurrent frontal sinusitis 06/08/2018   Suprapubic pressure 06/08/2018   Eustachian tube dysfunction, right 06/08/2018   Drug allergy, multiple 06/08/2018   Rhus dermatitis 01/20/2018   Dysuria 07/31/2017   Frequent UTI 07/31/2017   Abdominal pain 03/31/2017   Hx laparoscopic cholecystectomy 03/31/2017   Calculus of gallbladder without cholecystitis without obstruction 03/26/2017   Family history of colonic polyps 10/02/2016   Routine health maintenance 09/17/2012      Subjective:    Patient ID: Dawn Mcguire, female    DOB: 1966-07-08, 58 y.o.   MRN: [id number]  Chief Complaint  Patient presents with   Follow-up    HPI:  Dawn Mcguire is a 58 y.o. female here for hospital f/u of lumbar spine  fusion hardware surgical site related infection   10/23/23 id clinic first fu See a&p for more detail Wound site seroma after hospital discharge had ruptured (seroma seen on mri 3/06); pending I&D and reclosure next week Pain much better Inflammatory markers also improving Tolerating cefazolin  and oral rifampin  Blood pressure running a little high and started on amlodipine  low dose No other complaint   12/02/23 id clinic f/u Started abx 2/26 cefazolin /rifampin  Transitioned cefazolin  to cefadroxil  4/22 Planned rifampin  to end at the end of 11/2023 Doing well no complaint today 2 weeks ago did have to go to surgery for minor I&D of the incision as some clear drainage remained and reclosed --> all healed by now No cultures were sent as no sign of infection (Clear fluid/blood)   Allergies  Allergen Reactions   Latex Rash    Tears skin and break out   Sulfa Antibiotics Itching and Nausea And Vomiting    Skin becomes red and feels like it is burning   Sulfasalazine Hives, Itching and Nausea And Vomiting    Skin becomes red and feels like it is burning    Tape     Tears skin. Patient prefers paper tape    Corticosteroids Hives, Itching and Rash    fever   Nitrofurantoin Monohyd Macro Nausea And Vomiting   Penicillins Hives, Itching and Rash            Outpatient Medications Prior to Visit  Medication Sig Dispense Refill   acetaminophen  (TYLENOL ) 500 MG tablet Take 1,000 mg by mouth 2 (two) times daily.     amLODipine  (  NORVASC ) 2.5 MG tablet Take 1 tablet (2.5 mg total) by mouth daily. 30 tablet 0   Ascorbic Acid  (VITAMIN C  PO) Take by mouth.     cefadroxil  (DURICEF) 500 MG capsule Take 2 capsules (1,000 mg total) by mouth 2 (two) times daily. 120 capsule 5   Cyanocobalamin (B-12 PO) Take 1 capsule by mouth daily.     cyclobenzaprine  (FLEXERIL ) 5 MG tablet Use 1 to 2 tablets (5 to 10 mg) nightly as needed for muscle spasms. 30 tablet 0   Multiple Vitamins-Minerals (ZINC  PO)  Take by mouth.     rifampin  (RIFADIN ) 300 MG capsule Take 1 capsule (300 mg total) by mouth 2 (two) times daily. 60 capsule 1   VITAMIN D PO Take by mouth.     Biotin  1 MG CAPS Take 1 mg by mouth daily. (Patient not taking: Reported on 12/02/2023)     No facility-administered medications prior to visit.     Social History   Socioeconomic History   Marital status: Married    Spouse name: Not on file   Number of children: Not on file   Years of education: Not on file   Highest education level: Not on file  Occupational History   Not on file  Tobacco Use   Smoking status: Never   Smokeless tobacco: Never  Vaping Use   Vaping status: Never Used  Substance and Sexual Activity   Alcohol use: No   Drug use: No   Sexual activity: Yes    Birth control/protection: Surgical  Other Topics Concern   Not on file  Social History Narrative   Not on file   Social Drivers of Health   Financial Resource Strain: Not on file  Food Insecurity: No Food Insecurity (09/23/2023)   Hunger Vital Sign    Worried About Running Out of Food in the Last Year: Never true    Ran Out of Food in the Last Year: Never true  Transportation Needs: No Transportation Needs (09/23/2023)   PRAPARE - Transportation    Lack of Transportation (Medical): No    Lack of Transportation (Non-Medical): No  Physical Activity: Not on file  Stress: Not on file  Social Connections: Not on file  Intimate Partner Violence: Not At Risk (09/23/2023)   Humiliation, Afraid, Rape, and Kick questionnaire    Fear of Current or Ex-Partner: No    Emotionally Abused: No    Physically Abused: No    Sexually Abused: No      Review of Systems    All other ros negative   Objective:    Ht 5\' 8"  (1.727 m)   Wt 227 lb (103 kg)   BMI 34.52 kg/m  Nursing note and vital signs reviewed.  Physical Exam     General/constitutional: no distress, pleasant HEENT: Normocephalic, PER, Conj Clear, EOMI, Oropharynx clear Neck  supple CV: rrr no mrg Lungs: clear to auscultation, normal respiratory effort Abd: Soft, Nontender Ext: no edema Skin: lower ack incision healed, no tenderness, redness, fluctuance, warmth MSK: no peripheral joint swelling/tenderness/warmth; back spines nontender   Labs: See opat labs recorded in a&p section  Micro:  Serology:  Imaging: Reviewed  10/03/23 mri lumbar spine 1. 5.8 x 7.8 x 9.4 cm peripherally enhancing subcutaneous fluid collection at the operative site likely represents a postoperative seroma. Infection is not excluded by imaging. The area could be sampled with superficial needle puncture to the right of midline. 2. Enhancing epidural granulation tissue at L4-5 and surrounding the L4 spinous  process. This likely represents postoperative change. Deep infection is considered less likely. 3. Moderate central canal stenosis at L3-4 has progressed. Mild left foraminal narrowing is similar the prior exam. 4. Moderate facet hypertrophy at L5-S1 without focal disc protrusion or stenosis. 5. Mild facet hypertrophy and disc bulging at L2-3 without stenosis.  Assessment & Plan:   Problem List Items Addressed This Visit     Hardware complicating wound infection (HCC)   Other Visit Diagnoses       Vertebral osteomyelitis (HCC)    -  Primary          No orders of the defined types were placed in this encounter.  Abx: 2/26-c cefazolin  2/26-c rifampin  300 mg po bid    58 yo female with lumbar spine hardware recent 09/22/23 admission for I&D of lumbar surgical site infection cx mssa (S bactrim, tetracycline)  Timeline: Lumbar fusion 08/27/18 I&D 09/22/23 -- debridement needed down to hardware; cx mssa   2/25 admission bcx negative  Patient started on cefazolin  and rifampin   10/23/23 id clinic assessment Reviewed opat labs 3/24 cr 0.69; alkphos 109; ast/alt 16/4; tbil 0.3; cbc 7/12.5/375 (normal diff) 3/24 sed rate 51; crp 7.7 (<5) 3/10 sed rate 73; crp  20.6 (<5)  Due to early ssi infection we had decided to use rifampin  adjunctive The fluid collection seen on mri 10/03/23 had ruptured and drain itself and dr Rochelle Chu had attempted to also aspirate in clinic 2 weeks prior (didn't do culture as barely anything remained) There are 2 spots of incision that hadn't approximated and there is planned surgery to clean out and close this coming week No fever, chill Pain is around 4-5/10, much better since surgery No n/v/diarrhea/rash with abx  Since she'll have surgery again, will extend cefazolin  for 2 weeks beyond the 4/7 date (until 11/17/23) Rifampin  will go until end of 11/2023  Follow up with us  5-6 weeks  Cefadroxil  1000 mg twice a day given to start on 4/22  Penicillin allergy -- 15-20 years ago hives. But she has taken amoxicillin after that and tolerate fine. So I do not believe she has penicillin allergy    12/02/23 id clinic f/u Reviewed outside labs 11/03/23 crp 6.6 (range <5); esr 34 11/10/23 crp < 5; esr 31; cr 0.6; lft 15/5/94/0.3; cbc 7.6/13.3/256 She has still some seroma/oozing and was taken to outpatient surgery for minor I&D with repeat closure Intermittent 3/10 pain in lower back  No abx side effect She'll see nsg in June 2025  She wants to wait until June to do blood tests again as she feels really well    Discuss with her again rational for rifampin  of 3 months use (refill another 2 weeks) and at least a year for cefadroxil , if not longer     Follow-up: Return in about 2 months (around 02/01/2024).      Jamesetta Mcbride, MD [facility] 12/02/2023, 9:57 AM

## 2023-12-05 ENCOUNTER — Other Ambulatory Visit

## 2023-12-30 ENCOUNTER — Encounter: Admitting: Nurse Practitioner

## 2024-01-28 ENCOUNTER — Ambulatory Visit: Admitting: Internal Medicine

## 2024-02-03 ENCOUNTER — Encounter: Payer: Self-pay | Admitting: Internal Medicine

## 2024-02-03 ENCOUNTER — Other Ambulatory Visit: Payer: Self-pay

## 2024-02-03 ENCOUNTER — Ambulatory Visit: Admitting: Internal Medicine

## 2024-02-03 VITALS — BP 139/93 | HR 72 | Temp 97.8°F | Ht 68.0 in | Wt 234.0 lb

## 2024-02-03 DIAGNOSIS — T847XXD Infection and inflammatory reaction due to other internal orthopedic prosthetic devices, implants and grafts, subsequent encounter: Secondary | ICD-10-CM | POA: Diagnosis not present

## 2024-02-03 DIAGNOSIS — B9689 Other specified bacterial agents as the cause of diseases classified elsewhere: Secondary | ICD-10-CM

## 2024-02-03 DIAGNOSIS — M4620 Osteomyelitis of vertebra, site unspecified: Secondary | ICD-10-CM

## 2024-02-03 NOTE — Patient Instructions (Signed)
 Please transition cefadroxil  to the lower dose 500 mg twice a day the first week of 03/2024   See me end of 03/2024    Please let dr joshua know about the pocket of fluid that was felt on exam today   If it breaks open likely will need to culture and I&D again

## 2024-02-03 NOTE — Progress Notes (Signed)
 [facility]  Patient Active Problem List   Diagnosis Date Noted   Hardware complicating wound infection (HCC) 09/24/2023   Wound infection after surgery 09/22/2023   S/P lumbar fusion 08/29/2023   Non-recurrent acute suppurative otitis media of both ears without spontaneous rupture of tympanic membranes 06/13/2023   Acute MEE (middle ear effusion), bilateral 06/13/2023   Acute swimmer's ear of both sides 06/13/2023   History of urethral stricture 04/11/2022   Joint pain 09/27/2021   Neoplasm of uncertain behavior of skin 09/27/2021   Psoriasis 09/27/2021   Thinning hair 09/27/2021   Obesity (BMI 35.0-39.9 without comorbidity) 09/04/2021   Medication refill 07/19/2021   Other headache syndrome 07/19/2021   Other fatigue 07/19/2021   Acute cough 07/19/2021   Acute cystitis without hematuria 03/14/2021   Morbid obesity (HCC) 12/08/2020   Acute non-recurrent maxillary sinusitis 10/02/2020   Anal fissure 06/07/2019   Hematuria 06/07/2019   Left foot pain 02/11/2019   Closed displaced trimalleolar fracture of right ankle 11/20/2018   Nonspecific syndrome suggestive of viral illness 11/16/2018   Bad odor of urine 06/08/2018   Acute non-recurrent frontal sinusitis 06/08/2018   Suprapubic pressure 06/08/2018   Eustachian tube dysfunction, right 06/08/2018   Drug allergy, multiple 06/08/2018   Rhus dermatitis 01/20/2018   Dysuria 07/31/2017   Frequent UTI 07/31/2017   Abdominal pain 03/31/2017   Hx laparoscopic cholecystectomy 03/31/2017   Calculus of gallbladder without cholecystitis without obstruction 03/26/2017   Family history of colonic polyps 10/02/2016   Routine health maintenance 09/17/2012      Subjective:    Patient ID: Dawn Mcguire, female    DOB: 1965/10/23, 58 y.o.   MRN: [id number]  Chief Complaint  Patient presents with   Follow-up    HPI:  Dawn Mcguire is a 58 y.o. female here for hospital f/u of lumbar spine  fusion hardware surgical site related infection   10/23/23 id clinic first fu See a&p for more detail Wound site seroma after hospital discharge had ruptured (seroma seen on mri 3/06); pending I&D and reclosure next week Pain much better Inflammatory markers also improving Tolerating cefazolin  and oral rifampin  Blood pressure running a little high and started on amlodipine  low dose No other complaint   12/02/23 id clinic f/u Started abx 09/24/23 cefazolin /rifampin  Transitioned cefazolin  to cefadroxil  4/22 Planned rifampin  to end at the end of 11/2023 Doing well no complaint today 2 weeks ago did have to go to surgery for minor I&D of the incision as some clear drainage remained and reclosed --> all healed by now No cultures were sent as no sign of infection (Clear fluid/blood)   02/03/24 id clinic f/u Had one small superficial surgery to close the wound after 12/02/23 visit with me Intermittent lower back pain with rigorous activities No side effect from cefadroxil  See a&p for detail  Allergies  Allergen Reactions   Latex Rash    Tears skin and break out   Sulfa Antibiotics Itching and Nausea And Vomiting    Skin becomes red and feels like it is burning   Sulfasalazine Hives, Itching and Nausea And Vomiting    Skin becomes red and feels like it is burning    Tape     Tears skin. Patient prefers paper tape    Corticosteroids Hives, Itching and Rash    fever   Nitrofurantoin Monohyd Macro Nausea And Vomiting   Penicillins Hives, Itching and Rash  Outpatient Medications Prior to Visit  Medication Sig Dispense Refill   acetaminophen  (TYLENOL ) 500 MG tablet Take 1,000 mg by mouth 2 (two) times daily.     Ascorbic Acid  (VITAMIN C  PO) Take by mouth.     Biotin  1 MG CAPS Take 1 mg by mouth daily.     cefadroxil  (DURICEF) 500 MG capsule Take 2 capsules (1,000 mg total) by mouth 2 (two) times daily. 120 capsule 5   Cyanocobalamin (B-12 PO) Take 1 capsule by mouth  daily.     cyclobenzaprine  (FLEXERIL ) 5 MG tablet Use 1 to 2 tablets (5 to 10 mg) nightly as needed for muscle spasms. 30 tablet 0   Multiple Vitamins-Minerals (ZINC  PO) Take by mouth.     VITAMIN D PO Take by mouth.     amLODipine  (NORVASC ) 2.5 MG tablet Take 1 tablet (2.5 mg total) by mouth daily. (Patient not taking: Reported on 02/03/2024) 30 tablet 0   No facility-administered medications prior to visit.     Social History   Socioeconomic History   Marital status: Married    Spouse name: Not on file   Number of children: Not on file   Years of education: Not on file   Highest education level: Not on file  Occupational History   Not on file  Tobacco Use   Smoking status: Never   Smokeless tobacco: Never  Vaping Use   Vaping status: Never Used  Substance and Sexual Activity   Alcohol use: No   Drug use: No   Sexual activity: Yes    Birth control/protection: Surgical  Other Topics Concern   Not on file  Social History Narrative   Not on file   Social Drivers of Health   Financial Resource Strain: Not on file  Food Insecurity: No Food Insecurity (09/23/2023)   Hunger Vital Sign    Worried About Running Out of Food in the Last Year: Never true    Ran Out of Food in the Last Year: Never true  Transportation Needs: No Transportation Needs (09/23/2023)   PRAPARE - Transportation    Lack of Transportation (Medical): No    Lack of Transportation (Non-Medical): No  Physical Activity: Not on file  Stress: Not on file  Social Connections: Not on file  Intimate Partner Violence: Not At Risk (09/23/2023)   Humiliation, Afraid, Rape, and Kick questionnaire    Fear of Current or Ex-Partner: No    Emotionally Abused: No    Physically Abused: No    Sexually Abused: No      Review of Systems    All other ros negative   Objective:    BP (!) 139/93   Pulse 72   Temp 97.8 F (36.6 C) (Temporal)   Ht 5' 8 (1.727 m)   Wt 234 lb (106.1 kg)   SpO2 98%   BMI 35.58 kg/m   Nursing note and vital signs reviewed.  Physical Exam     General/constitutional: no distress, pleasant HEENT: Normocephalic, PER, Conj Clear, EOMI, Oropharynx clear Neck supple CV: rrr no mrg Lungs: clear to auscultation, normal respiratory effort Abd: Soft, Nontender Ext: no edema Skin: incision healed; there is a large soft/fluctuant collection just inferior end of incision; no tenderness/erythema/dehiscence MSK: no peripheral joint swelling/tenderness/warmth; back spines nontender   Labs: See opat labs recorded in a&p section  Micro:  Serology:  Imaging: Reviewed  10/03/23 mri lumbar spine 1. 5.8 x 7.8 x 9.4 cm peripherally enhancing subcutaneous fluid collection at the operative site likely  represents a postoperative seroma. Infection is not excluded by imaging. The area could be sampled with superficial needle puncture to the right of midline. 2. Enhancing epidural granulation tissue at L4-5 and surrounding the L4 spinous process. This likely represents postoperative change. Deep infection is considered less likely. 3. Moderate central canal stenosis at L3-4 has progressed. Mild left foraminal narrowing is similar the prior exam. 4. Moderate facet hypertrophy at L5-S1 without focal disc protrusion or stenosis. 5. Mild facet hypertrophy and disc bulging at L2-3 without stenosis.  Assessment & Plan:   Problem List Items Addressed This Visit     Hardware complicating wound infection (HCC)   Other Visit Diagnoses       Vertebral osteomyelitis (HCC)    -  Primary   Relevant Orders   CBC   COMPLETE METABOLIC PANEL WITHOUT GFR   C-reactive protein           No orders of the defined types were placed in this encounter.  Abx: 2/26-c cefazolin  2/26-c rifampin  300 mg po bid    58 yo female with lumbar spine hardware recent 09/22/23 admission for I&D of lumbar surgical site infection cx mssa (S bactrim, tetracycline)  Timeline: Lumbar fusion  08/27/18 I&D 09/22/23 -- debridement needed down to hardware; cx mssa   2/25 admission bcx negative  Patient started on cefazolin  and rifampin   10/23/23 id clinic assessment Reviewed opat labs 3/24 cr 0.69; alkphos 109; ast/alt 16/4; tbil 0.3; cbc 7/12.5/375 (normal diff) 3/24 sed rate 51; crp 7.7 (<5) 3/10 sed rate 73; crp 20.6 (<5)  Due to early ssi infection we had decided to use rifampin  adjunctive The fluid collection seen on mri 10/03/23 had ruptured and drain itself and dr Joshua had attempted to also aspirate in clinic 2 weeks prior (didn't do culture as barely anything remained) There are 2 spots of incision that hadn't approximated and there is planned surgery to clean out and close this coming week No fever, chill Pain is around 4-5/10, much better since surgery No n/v/diarrhea/rash with abx  Since she'll have surgery again, will extend cefazolin  for 2 weeks beyond the 4/7 date (until 11/17/23) Rifampin  will go until end of 11/2023  Follow up with us  5-6 weeks  Cefadroxil  1000 mg twice a day given to start on 4/22  Penicillin allergy -- 15-20 years ago hives. But she has taken amoxicillin after that and tolerate fine. So I do not believe she has penicillin allergy    12/02/23 id clinic assessment Reviewed outside labs 11/03/23 crp 6.6 (range <5); esr 34 11/10/23 crp < 5; esr 31; cr 0.6; lft 15/5/94/0.3; cbc 7.6/13.3/256 She has still some seroma/oozing and was taken to outpatient surgery for minor I&D with repeat closure Intermittent 3/10 pain in lower back  No abx side effect She'll see nsg in June 2025  She wants to wait until June to do blood tests again as she feels really well  02/03/24 id clinic assessment Had transitioned to cefadroxil   11/18/23 and finished 3 months rifampin  by 11/2023 No complaint today  Continue cefadroxil  1000 mg po bid until first week 03/2024; will consider transition to 500 mg po bid suppression there after  She inquires about trial off abx  again and I discussed no labs will be able to tell us  that she is cured until we go off. Will discuss this issue again at 1-2 years out  She does have a fluctuance but nontender area right inferior to the incision -- she sees dr Joshua  again this week  Crp, cbc, bmp today  Follow up in end of September 2025     Follow-up: Return in about 3 months (around 05/05/2024).      Constance ONEIDA Passer, MD [facility] 02/03/2024, 3:59 PM

## 2024-02-04 LAB — C-REACTIVE PROTEIN: CRP: <3 mg/L (ref <8.0)

## 2024-02-04 LAB — COMPLETE METABOLIC PANEL WITHOUT GFR
AG Ratio: 1.6 (calc) (ref 1.0–2.5)
ALT: 14 U/L (ref 6–29)
AST: 17 U/L (ref 10–35)
Albumin: 4.4 g/dL (ref 3.6–5.1)
Alkaline phosphatase (APISO): 95 U/L (ref 37–153)
BUN: 19 mg/dL (ref 7–25)
CO2: 25 mmol/L (ref 20–32)
Calcium: 9.5 mg/dL (ref 8.6–10.4)
Chloride: 105 mmol/L (ref 98–110)
Creat: 0.76 mg/dL (ref 0.50–1.05)
Globulin: 2.7 g/dL (ref 1.9–3.7)
Glucose, Bld: 90 mg/dL (ref 65–99)
Potassium: 4 mmol/L (ref 3.5–5.3)
Sodium: 140 mmol/L (ref 135–146)
Total Bilirubin: 0.4 mg/dL (ref 0.2–1.2)
Total Protein: 7.1 g/dL (ref 6.1–8.1)

## 2024-02-04 LAB — CBC
HCT: 43.9 % (ref 35.0–45.0)
Hemoglobin: 14 g/dL (ref 11.7–15.5)
MCH: 28.7 pg (ref 27.0–33.0)
MCHC: 31.9 g/dL — ABNORMAL LOW (ref 32.0–36.0)
MCV: 90.1 fL (ref 80.0–100.0)
MPV: 11.4 fL (ref 7.5–12.5)
Platelets: 295 Thousand/uL (ref 140–400)
RBC: 4.87 Million/uL (ref 3.80–5.10)
RDW: 11.9 % (ref 11.0–15.0)
WBC: 9 Thousand/uL (ref 3.8–10.8)

## 2024-02-12 DIAGNOSIS — M4316 Spondylolisthesis, lumbar region: Secondary | ICD-10-CM | POA: Diagnosis not present

## 2024-02-12 DIAGNOSIS — Z6835 Body mass index (BMI) 35.0-35.9, adult: Secondary | ICD-10-CM | POA: Diagnosis not present

## 2024-04-13 ENCOUNTER — Ambulatory Visit: Admitting: Internal Medicine

## 2024-05-11 ENCOUNTER — Other Ambulatory Visit: Payer: Self-pay

## 2024-05-11 ENCOUNTER — Ambulatory Visit (INDEPENDENT_AMBULATORY_CARE_PROVIDER_SITE_OTHER): Admitting: Internal Medicine

## 2024-05-11 ENCOUNTER — Encounter: Payer: Self-pay | Admitting: Internal Medicine

## 2024-05-11 VITALS — BP 143/84 | HR 82 | Temp 97.8°F | Resp 16 | Wt 243.0 lb

## 2024-05-11 DIAGNOSIS — M4620 Osteomyelitis of vertebra, site unspecified: Secondary | ICD-10-CM | POA: Diagnosis not present

## 2024-05-11 DIAGNOSIS — T847XXD Infection and inflammatory reaction due to other internal orthopedic prosthetic devices, implants and grafts, subsequent encounter: Secondary | ICD-10-CM

## 2024-05-11 DIAGNOSIS — T8463XD Infection and inflammatory reaction due to internal fixation device of spine, subsequent encounter: Secondary | ICD-10-CM | POA: Diagnosis not present

## 2024-05-11 NOTE — Progress Notes (Signed)
 [facility]  Patient Active Problem List   Diagnosis Date Noted   Hardware complicating wound infection 09/24/2023   Wound infection after surgery 09/22/2023   S/P lumbar fusion 08/29/2023   Non-recurrent acute suppurative otitis media of both ears without spontaneous rupture of tympanic membranes 06/13/2023   Acute MEE (middle ear effusion), bilateral 06/13/2023   Acute swimmer's ear of both sides 06/13/2023   History of urethral stricture 04/11/2022   Joint pain 09/27/2021   Neoplasm of uncertain behavior of skin 09/27/2021   Psoriasis 09/27/2021   Thinning hair 09/27/2021   Obesity (BMI 35.0-39.9 without comorbidity) 09/04/2021   Medication refill 07/19/2021   Other headache syndrome 07/19/2021   Other fatigue 07/19/2021   Acute cough 07/19/2021   Acute cystitis without hematuria 03/14/2021   Morbid obesity (HCC) 12/08/2020   Acute non-recurrent maxillary sinusitis 10/02/2020   Anal fissure 06/07/2019   Hematuria 06/07/2019   Left foot pain 02/11/2019   Closed displaced trimalleolar fracture of right ankle 11/20/2018   Nonspecific syndrome suggestive of viral illness 11/16/2018   Bad odor of urine 06/08/2018   Acute non-recurrent frontal sinusitis 06/08/2018   Suprapubic pressure 06/08/2018   Eustachian tube dysfunction, right 06/08/2018   Drug allergy, multiple 06/08/2018   Rhus dermatitis 01/20/2018   Dysuria 07/31/2017   Frequent UTI 07/31/2017   Abdominal pain 03/31/2017   Hx laparoscopic cholecystectomy 03/31/2017   Calculus of gallbladder without cholecystitis without obstruction 03/26/2017   Family history of colonic polyps 10/02/2016   Routine health maintenance 09/17/2012      Subjective:    Patient ID: Dawn Mcguire, female    DOB: 09/17/1965, 58 y.o.   MRN: [id number]  Chief Complaint  Patient presents with   Follow-up    Vertebral osteomyelitis     HPI:  Dawn Mcguire is a 58 y.o. female here for  hospital f/u of lumbar spine fusion hardware surgical site related infection   10/23/23 id clinic first fu See a&p for more detail Wound site seroma after hospital discharge had ruptured (seroma seen on mri 3/06); pending I&D and reclosure next week Pain much better Inflammatory markers also improving Tolerating cefazolin  and oral rifampin  Blood pressure running a little high and started on amlodipine  low dose No other complaint   12/02/23 id clinic f/u Started abx 09/24/23 cefazolin /rifampin  Transitioned cefazolin  to cefadroxil  4/22 Planned rifampin  to end at the end of 11/2023 Doing well no complaint today 2 weeks ago did have to go to surgery for minor I&D of the incision as some clear drainage remained and reclosed --> all healed by now No cultures were sent as no sign of infection (Clear fluid/blood)   02/03/24 id clinic f/u Had one small superficial surgery to close the wound after 12/02/23 visit with me Intermittent lower back pain with rigorous activities No side effect from cefadroxil  See a&p for detail   05/11/24 id clinic f/u On cefadroxil  1000 mg bid 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 Patient prefers paper tape    Corticosteroids Hives, Itching and Rash    fever   Nitrofurantoin Monohyd Macro Nausea And Vomiting   Penicillins Hives, Itching and Rash            Outpatient Medications Prior to Visit  Medication Sig Dispense Refill   acetaminophen  (TYLENOL ) 500 MG tablet  Take 1,000 mg by mouth 2 (two) times daily.     Ascorbic Acid  (VITAMIN C  PO) Take by mouth.     Biotin  1 MG CAPS Take 1 mg by mouth daily.     cefadroxil  (DURICEF) 500 MG capsule Take by mouth.     Cyanocobalamin (B-12 PO) Take 1 capsule by mouth daily.     cyclobenzaprine  (FLEXERIL ) 5 MG tablet Use 1 to 2 tablets (5 to 10 mg) nightly as needed for muscle spasms. 30 tablet 0   Multiple Vitamins-Minerals (ZINC  PO) Take by mouth.     VITAMIN D PO Take by mouth.     amLODipine  (NORVASC ) 2.5 MG tablet Take 1 tablet (2.5 mg total) by mouth daily. (Patient not taking: Reported on 05/11/2024) 30 tablet 0   No facility-administered medications prior to visit.     Social History   Socioeconomic History   Marital status: Married    Spouse name: Not on file   Number of children: Not on file   Years of education: Not on file   Highest education level: Not on file  Occupational History   Not on file  Tobacco Use   Smoking status: Never   Smokeless tobacco: Never  Vaping Use   Vaping status: Never Used  Substance and Sexual Activity   Alcohol use: No   Drug use: No   Sexual activity: Yes    Birth control/protection: Surgical  Other Topics Concern   Not on file  Social History Narrative   Not on file   Social Drivers of Health   Financial Resource Strain: Not on file  Food Insecurity: No Food Insecurity (09/23/2023)   Hunger Vital Sign    Worried About Running Out of Food in the Last Year: Never true    Ran Out of Food in the Last Year: Never true  Transportation Needs: No Transportation Needs (09/23/2023)   PRAPARE - Transportation    Lack of Transportation (Medical): No    Lack of Transportation (Non-Medical): No  Physical Activity: Not on file  Stress: Not on file  Social Connections: Not on file  Intimate Partner Violence: Not At Risk (09/23/2023)   Humiliation, Afraid, Rape, and Kick questionnaire    Fear of Current or Ex-Partner: No    Emotionally Abused: No    Physically  Abused: No    Sexually Abused: No      Review of Systems    All other ros negative   Objective:    There were no vitals taken for this visit. Nursing note and vital signs reviewed.  Physical Exam     General/constitutional: no distress, pleasant HEENT: Normocephalic, PER, Conj Clear, EOMI, Oropharynx clear Neck supple CV: rrr no mrg Lungs: clear to auscultation, normal respiratory effort Abd: Soft, Nontender Ext: no edema Skin: incision healed; there is a large soft/fluctuant collection just inferior end of incision; no tenderness/erythema/dehiscence MSK: no peripheral joint swelling/tenderness/warmth; back spines nontender   Labs: See opat labs recorded in a&p section  Micro:  Serology:  Imaging: Reviewed  10/03/23 mri lumbar spine 1.  5.8 x 7.8 x 9.4 cm peripherally enhancing subcutaneous fluid collection at the operative site likely represents a postoperative seroma. Infection is not excluded by imaging. The area could be sampled with superficial needle puncture to the right of midline. 2. Enhancing epidural granulation tissue at L4-5 and surrounding the L4 spinous process. This likely represents postoperative change. Deep infection is considered less likely. 3. Moderate central canal stenosis at L3-4 has progressed. Mild left foraminal narrowing is similar the prior exam. 4. Moderate facet hypertrophy at L5-S1 without focal disc protrusion or stenosis. 5. Mild facet hypertrophy and disc bulging at L2-3 without stenosis.  Assessment & Plan:   Problem List Items Addressed This Visit     Hardware complicating wound infection   Relevant Medications   cefadroxil  (DURICEF) 500 MG capsule   Other Relevant Orders   CBC   COMPLETE METABOLIC PANEL WITHOUT GFR   C-reactive protein   Other Visit Diagnoses       Vertebral osteomyelitis (HCC)    -  Primary   Relevant Medications   cefadroxil  (DURICEF) 500 MG capsule           Abx: 4/22-c cefadroxil   1000mg  bid --> 500 mg bid on 05/11/24   2/26-4/22/25 cefazolin  2/26-5/31/25 rifampin  300 mg po bid    58 yo female with lumbar spine hardware recent 09/22/23 admission for I&D of lumbar surgical site infection cx mssa (S bactrim, tetracycline)  Timeline: Lumbar fusion 08/27/18 I&D 09/22/23 -- debridement needed down to hardware; cx mssa   2/25 admission bcx negative  Patient started on cefazolin  and rifampin   10/23/23 id clinic assessment Reviewed opat labs 3/24 cr 0.69; alkphos 109; ast/alt 16/4; tbil 0.3; cbc 7/12.5/375 (normal diff) 3/24 sed rate 51; crp 7.7 (<5) 3/10 sed rate 73; crp 20.6 (<5)  Due to early ssi infection we had decided to use rifampin  adjunctive The fluid collection seen on mri 10/03/23 had ruptured and drain itself and dr Joshua had attempted to also aspirate in clinic 2 weeks prior (didn't do culture as barely anything remained) There are 2 spots of incision that hadn't approximated and there is planned surgery to clean out and close this coming week No fever, chill Pain is around 4-5/10, much better since surgery No n/v/diarrhea/rash with abx  Since she'll have surgery again, will extend cefazolin  for 2 weeks beyond the 4/7 date (until 11/17/23) Rifampin  will go until end of 11/2023  Follow up with us  5-6 weeks  Cefadroxil  1000 mg twice a day given to start on 11/18/23  Penicillin allergy -- 15-20 years ago hives. But she has taken amoxicillin after that and tolerate fine. So I do not believe she has penicillin allergy    12/02/23 id clinic assessment Reviewed outside labs 11/03/23 crp 6.6 (range <5); esr 34 11/10/23 crp < 5; esr 31; cr 0.6; lft 15/5/94/0.3; cbc 7.6/13.3/256 She has still some seroma/oozing and was taken to outpatient surgery for minor I&D with repeat closure Intermittent 3/10 pain in lower back  No abx side effect She'll see nsg in June 2025  She wants to wait until June to do blood tests again as she feels really well  02/03/24 id clinic  assessment Had transitioned to cefadroxil   11/18/23 and finished 3 months rifampin  by 11/2023 No complaint today  Continue cefadroxil  1000 mg po bid until first week 03/2024; will consider transition to 500 mg po bid suppression there after  She inquires about trial off abx again and I discussed no labs will be able to  tell us  that she is cured until we go off. Will discuss this issue again at 1-2 years out  She does have a fluctuance but nontender area right inferior to the incision -- she sees dr Joshua again this week  Crp, cbc, bmp today  Follow up in end of September 2025    -------------- 05/11/24 id clinic assessment Patient recently increased activities and increased lower back pain No obvious red flag No wound dehiscence or fluctance on exam Tolerating cefadroxil  1gram bid She feels great otherwise   Will lower to 500 mg po bid with cefadroxil   f/u nsg F/u with me in 3 months    Follow-up: Return in about 3 months (around 08/11/2024).      Constance ONEIDA Passer, MD [facility] 05/11/2024, 4:04 PM

## 2024-05-11 NOTE — Patient Instructions (Signed)
 I agree with you overall much better  Maybe pain due to increased activities. See what your nsg says   Labs today  I am comfortable decreasing your antbiotics dosing to 500 mg twice a day   See me in 3 months

## 2024-05-12 LAB — COMPLETE METABOLIC PANEL WITHOUT GFR
AG Ratio: 1.9 (calc) (ref 1.0–2.5)
ALT: 15 U/L (ref 6–29)
AST: 19 U/L (ref 10–35)
Albumin: 4.5 g/dL (ref 3.6–5.1)
Alkaline phosphatase (APISO): 103 U/L (ref 37–153)
BUN: 22 mg/dL (ref 7–25)
CO2: 26 mmol/L (ref 20–32)
Calcium: 9.5 mg/dL (ref 8.6–10.4)
Chloride: 104 mmol/L (ref 98–110)
Creat: 0.82 mg/dL (ref 0.50–1.05)
Globulin: 2.4 g/dL (ref 1.9–3.7)
Glucose, Bld: 86 mg/dL (ref 65–99)
Potassium: 3.9 mmol/L (ref 3.5–5.3)
Sodium: 140 mmol/L (ref 135–146)
Total Bilirubin: 0.4 mg/dL (ref 0.2–1.2)
Total Protein: 6.9 g/dL (ref 6.1–8.1)

## 2024-05-12 LAB — CBC
HCT: 42.6 % (ref 35.0–45.0)
Hemoglobin: 14.1 g/dL (ref 11.7–15.5)
MCH: 29.4 pg (ref 27.0–33.0)
MCHC: 33.1 g/dL (ref 32.0–36.0)
MCV: 88.8 fL (ref 80.0–100.0)
MPV: 11.4 fL (ref 7.5–12.5)
Platelets: 311 Thousand/uL (ref 140–400)
RBC: 4.8 Million/uL (ref 3.80–5.10)
RDW: 12.3 % (ref 11.0–15.0)
WBC: 8.6 Thousand/uL (ref 3.8–10.8)

## 2024-05-12 LAB — C-REACTIVE PROTEIN: CRP: 5.8 mg/L (ref <8.0)

## 2024-05-20 DIAGNOSIS — Z6837 Body mass index (BMI) 37.0-37.9, adult: Secondary | ICD-10-CM | POA: Diagnosis not present

## 2024-05-20 DIAGNOSIS — M4316 Spondylolisthesis, lumbar region: Secondary | ICD-10-CM | POA: Diagnosis not present

## 2024-05-20 DIAGNOSIS — M48062 Spinal stenosis, lumbar region with neurogenic claudication: Secondary | ICD-10-CM | POA: Diagnosis not present

## 2024-06-07 NOTE — Therapy (Incomplete)
 OUTPATIENT PHYSICAL THERAPY THORACOLUMBAR EVALUATION   Patient Name: Dawn Mcguire MRN: [id number] DOB:1965-09-20, 58 y.o., female Today's Date: 06/07/2024  END OF SESSION:   Past Medical History:  Diagnosis Date   Arthritis    Carpal tunnel syndrome of right wrist    Closed displaced trimalleolar fracture of right ankle 11/20/2018   GERD (gastroesophageal reflux disease)    Psoriasis    Past Surgical History:  Procedure Laterality Date   ABDOMINAL HYSTERECTOMY     BLADDER REPAIR     BREAST SURGERY     CARPAL TUNNEL RELEASE     on the right wrist   CHOLECYSTECTOMY     INSERTION OF MESH N/A 10/16/2017   Procedure: INSERTION OF MESH;  Surgeon: Belinda Cough, MD;  Location: [facility] OR;  Service: General;  Laterality: N/A;   IR US  GUIDE BX ASP/DRAIN  12/31/2017   LUMBAR WOUND DEBRIDEMENT N/A 09/22/2023   Procedure: Irrigation and debridement of lumbar wound;  Surgeon: Joshua Alm Hamilton, MD;  Location: [facility] OR;  Service: Neurosurgery;  Laterality: N/A;   ORIF ANKLE FRACTURE Right 11/20/2018   Procedure: OPEN REDUCTION INTERNAL FIXATION (ORIF) ANKLE FRACTURE AND SYNDESMOSIS;  Surgeon: Josefina Chew, MD;  Location: [facility];  Service: Orthopedics;  Laterality: Right;   POSTERIOR FUSION PEDICLE SCREW PLACEMENT N/A 08/29/2023   Procedure: PLIF - L4-L5 - Posterior Lateral and Interbody fusion;  Surgeon: Joshua Alm Hamilton, MD;  Location: [facility] OR;  Service: Neurosurgery;  Laterality: N/A;   VENTRAL HERNIA REPAIR N/A 10/16/2017   Procedure: OPEN VENTRAL HERNIA REPAIR WITH MESH;  Surgeon: Belinda Cough, MD;  Location: [facility] OR;  Service: General;  Laterality: N/A;   Patient Active Problem List   Diagnosis Date Noted   Hardware complicating wound infection 09/24/2023   Wound infection after surgery 09/22/2023   S/P lumbar fusion 08/29/2023   Non-recurrent acute suppurative otitis media of both ears without spontaneous rupture of tympanic membranes 06/13/2023   Acute MEE (middle ear  effusion), bilateral 06/13/2023   Acute swimmer's ear of both sides 06/13/2023   History of urethral stricture 04/11/2022   Joint pain 09/27/2021   Neoplasm of uncertain behavior of skin 09/27/2021   Psoriasis 09/27/2021   Thinning hair 09/27/2021   Obesity (BMI 35.0-39.9 without comorbidity) 09/04/2021   Medication refill 07/19/2021   Other headache syndrome 07/19/2021   Other fatigue 07/19/2021   Acute cough 07/19/2021   Acute cystitis without hematuria 03/14/2021   Morbid obesity (HCC) 12/08/2020   Acute non-recurrent maxillary sinusitis 10/02/2020   Anal fissure 06/07/2019   Hematuria 06/07/2019   Left foot pain 02/11/2019   Closed displaced trimalleolar fracture of right ankle 11/20/2018   Nonspecific syndrome suggestive of viral illness 11/16/2018   Bad odor of urine 06/08/2018   Acute non-recurrent frontal sinusitis 06/08/2018   Suprapubic pressure 06/08/2018   Eustachian tube dysfunction, right 06/08/2018   Drug allergy, multiple 06/08/2018   Rhus dermatitis 01/20/2018   Dysuria 07/31/2017   Frequent UTI 07/31/2017   Abdominal pain 03/31/2017   Hx laparoscopic cholecystectomy 03/31/2017   Calculus of gallbladder without cholecystitis without obstruction 03/26/2017   Family history of colonic polyps 10/02/2016   Routine health maintenance 09/17/2012    PCP: Berneta Elsie Sayre, MD   REFERRING PROVIDER: Joshua Alm Hamilton, MD   REFERRING DIAG: [phone] (ICD-10-CM) - Spinal stenosis, lumbar region with neurogenic claudication  THERAPY DIAG:  No diagnosis found.  RATIONALE FOR EVALUATION AND TREATMENT: {HABREHAB:27488}  ONSET DATE: ***  NEXT MD VISIT: ***  SUBJECTIVE:                                                                                                                                                                                                         SUBJECTIVE STATEMENT: 58 y/o female referred to PT from Dr Jones[facility] for  spinal stenosis.   Patient had an L4/5 PLIF 08/29/23 and unfortunately had post op infection requiring I&D with wound vac placement in 2/25.   She has continued to follow with infectious dz and be on antibiotics since that time.   Her surgical incision is fully closed and has been for a while now.   She may be on abx for another year or 2 per her report.     PAIN: Are you having pain? Yes: NPRS scale: *** Pain location: *** Pain description: *** Aggravating factors: *** Relieving factors: ***  PERTINENT HISTORY:  ***  PRECAUTIONS: {Therapy precautions:24002}  RED FLAGS: {PT Red Flags:29287}  WEIGHT BEARING RESTRICTIONS: {Yes ***/No:24003}  FALLS:  Has patient fallen in last 6 months? {fallsyesno:27318}  LIVING ENVIRONMENT: Lives with: {OPRC lives with:25569::lives with their family} Lives in: {Lives in:25570} Stairs: {opstairs:27293} Has following equipment at home: {Assistive devices:23999}  OCCUPATION: ***  PLOF: {PLOF:24004}  PATIENT GOALS: ***   OBJECTIVE: (objective measures completed at initial evaluation unless otherwise dated)  DIAGNOSTIC FINDINGS:  ***  PATIENT SURVEYS:  {rehab surveys:24030}  SCREENING FOR RED FLAGS: Bowel or bladder incontinence: {Yes/No:304960894} Spinal tumors: {Yes/No:304960894} Cauda equina syndrome: {Yes/No:304960894} Compression fracture: {Yes/No:304960894} Abdominal aneurysm: {Yes/No:304960894}  COGNITION:  Overall cognitive status: {cognition:24006}    SENSATION: {sensation:27233}  POSTURE:  {posture:25561}  PALPATION: ***  LUMBAR ROM:   {AROM/PROM:27142}  Eval  Flexion   Extension   Right lateral flexion   Left lateral flexion   Right rotation   Left rotation   (Blank rows = not tested)  MUSCLE LENGTH: Hamstrings: Right *** deg; Left *** deg Thomas test: Right *** deg; Left *** deg Hamstrings: *** ITB: *** Piriformis: *** Hip flexors: *** Quads: *** Heelcord: ***  LOWER EXTREMITY ROM:      {AROM/PROM:27142}  Right eval Left eval  Hip flexion    Hip extension    Hip abduction    Hip adduction    Hip internal rotation    Hip external rotation    Knee flexion    Knee extension    Ankle dorsiflexion    Ankle plantarflexion    Ankle inversion    Ankle eversion    (Blank rows = not tested)  LOWER EXTREMITY MMT:  MMT Right eval Left eval  Hip flexion    Hip extension    Hip abduction    Hip adduction    Hip internal rotation    Hip external rotation    Knee flexion    Knee extension    Ankle dorsiflexion    Ankle plantarflexion    Ankle inversion    Ankle eversion     (Blank rows = not tested)  LUMBAR SPECIAL TESTS:  {lumbar special test:25242}  FUNCTIONAL TESTS:  {Functional tests:24029}  GAIT: Distance walked: *** Assistive device utilized: {Assistive devices:23999} Level of assistance: {Levels of assistance:24026} Gait pattern: {gait characteristics:25376} Comments: ***   TODAY'S TREATMENT:   ***   PATIENT EDUCATION:  Education details: {Education details:27468}  Person educated: {Person educated:25204} Education method: {Education Method EU:67125} Education comprehension: {Education Comprehension:25206}  HOME EXERCISE PROGRAM: ***   ASSESSMENT:  CLINICAL IMPRESSION: Aairah Negrette is a 58 y.o. female who was referred to physical therapy for evaluation and treatment for ***.  ***   Patient reports onset of *** pain beginning ***. Pain is worse with ***.  Patient has deficits in *** ROM, *** LE flexibility, *** strength, abnormal posture, and TTP with abnormal muscle tension *** which are interfering with ADLs and are impacting quality of life.  On Modified Oswestry patient scored ***/50 demonstrating ***% or *** disability.  Yemaya will benefit from skilled PT to address above deficits to improve mobility and activity tolerance with decreased pain interference.   ***  OBJECTIVE IMPAIRMENTS: {opptimpairments:25111}.   ACTIVITY  LIMITATIONS: {activitylimitations:27494}  PARTICIPATION LIMITATIONS: {participationrestrictions:25113}  PERSONAL FACTORS: {Personal factors:25162} are also affecting patient's functional outcome.   REHAB POTENTIAL: {rehabpotential:25112}  CLINICAL DECISION MAKING: {clinical decision making:25114}  EVALUATION COMPLEXITY: {Evaluation complexity:25115}   GOALS: Goals reviewed with patient? {yes/no:20286}  SHORT TERM GOALS: Target date: ***  Patient will be independent with initial HEP to improve outcomes and carryover.  Baseline: *** Goal status: {GOALSTATUS:25110}  2.  Patient will report 25% improvement in low back pain to improve QOL. Baseline: *** Goal status: {GOALSTATUS:25110}  3.  *** Baseline:  Goal status: {GOALSTATUS:25110}   LONG TERM GOALS: Target date: ***  Patient will be independent with ongoing/advanced HEP for self-management at home.  Baseline: *** Goal status: {GOALSTATUS:25110}  2.  Patient will report 50-75% improvement in low back pain to improve QOL.  Baseline: *** Goal status: {GOALSTATUS:25110}  3.  Patient to demonstrate ability to achieve and maintain good spinal alignment/posturing and body mechanics needed for daily activities. Baseline: *** Goal status: {GOALSTATUS:25110}  4.  Patient will demonstrate full pain free lumbar ROM to perform ADLs.   Baseline: Refer to above lumbar ROM table Goal status: {GOALSTATUS:25110}  5.  Patient will demonstrate improved *** strength to >/= ***/5 for improved stability and ease of mobility. Baseline: Refer to above LE MMT table Goal status: {GOALSTATUS:25110}  6. Patient will report </= ***% on Modified Oswestry (MCID = 12%) to demonstrate improved functional ability with decreased pain interference. Baseline: *** Goal status: {GOALSTATUS:25110}  7.  Patient will tolerate *** min of (standing/sitting/walking) w/o increased pain to allow for *** improved mobility and activity tolerance. Baseline:  *** Goal status: {GOALSTATUS:25110}  8.  Patient will report centralization of radicular symptoms.  Baseline: *** Goal status: {GOALSTATUS:25110}  9.  Patient will ***.  Baseline: *** Goal status: {GOALSTATUS:25110}   10.  *** Baseline: *** Goal status: {GOALSTATUS:25110}    PLAN:  PT FREQUENCY: {rehab frequency:25116}  PT DURATION: {rehab duration:25117}  PLANNED INTERVENTIONS: {rehab planned  interventions:25118::97110-Therapeutic exercises,97530- Therapeutic [phone]- Neuromuscular re-education,97535- Self Rjmz,02859- Manual therapy,Patient/Family education}  PLAN FOR NEXT SESSION: PIERRETTE RED SENIOR, PT 06/07/2024, 9:41 PM

## 2024-06-08 ENCOUNTER — Ambulatory Visit: Admitting: Rehabilitation

## 2024-07-19 ENCOUNTER — Ambulatory Visit: Payer: Self-pay

## 2024-07-19 ENCOUNTER — Emergency Department (HOSPITAL_BASED_OUTPATIENT_CLINIC_OR_DEPARTMENT_OTHER)
Admission: EM | Admit: 2024-07-19 | Discharge: 2024-07-19 | Disposition: A | Discharge status: Home / Self Care | Attending: Emergency Medicine | Admitting: Emergency Medicine

## 2024-07-19 ENCOUNTER — Emergency Department (HOSPITAL_BASED_OUTPATIENT_CLINIC_OR_DEPARTMENT_OTHER)

## 2024-07-19 ENCOUNTER — Encounter (HOSPITAL_BASED_OUTPATIENT_CLINIC_OR_DEPARTMENT_OTHER): Payer: Self-pay | Admitting: Emergency Medicine

## 2024-07-19 ENCOUNTER — Other Ambulatory Visit: Payer: Self-pay

## 2024-07-19 DIAGNOSIS — N200 Calculus of kidney: Secondary | ICD-10-CM | POA: Diagnosis not present

## 2024-07-19 DIAGNOSIS — N23 Unspecified renal colic: Secondary | ICD-10-CM

## 2024-07-19 DIAGNOSIS — R10A1 Flank pain, right side: Secondary | ICD-10-CM | POA: Diagnosis present

## 2024-07-19 DIAGNOSIS — Z91040 Latex allergy status: Secondary | ICD-10-CM | POA: Insufficient documentation

## 2024-07-19 LAB — CBC WITH DIFFERENTIAL/PLATELET
Abs Immature Granulocytes: 0.03 K/uL (ref 0.00–0.07)
Basophils Absolute: 0.1 K/uL (ref 0.0–0.1)
Basophils Relative: 1 %
Eosinophils Absolute: 0.2 K/uL (ref 0.0–0.5)
Eosinophils Relative: 2 %
HCT: 42.7 % (ref 36.0–46.0)
Hemoglobin: 14.1 g/dL (ref 12.0–15.0)
Immature Granulocytes: 0 %
Lymphocytes Relative: 29 %
Lymphs Abs: 3 K/uL (ref 0.7–4.0)
MCH: 29.3 pg (ref 26.0–34.0)
MCHC: 33 g/dL (ref 30.0–36.0)
MCV: 88.6 fL (ref 80.0–100.0)
Monocytes Absolute: 0.9 K/uL (ref 0.1–1.0)
Monocytes Relative: 9 %
Neutro Abs: 6.1 K/uL (ref 1.7–7.7)
Neutrophils Relative %: 59 %
Platelets: 148 K/uL — ABNORMAL LOW (ref 150–400)
RBC: 4.82 MIL/uL (ref 3.87–5.11)
RDW: 13.3 % (ref 11.5–15.5)
WBC: 10.3 K/uL (ref 4.0–10.5)
nRBC: 0 % (ref 0.0–0.2)

## 2024-07-19 LAB — URINALYSIS, MICROSCOPIC (REFLEX)

## 2024-07-19 LAB — COMPREHENSIVE METABOLIC PANEL WITH GFR
ALT: 20 U/L (ref 0–44)
AST: 26 U/L (ref 15–41)
Albumin: 4.4 g/dL (ref 3.5–5.0)
Alkaline Phosphatase: 111 U/L (ref 38–126)
Anion gap: 14 (ref 5–15)
BUN: 18 mg/dL (ref 6–20)
CO2: 24 mmol/L (ref 22–32)
Calcium: 9.5 mg/dL (ref 8.9–10.3)
Chloride: 104 mmol/L (ref 98–111)
Creatinine, Ser: 0.71 mg/dL (ref 0.44–1.00)
GFR, Estimated: >60 mL/min
Glucose, Bld: 86 mg/dL (ref 70–99)
Potassium: 3.5 mmol/L (ref 3.5–5.1)
Sodium: 141 mmol/L (ref 135–145)
Total Bilirubin: 0.3 mg/dL (ref 0.0–1.2)
Total Protein: 6.9 g/dL (ref 6.5–8.1)

## 2024-07-19 LAB — URINALYSIS, ROUTINE W REFLEX MICROSCOPIC
Bilirubin Urine: NEGATIVE
Glucose, UA: NEGATIVE mg/dL
Ketones, ur: NEGATIVE mg/dL
Leukocytes,Ua: NEGATIVE
Nitrite: NEGATIVE
Protein, ur: NEGATIVE mg/dL
Specific Gravity, Urine: >=1.03 (ref 1.005–1.030)
pH: 5.5 (ref 5.0–8.0)

## 2024-07-19 MED ORDER — OXYCODONE HCL 5 MG PO TABS
5.0000 mg | ORAL_TABLET | Freq: Once | ORAL | Status: AC
  Administered 2024-07-19: 5 mg via ORAL
  Filled 2024-07-19: qty 1

## 2024-07-19 NOTE — Telephone Encounter (Signed)
 Noted. Patient to go to urgent care.

## 2024-07-19 NOTE — ED Triage Notes (Signed)
 Pt reports rt sided flank pain. Hx kidney stones. Reports she did notice blood in urine a few days ago & urinary frequency.

## 2024-07-19 NOTE — Discharge Instructions (Signed)
 As we discussed you likely passed a kidney stone on the right side  You still have a kidney stone inside the left kidney  Please take your oxycodone  as prescribed by your doctor  Follow-up with urologist  Return to ER if you have worse abdominal pain or vomiting or fever

## 2024-07-19 NOTE — Telephone Encounter (Signed)
 FYI Only or Action Required?: FYI only for provider: pt to go to urgent care at 1400 today.  Patient was last seen in primary care on 10/14/2023 by [name], MD.  Called Nurse Triage reporting Back Pain.  Symptoms began a week ago.  Interventions attempted: OTC medications: ibuprofen and tylenol .  Symptoms are: gradually worsening.  Triage Disposition: See HCP Within 4 Hours (Or PCP Triage)  Patient/caregiver understands and will follow disposition?: Yes  Copied from CRM [id number]. Topic: Clinical - Red Word Triage >> Jul 19, 2024  1:05 PM Alfonso ORN wrote: Red Word that prompted transfer to Nurse Triage: alot pain in back on right side moving up to middle and upper back, blood in urine suspects kidney infection or UTI Reason for Disposition  Side (flank) or lower back pain present  Answer Assessment - Initial Assessment Questions 1. SYMPTOM: What's the main symptom you're concerned about? (e.g., frequency, incontinence)     Sharp, persistent, throbbing mid-back pain, hurts at rest and with movement  2. ONSET: When did the  pain  start?     1 week ago  3. PAIN: Is there any pain? If Yes, ask: How bad is it? (Scale: 1-10; mild, moderate, severe)     8/10, reports she has been taking tylenol  and advil for pain; states neither is helping.   4. CAUSE: What do you think is causing the symptoms?     Pt suspects bladder infection; although denies dysuria but does report urgency and frequency  5. OTHER SYMPTOMS: Do you have any other symptoms? (e.g., blood in urine, fever, flank pain, pain with urination) Hematuria, R sided mid-back pain, bloating, denies dysuria  Protocols used: Urinary Symptoms-A-AH

## 2024-07-19 NOTE — ED Notes (Signed)
 Pt. Reports pain in the R side back and also has had kidney stones in past.

## 2024-07-19 NOTE — ED Provider Notes (Signed)
 " Dawn Mcguire Provider Note   CSN: [id number] Arrival date & time: 07/19/24  1825     Patient presents with: Flank Pain   Dawn Mcguire is a 58 y.o. female history of vertebral osteomyelitis on Duricef, here presenting with right flank pain.  Patient had right flank pain for the last 2 days.  She states that she had some blood in her urine.  She is concerned that she may have a kidney stone or a kidney infection.  Denies any fevers or chills.  She states that she is still taking Duricef and denies any fevers   The history is provided by the patient.       Prior to Admission medications  Medication Sig Start Date End Date Taking? Authorizing Provider  acetaminophen  (TYLENOL ) 500 MG tablet Take 1,000 mg by mouth 2 (two) times daily.    [provider]  amLODipine  (NORVASC ) 2.5 MG tablet Take 1 tablet (2.5 mg total) by mouth daily. Patient not taking: Reported on 05/11/2024 11/18/23 02/03/24  [name], MD  Ascorbic Acid  (VITAMIN C  PO) Take by mouth.    [provider]  Biotin  1 MG CAPS Take 1 mg by mouth daily.    [provider]  cefadroxil  (DURICEF) 500 MG capsule Take by mouth. 04/16/24   [provider]  Cyanocobalamin (B-12 PO) Take 1 capsule by mouth daily.    [provider]  cyclobenzaprine  (FLEXERIL ) 5 MG tablet Use 1 to 2 tablets (5 to 10 mg) nightly as needed for muscle spasms. 08/29/23   Meyran, Suzen Lacks, NP  Multiple Vitamins-Minerals (ZINC  PO) Take by mouth.    [provider]  VITAMIN D PO Take by mouth.    [provider]    Allergies: Latex, Sulfa antibiotics, Sulfasalazine, Tape, Corticosteroids, Nitrofurantoin monohyd macro, and Penicillins    Review of Systems  Genitourinary:  Positive for flank pain.  All other systems reviewed and are negative.   Updated Vital Signs BP (!) 161/99 (BP Location: Right Arm)   Pulse 72   Temp 97.8 F  (36.6 C)   Resp 18   SpO2 96%   Physical Exam Vitals and nursing note reviewed.  Constitutional:      Appearance: Normal appearance.  HENT:     Head: Normocephalic.     Nose: Nose normal.     Mouth/Throat:     Mouth: Mucous membranes are moist.  Eyes:     Extraocular Movements: Extraocular movements intact.     Pupils: Pupils are equal, round, and reactive to light.  Cardiovascular:     Rate and Rhythm: Normal rate and regular rhythm.     Pulses: Normal pulses.  Pulmonary:     Effort: Pulmonary effort is normal.     Breath sounds: Normal breath sounds.  Abdominal:     General: Abdomen is flat.     Palpations: Abdomen is soft.     Comments: Mild right CVA tenderness.  Musculoskeletal:        General: Normal range of motion.     Cervical back: Normal range of motion and neck supple.     Comments: Right CVA tenderness versus right paralumbar tenderness  Skin:    General: Skin is warm.     Capillary Refill: Capillary refill takes less than 2 seconds.  Neurological:     General: No focal deficit present.     Mental Status: She is alert and oriented to person, place, and  time.     Comments: No saddle anesthesia  Psychiatric:        Mood and Affect: Mood normal.        Behavior: Behavior normal.     (all labs ordered are listed, but only abnormal results are displayed) Labs Reviewed  URINALYSIS, ROUTINE W REFLEX MICROSCOPIC  CBC WITH DIFFERENTIAL/PLATELET  COMPREHENSIVE METABOLIC PANEL WITH GFR    EKG: None  Radiology: No results found.   Procedures   Medications Ordered in the ED  oxyCODONE  (Oxy IR/ROXICODONE ) immediate release tablet 5 mg (has no administration in time range)                                    Medical Decision Making ANIQUE Mcguire is a 58 y.o. female here with right flank pain and blood in her urine.  Consider renal colic versus pyelonephritis.  Plan to get CBC CMP and CT renal stone.  Of note she does have history of vertebral  osteomyelitis but has no fever and no neurodeficits in the lower extremities.  She has been managed with infectious disease outpatient and I do not think she needs an MRI with contrast currently to rule out worsening osteomyelitis.  10:40 PM Reviewed patient's labs and white blood cell count is 10.  UA showed trace blood and rare bacteria but no signs of infection.  CT abdomen pelvis showed punctate stone in the left kidney.  Patient likely passed a kidney stone.  Pain is under control with oral pain meds.  Patient has oxycodone  prescription at home.  Stable for discharge.  Problems Addressed: Renal colic on right side: acute illness or injury  Amount and/or Complexity of Data Reviewed Labs: ordered. Decision-making details documented in ED Course. Radiology: ordered and independent interpretation performed. Decision-making details documented in ED Course.  Risk Prescription drug management.    Final diagnoses:  None    ED Discharge Orders     None          [name], MD 07/19/24 2240  "

## 2024-08-23 ENCOUNTER — Telehealth: Admitting: Physician Assistant

## 2024-08-23 DIAGNOSIS — J101 Influenza due to other identified influenza virus with other respiratory manifestations: Secondary | ICD-10-CM | POA: Diagnosis not present

## 2024-08-23 MED ORDER — OSELTAMIVIR PHOSPHATE 75 MG PO CAPS: 75.0000 mg | ORAL_CAPSULE | Freq: Two times a day (BID) | ORAL | 0 refills | Status: AC

## 2024-08-23 NOTE — Patient Instructions (Signed)
 " Dawn Mcguire, thank you for joining Delon CHRISTELLA Dickinson, PA-C for today's virtual visit.  While this provider is not your primary care provider (PCP), if your PCP is located in our provider database this encounter information will be shared with them immediately following your visit.   A [facility] MyChart account gives you access to today's visit and all your visits, tests, and labs performed at [facility]  click here if you don't have a [facility] MyChart account or go to [email or web address]  Consent: (Patient) Dawn Mcguire provided verbal consent for this virtual visit at the beginning of the encounter.  Current Medications:  Current Outpatient Medications:    oseltamivir  (TAMIFLU ) 75 MG capsule, Take 1 capsule (75 mg total) by mouth 2 (two) times daily., Disp: 10 capsule, Rfl: 0   acetaminophen  (TYLENOL ) 500 MG tablet, Take 1,000 mg by mouth 2 (two) times daily., Disp: , Rfl:    amLODipine  (NORVASC ) 2.5 MG tablet, Take 1 tablet (2.5 mg total) by mouth daily. (Patient not taking: Reported on 05/11/2024), Disp: 30 tablet, Rfl: 0   Ascorbic Acid  (VITAMIN C  PO), Take by mouth., Disp: , Rfl:    Biotin  1 MG CAPS, Take 1 mg by mouth daily., Disp: , Rfl:    cefadroxil  (DURICEF) 500 MG capsule, Take by mouth., Disp: , Rfl:    Cyanocobalamin (B-12 PO), Take 1 capsule by mouth daily., Disp: , Rfl:    cyclobenzaprine  (FLEXERIL ) 5 MG tablet, Use 1 to 2 tablets (5 to 10 mg) nightly as needed for muscle spasms., Disp: 30 tablet, Rfl: 0   Multiple Vitamins-Minerals (ZINC  PO), Take by mouth., Disp: , Rfl:    VITAMIN D PO, Take by mouth., Disp: , Rfl:    Medications ordered in this encounter:  Meds ordered this encounter  Medications   oseltamivir  (TAMIFLU ) 75 MG capsule    Sig: Take 1 capsule (75 mg total) by mouth 2 (two) times daily.    Dispense:  10 capsule    Refill:  0    Supervising Provider:   BLAISE ALEENE KIDD [[id number]]     *If you need refills on other  medications prior to your next appointment, please contact your pharmacy*  Follow-Up: Call back or seek an in-person evaluation if the symptoms worsen or if the condition fails to improve as anticipated.  [facility] [phone]  Other Instructions Influenza, Adult Influenza is also called the flu. It's an infection that affects your respiratory tract. This includes your nose, throat, windpipe, and lungs. The flu is contagious. This means it spreads easily from person to person. It causes symptoms that are like a cold. It can also cause a high fever and body aches. What are the causes? The flu is caused by the influenza virus. You can get it by: Breathing in droplets that are in the air after an infected person coughs or sneezes. Touching something that has the virus on it and then touching your mouth, nose, or eyes. What increases the risk? You may be more likely to get the flu if: You don't wash your hands often. You're near a lot of people during cold and flu season. You touch your mouth, eyes, or nose without washing your hands first. You don't get a flu shot each year. You may also be more at risk for the flu and serious problems, such as a lung infection called pneumonia, if: You're older than 65. You're pregnant. Your immune system is weak. Your immune system  is your body's defense system. You have a long-term, or chronic, condition, such as: Heart, kidney, or lung disease. Diabetes. A liver disorder. Asthma. You're very overweight. You have anemia. This is when you don't have enough red blood cells in your body. What are the signs or symptoms? Flu symptoms often start all of a sudden. They may last 4-14 days and include: Fever and chills. Headaches, body aches, or muscle aches. Sore throat. Cough. Runny or stuffy nose. Discomfort in your chest. Not wanting to eat as much as normal. Feeling weak or tired. Feeling dizzy. Nausea or vomiting. How is  this diagnosed? The flu may be diagnosed based on your symptoms and medical history. You may also have a physical exam. A swab may be taken from your nose or throat and tested for the virus. How is this treated? If the flu is found early, you can be treated with antiviral medicine. This may be given to you by mouth or through an IV. It can help you feel less sick and get better faster. Taking care of yourself at home can also help your symptoms get better. Your health care provider may tell you to: Take over-the-counter medicines. Drink lots of fluids. The flu often goes away on its own. If you have very bad symptoms or problems caused by the flu, you may need to be treated in a hospital. Follow these instructions at home: Activity Rest as needed. Get lots of sleep. Stay home from work or school as told by your provider. Leave home only to go see your provider. Do not leave home for other reasons until you don't have a fever for 24 hours without taking medicine. Eating and drinking Take an oral rehydration solution (ORS). This is a drink that is sold at pharmacies and stores. Drink enough fluid to keep your pee pale yellow. Try to drink small amounts of clear fluids. These include water, ice chips, fruit juice mixed with water, and low-calorie sports drinks. Try to eat bland foods that are easy to digest. These include bananas, applesauce, rice, lean meats, toast, and crackers. Avoid drinks that have a lot of sugar or caffeine in them. These include energy drinks, regular sports drinks, and soda. Do not drink alcohol. Do not eat spicy or fatty foods. General instructions     Take your medicines only as told by your provider. Use a cool mist humidifier to add moisture to the air in your home. This can make it easier for you to breathe. You should also clean the humidifier every day. To do so: Empty the water. Pour clean water in. Cover your mouth and nose when you cough or sneeze. Wash  your hands with soap and water often and for at least 20 seconds. It's extra important to do so after you cough or sneeze. If you can't use soap and water, use hand sanitizer. How is this prevented?  Get a flu shot every year. Ask your provider when you should get your flu shot. Stay away from people who are sick during fall and winter. Fall and winter are cold and flu season. Contact a health care provider if: You get new symptoms. You have chest pain. You have watery poop, also called diarrhea. You have a fever. Your cough gets worse. You start to have more mucus. You feel like you may vomit, or you vomit. Get help right away if: You become short of breath or have trouble breathing. Your skin or nails turn blue. You have very  bad pain or stiffness in your neck. You get a sudden headache or pain in your face or ear. You vomit each time you eat or drink. These symptoms may be an emergency. Call 911 right away. Do not wait to see if the symptoms will go away. Do not drive yourself to the hospital. This information is not intended to replace advice given to you by your health care provider. Make sure you discuss any questions you have with your health care provider. Document Revised: 04/17/2023 Document Reviewed: 08/22/2022 Elsevier Patient Education  2024 Elsevier Inc.   If you have been instructed to have an in-person evaluation today at a local Urgent Care facility, please use the link below. It will take you to a list of all of our available [facility], including address, phone number and hours of operation. Please do not delay care.  [facility]  If you or a family member do not have a primary care provider, use the link below to schedule a visit and establish care. When you choose a [facility] primary care physician or advanced practice provider, you gain a long-term partner in health. Find a Primary Care Provider  Learn more about [facility]'s  in-office and virtual care options: [facility] - Get Care Now "

## 2024-08-23 NOTE — Progress Notes (Signed)
 " Virtual Visit Consent   Dawn Mcguire, you are scheduled for a virtual visit with a [facility] provider today. Just as with appointments in the office, your consent must be obtained to participate. Your consent will be active for this visit and any virtual visit you may have with one of our providers in the next 365 days. If you have a MyChart account, a copy of this consent can be sent to you electronically.  As this is a virtual visit, video technology does not allow for your provider to perform a traditional examination. This may limit your provider's ability to fully assess your condition. If your provider identifies any concerns that need to be evaluated in person or the need to arrange testing (such as labs, EKG, etc.), we will make arrangements to do so. Although advances in technology are sophisticated, we cannot ensure that it will always work on either your end or our end. If the connection with a video visit is poor, the visit may have to be switched to a telephone visit. With either a video or telephone visit, we are not always able to ensure that we have a secure connection.  By engaging in this virtual visit, you consent to the provision of healthcare and authorize for your insurance to be billed (if applicable) for the services provided during this visit. Depending on your insurance coverage, you may receive a charge related to this service.  I need to obtain your verbal consent now. Are you willing to proceed with your visit today? Dawn Mcguire has provided verbal consent on 08/23/2024 for a virtual visit (video or telephone). Delon CHRISTELLA Dickinson, PA-C  Date: 08/23/2024 11:00 AM   Virtual Visit via Video Note   I, Delon CHRISTELLA Dickinson, connected with  Dawn Mcguire  ([id number], [birth year]) on 08/23/24 at 10:45 AM EST by a video-enabled telemedicine application and verified that I am speaking with the correct person using two identifiers.  Location: Patient: Virtual  Visit Location Patient: Home Provider: Virtual Visit Location Provider: Home Office   I discussed the limitations of evaluation and management by telemedicine and the availability of in person appointments. The patient expressed understanding and agreed to proceed.    History of Present Illness: Dawn Mcguire is a 59 y.o. who identifies as a female who was assigned female at birth, and is being seen today for flu-like symptoms with exposure.  HPI: URI  This is a new problem. The current episode started today. The problem has been unchanged. There has been no fever. Associated symptoms include congestion, coughing and headaches. Pertinent negatives include no diarrhea, ear pain, nausea, plugged ear sensation, rhinorrhea, sinus pain, sore throat or vomiting. Associated symptoms comments: myalgias. She has tried acetaminophen  (oscicillinum) for the symptoms. The treatment provided no relief.  Had flu A exposure from Son and daughter-in-law last week. Did not get flu vaccine   Problems:  Patient Active Problem List   Diagnosis Date Noted   Hardware complicating wound infection 09/24/2023   Wound infection after surgery 09/22/2023   S/P lumbar fusion 08/29/2023   Non-recurrent acute suppurative otitis media of both ears without spontaneous rupture of tympanic membranes 06/13/2023   Acute MEE (middle ear effusion), bilateral 06/13/2023   Acute swimmer's ear of both sides 06/13/2023   History of urethral stricture 04/11/2022   Joint pain 09/27/2021   Neoplasm of uncertain behavior of skin 09/27/2021   Psoriasis 09/27/2021   Thinning hair 09/27/2021   Obesity (BMI 35.0-39.9 without comorbidity)  09/04/2021   Medication refill 07/19/2021   Other headache syndrome 07/19/2021   Other fatigue 07/19/2021   Acute cough 07/19/2021   Acute cystitis without hematuria 03/14/2021   Morbid obesity (HCC) 12/08/2020   Acute non-recurrent maxillary sinusitis 10/02/2020   Anal fissure 06/07/2019    Hematuria 06/07/2019   Left foot pain 02/11/2019   Closed displaced trimalleolar fracture of right ankle 11/20/2018   Nonspecific syndrome suggestive of viral illness 11/16/2018   Bad odor of urine 06/08/2018   Acute non-recurrent frontal sinusitis 06/08/2018   Suprapubic pressure 06/08/2018   Eustachian tube dysfunction, right 06/08/2018   Drug allergy, multiple 06/08/2018   Rhus dermatitis 01/20/2018   Dysuria 07/31/2017   Frequent UTI 07/31/2017   Abdominal pain 03/31/2017   Hx laparoscopic cholecystectomy 03/31/2017   Calculus of gallbladder without cholecystitis without obstruction 03/26/2017   Family history of colonic polyps 10/02/2016   Routine health maintenance 09/17/2012    Allergies: Allergies[1] Medications: Current Medications[2]  Observations/Objective: Patient is well-developed, well-nourished in no acute distress.  Resting comfortably  Head is normocephalic, atraumatic.  No labored breathing.  Speech is clear and coherent with logical content.  Patient is alert and oriented at baseline.    Assessment and Plan: 1. Influenza A (Primary) - oseltamivir  (TAMIFLU ) 75 MG capsule; Take 1 capsule (75 mg total) by mouth 2 (two) times daily.  Dispense: 10 capsule; Refill: 0  - Suspect influenza due to symptoms and positive exposure - Tamiflu  prescribed - Continue OTC medication of choice for symptomatic management - Push fluids - Rest - Seek in person evaluation if symptoms worsen or fail to improve   Follow Up Instructions: I discussed the assessment and treatment plan with the patient. The patient was provided an opportunity to ask questions and all were answered. The patient agreed with the plan and demonstrated an understanding of the instructions.  A copy of instructions were sent to the patient via MyChart unless otherwise noted below.    The patient was advised to call back or seek an in-person evaluation if the symptoms worsen or if the condition fails to  improve as anticipated.    Delon CHRISTELLA Dickinson, PA-C     [1]  Allergies Allergen Reactions   Latex Rash    Tears skin and break out   Sulfa Antibiotics Itching and Nausea And Vomiting    Skin becomes red and feels like it is burning   Sulfasalazine Hives, Itching and Nausea And Vomiting    Skin becomes red and feels like it is burning    Tape     Tears skin. Patient prefers paper tape    Corticosteroids Hives, Itching and Rash    fever   Nitrofurantoin Monohyd Macro Nausea And Vomiting   Penicillins Hives, Itching and Rash        [2]  Current Outpatient Medications:    oseltamivir  (TAMIFLU ) 75 MG capsule, Take 1 capsule (75 mg total) by mouth 2 (two) times daily., Disp: 10 capsule, Rfl: 0   acetaminophen  (TYLENOL ) 500 MG tablet, Take 1,000 mg by mouth 2 (two) times daily., Disp: , Rfl:    amLODipine  (NORVASC ) 2.5 MG tablet, Take 1 tablet (2.5 mg total) by mouth daily. (Patient not taking: Reported on 05/11/2024), Disp: 30 tablet, Rfl: 0   Ascorbic Acid  (VITAMIN C  PO), Take by mouth., Disp: , Rfl:    Biotin  1 MG CAPS, Take 1 mg by mouth daily., Disp: , Rfl:    cefadroxil  (DURICEF) 500 MG capsule, Take by mouth., Disp: ,  Rfl:    Cyanocobalamin (B-12 PO), Take 1 capsule by mouth daily., Disp: , Rfl:    cyclobenzaprine  (FLEXERIL ) 5 MG tablet, Use 1 to 2 tablets (5 to 10 mg) nightly as needed for muscle spasms., Disp: 30 tablet, Rfl: 0   Multiple Vitamins-Minerals (ZINC  PO), Take by mouth., Disp: , Rfl:    VITAMIN D PO, Take by mouth., Disp: , Rfl:   "

## 2024-08-24 ENCOUNTER — Ambulatory Visit: Admitting: Internal Medicine

## 2024-09-02 ENCOUNTER — Ambulatory Visit: Admitting: Internal Medicine

## 2024-09-02 ENCOUNTER — Other Ambulatory Visit: Payer: Self-pay

## 2024-09-02 ENCOUNTER — Encounter: Payer: Self-pay | Admitting: Internal Medicine

## 2024-09-02 VITALS — BP 148/93 | HR 82 | Temp 98.2°F | Resp 16 | Wt 257.8 lb

## 2024-09-02 DIAGNOSIS — M4620 Osteomyelitis of vertebra, site unspecified: Secondary | ICD-10-CM

## 2024-09-02 DIAGNOSIS — B9689 Other specified bacterial agents as the cause of diseases classified elsewhere: Secondary | ICD-10-CM

## 2024-09-02 DIAGNOSIS — T847XXD Infection and inflammatory reaction due to other internal orthopedic prosthetic devices, implants and grafts, subsequent encounter: Secondary | ICD-10-CM

## 2024-09-02 MED ORDER — CEFADROXIL 500 MG PO CAPS: 500.0000 mg | ORAL_CAPSULE | Freq: Two times a day (BID) | ORAL | 11 refills | Status: AC

## 2024-09-02 NOTE — Progress Notes (Signed)
 "       [facility]  Patient Active Problem List   Diagnosis Date Noted   Hardware complicating wound infection 09/24/2023   Wound infection after surgery 09/22/2023   S/P lumbar fusion 08/29/2023   Non-recurrent acute suppurative otitis media of both ears without spontaneous rupture of tympanic membranes 06/13/2023   Acute MEE (middle ear effusion), bilateral 06/13/2023   Acute swimmer's ear of both sides 06/13/2023   History of urethral stricture 04/11/2022   Joint pain 09/27/2021   Neoplasm of uncertain behavior of skin 09/27/2021   Psoriasis 09/27/2021   Thinning hair 09/27/2021   Obesity (BMI 35.0-39.9 without comorbidity) 09/04/2021   Medication refill 07/19/2021   Other headache syndrome 07/19/2021   Other fatigue 07/19/2021   Acute cough 07/19/2021   Acute cystitis without hematuria 03/14/2021   Morbid obesity (HCC) 12/08/2020   Acute non-recurrent maxillary sinusitis 10/02/2020   Anal fissure 06/07/2019   Hematuria 06/07/2019   Left foot pain 02/11/2019   Closed displaced trimalleolar fracture of right ankle 11/20/2018   Nonspecific syndrome suggestive of viral illness 11/16/2018   Bad odor of urine 06/08/2018   Acute non-recurrent frontal sinusitis 06/08/2018   Suprapubic pressure 06/08/2018   Eustachian tube dysfunction, right 06/08/2018   Drug allergy, multiple 06/08/2018   Rhus dermatitis 01/20/2018   Dysuria 07/31/2017   Frequent UTI 07/31/2017   Abdominal pain 03/31/2017   Hx laparoscopic cholecystectomy 03/31/2017   Calculus of gallbladder without cholecystitis without obstruction 03/26/2017   Family history of colonic polyps 10/02/2016   Routine health maintenance 09/17/2012      Subjective:    Patient ID: Dawn Mcguire, female    DOB: Dec 26, 1965, 59 y.o.   MRN: [id number]  Chief Complaint  Patient presents with   Follow-up    Vertebral osteomyelitis      HPI:  Dawn Mcguire is a 59 y.o. female here for  hospital f/u of lumbar spine fusion hardware surgical site related infection   10/23/23 id clinic first fu See a&p for more detail Wound site seroma after hospital discharge had ruptured (seroma seen on mri 3/06); pending I&D and reclosure next week Pain much better Inflammatory markers also improving Tolerating cefazolin  and oral rifampin  Blood pressure running a little high and started on amlodipine  low dose No other complaint   12/02/23 id clinic f/u Started abx 09/24/23 cefazolin /rifampin  Transitioned cefazolin  to cefadroxil  4/22 Planned rifampin  to end at the end of 11/2023 Doing well no complaint today 2 weeks ago did have to go to surgery for minor I&D of the incision as some clear drainage remained and reclosed --> all healed by now No cultures were sent as no sign of infection (Clear fluid/blood)   02/03/24 id clinic f/u Had one small superficial surgery to close the wound after 12/02/23 visit with me Intermittent lower back pain with rigorous activities No side effect from cefadroxil  See a&p for detail   05/11/24 id clinic f/u On cefadroxil  1000 mg bid 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 More back pain with those activities No f/c No n/v/d Seeing nsg next week No numbness/tingling/weakness in the feet  Bilateral pain radiating to bilateral hips and knees   09/02/24 id clinic f/u Saw neurosurgery f/u will need repeat f/u 09/2024 Tolerating cefadroxil  500 bid; decreased dose in 04/2024 No n/v/d/rash Back pain same as 04/2024 with some radiculopathy to right leg this time (left leg 04/2024) No incontinence  Right leg does drag and feel weaker  Allergies  Allergen Reactions   Latex Rash    Tears skin and break out   Sulfa Antibiotics Itching and Nausea And Vomiting    Skin becomes red and feels like it is burning   Sulfasalazine Hives, Itching and Nausea And Vomiting    Skin becomes red and feels like it is burning    Tape     Tears skin. Patient  prefers paper tape    Corticosteroids Hives, Itching and Rash    fever   Nitrofurantoin Monohyd Macro Nausea And Vomiting   Penicillins Hives, Itching and Rash            Outpatient Medications Prior to Visit  Medication Sig Dispense Refill   acetaminophen  (TYLENOL ) 500 MG tablet Take 1,000 mg by mouth 2 (two) times daily.     Ascorbic Acid  (VITAMIN C  PO) Take by mouth.     Biotin  1 MG CAPS Take 1 mg by mouth daily.     cefadroxil  (DURICEF) 500 MG capsule Take by mouth.     Cyanocobalamin (B-12 PO) Take 1 capsule by mouth daily.     cyclobenzaprine  (FLEXERIL ) 5 MG tablet Use 1 to 2 tablets (5 to 10 mg) nightly as needed for muscle spasms. 30 tablet 0   Multiple Vitamins-Minerals (ZINC  PO) Take by mouth.     oseltamivir  (TAMIFLU ) 75 MG capsule Take 1 capsule (75 mg total) by mouth 2 (two) times daily. 10 capsule 0   VITAMIN D PO Take by mouth.     amLODipine  (NORVASC ) 2.5 MG tablet Take 1 tablet (2.5 mg total) by mouth daily. (Patient not taking: Reported on 09/02/2024) 30 tablet 0   No facility-administered medications prior to visit.     Social History   Socioeconomic History   Marital status: Married    Spouse name: Not on file   Number of children: Not on file   Years of education: Not on file   Highest education level: Not on file  Occupational History   Not on file  Tobacco Use   Smoking status: Never   Smokeless tobacco: Never  Vaping Use   Vaping status: Never Used  Substance and Sexual Activity   Alcohol use: No   Drug use: No   Sexual activity: Yes    Birth control/protection: Surgical  Other Topics Concern   Not on file  Social History Narrative   Not on file   Social Drivers of Health   Tobacco Use: Low Risk (09/02/2024)   Patient History    Smoking Tobacco Use: Never    Smokeless Tobacco Use: Never    Passive Exposure: Not on file  Financial Resource Strain: Not on file  Food Insecurity: No Food Insecurity (09/23/2023)   Hunger Vital Sign     Worried About Running Out of Food in the Last Year: Never true    Ran Out of Food in the Last Year: Never true  Transportation Needs: No Transportation Needs (09/23/2023)   PRAPARE - Administrator, Civil Service (Medical): No    Lack of Transportation (Non-Medical): No  Physical Activity: Not on file  Stress: Not on file  Social Connections: Not on file  Intimate Partner Violence: Not At Risk (09/23/2023)   Humiliation, Afraid, Rape, and Kick questionnaire    Fear of Current or Ex-Partner: No    Emotionally Abused: No    Physically Abused: No    Sexually Abused: No  Depression (PHQ2-9): Low Risk (10/23/2023)   Depression (PHQ2-9)  PHQ-2 Score: 0  Alcohol Screen: Not on file  Housing: Low Risk (09/23/2023)   Housing Stability Vital Sign    Unable to Pay for Housing in the Last Year: No    Number of Times Moved in the Last Year: 0    Homeless in the Last Year: No  Utilities: Not At Risk (09/23/2023)   [employer] Utilities    Threatened with loss of utilities: No  Health Literacy: Not on file      Review of Systems    All other ros negative   Objective:    BP (!) 148/93   Pulse 82   Temp 98.2 F (36.8 C) (Oral)   Resp 16   Wt 257 lb 12.8 oz (116.9 kg)   SpO2 98%   BMI 39.20 kg/m  Nursing note and vital signs reviewed.  Physical Exam     General/constitutional: no distress, pleasant HEENT: Normocephalic, PER, Conj Clear, EOMI, Oropharynx clear Neck supple CV: rrr no mrg Lungs: clear to auscultation, normal respiratory effort Abd: Soft, Nontender Ext: no edema Skin: incision healed; there is a large soft/fluctuant collection just inferior end of incision; no tenderness/erythema/dehiscence MSK: no peripheral joint swelling/tenderness/warmth; back spines nontender   Labs: Lab Results  Component Value Date   WBC 10.3 07/19/2024   HGB 14.1 07/19/2024   HCT 42.7 07/19/2024   MCV 88.6 07/19/2024   PLT 148 (L) 07/19/2024   Last metabolic panel Lab  Results  Component Value Date   GLUCOSE 86 07/19/2024   NA 141 07/19/2024   K 3.5 07/19/2024   CL 104 07/19/2024   CO2 24 07/19/2024   BUN 18 07/19/2024   CREATININE 0.71 07/19/2024   GFRNONAA >60 07/19/2024   CALCIUM 9.5 07/19/2024   PROT 6.9 07/19/2024   ALBUMIN 4.4 07/19/2024   BILITOT 0.3 07/19/2024   ALKPHOS 111 07/19/2024   AST 26 07/19/2024   ALT 20 07/19/2024   ANIONGAP 14 07/19/2024     Micro:  Serology:  Imaging: Reviewed  10/03/23 mri lumbar spine 1. 5.8 x 7.8 x 9.4 cm peripherally enhancing subcutaneous fluid collection at the operative site likely represents a postoperative seroma. Infection is not excluded by imaging. The area could be sampled with superficial needle puncture to the right of midline. 2. Enhancing epidural granulation tissue at L4-5 and surrounding the L4 spinous process. This likely represents postoperative change. Deep infection is considered less likely. 3. Moderate central canal stenosis at L3-4 has progressed. Mild left foraminal narrowing is similar the prior exam. 4. Moderate facet hypertrophy at L5-S1 without focal disc protrusion or stenosis. 5. Mild facet hypertrophy and disc bulging at L2-3 without stenosis.  Assessment & Plan:   Problem List Items Addressed This Visit   None        Abx: 4/22-c cefadroxil  1000mg  bid --> 500 mg bid on 05/11/24   2/26-4/22/25 cefazolin  2/26-5/31/25 rifampin  300 mg po bid    59 yo female with lumbar spine hardware recent 09/22/23 admission for I&D of lumbar surgical site infection cx mssa (S bactrim, tetracycline)  Timeline: Lumbar fusion 08/27/18 I&D 09/22/23 -- debridement needed down to hardware; cx mssa   2/25 admission bcx negative  Patient started on cefazolin  and rifampin   10/23/23 id clinic assessment Reviewed opat labs 3/24 cr 0.69; alkphos 109; ast/alt 16/4; tbil 0.3; cbc 7/12.5/375 (normal diff) 3/24 sed rate 51; crp 7.7 (<5) 3/10 sed rate 73; crp 20.6 (<5)  Due  to early ssi infection we had decided to use rifampin  adjunctive The  fluid collection seen on mri 10/03/23 had ruptured and drain itself and dr Joshua had attempted to also aspirate in clinic 2 weeks prior (didn't do culture as barely anything remained) There are 2 spots of incision that hadn't approximated and there is planned surgery to clean out and close this coming week No fever, chill Pain is around 4-5/10, much better since surgery No n/v/diarrhea/rash with abx  Since she'll have surgery again, will extend cefazolin  for 2 weeks beyond the 4/7 date (until 11/17/23) Rifampin  will go until end of 11/2023  Follow up with us  5-6 weeks  Cefadroxil  1000 mg twice a day given to start on 11/18/23  Penicillin allergy -- 15-20 years ago hives. But she has taken amoxicillin after that and tolerate fine. So I do not believe she has penicillin allergy    12/02/23 id clinic assessment Reviewed outside labs 11/03/23 crp 6.6 (range <5); esr 34 11/10/23 crp < 5; esr 31; cr 0.6; lft 15/5/94/0.3; cbc 7.6/13.3/256 She has still some seroma/oozing and was taken to outpatient surgery for minor I&D with repeat closure Intermittent 3/10 pain in lower back  No abx side effect She'll see nsg in June 2025  She wants to wait until June to do blood tests again as she feels really well  02/03/24 id clinic assessment Had transitioned to cefadroxil   11/18/23 and finished 3 months rifampin  by 11/2023 No complaint today  Continue cefadroxil  1000 mg po bid until first week 03/2024; will consider transition to 500 mg po bid suppression there after  She inquires about trial off abx again and I discussed no labs will be able to tell us  that she is cured until we go off. Will discuss this issue again at 1-2 years out  She does have a fluctuance but nontender area right inferior to the incision -- she sees dr Joshua again this week  Crp, cbc, bmp today  Follow up in end of September 2025    -------------- 05/11/24 id  clinic assessment Patient recently increased activities and increased lower back pain No obvious red flag No wound dehiscence or fluctance on exam Tolerating cefadroxil  1gram bid She feels great otherwise   Will lower to 500 mg po bid with cefadroxil   f/u nsg F/u with me in 3 months   09/02/2024 id clinic assessment Ongoing radiculopathic sx; pending repeat mri and probable need for more lumbar decompression surgery No sign of active infection otherwise on suppressive cefadroxil  500 mg bid Refill meds  Labs done recently reviewed; no need to do today  Follow up 5 months   Follow-up: Return in about 5 months (around 01/30/2025).      Constance ONEIDA Passer, MD [facility] 09/02/2024, 4:22 PM  "

## 2024-09-02 NOTE — Patient Instructions (Signed)
 Continue cefadroxil    See us  in the summer

## 2025-02-03 ENCOUNTER — Ambulatory Visit: Payer: Self-pay | Admitting: Internal Medicine
